# Patient Record
Sex: Male | Born: 1945 | Race: White | Hispanic: No | Marital: Married | Smoking: Former smoker
Health system: Southern US, Community
[De-identification: ages and names within clinical notes are randomized; demographics above are authoritative.]

## PROBLEM LIST (undated history)

## (undated) DIAGNOSIS — E7800 Pure hypercholesterolemia, unspecified: Secondary | ICD-10-CM

## (undated) DIAGNOSIS — N400 Enlarged prostate without lower urinary tract symptoms: Secondary | ICD-10-CM

## (undated) DIAGNOSIS — Z8589 Personal history of malignant neoplasm of other organs and systems: Secondary | ICD-10-CM

## (undated) DIAGNOSIS — Z972 Presence of dental prosthetic device (complete) (partial): Secondary | ICD-10-CM

## (undated) DIAGNOSIS — Z85828 Personal history of other malignant neoplasm of skin: Secondary | ICD-10-CM

## (undated) DIAGNOSIS — M1990 Unspecified osteoarthritis, unspecified site: Secondary | ICD-10-CM

## (undated) DIAGNOSIS — K5792 Diverticulitis of intestine, part unspecified, without perforation or abscess without bleeding: Secondary | ICD-10-CM

## (undated) DIAGNOSIS — I10 Essential (primary) hypertension: Secondary | ICD-10-CM

## (undated) HISTORY — PX: INGUINAL HERNIA REPAIR: SUR1180

## (undated) HISTORY — DX: Personal history of malignant neoplasm of other organs and systems: Z85.89

## (undated) HISTORY — DX: Diverticulitis of intestine, part unspecified, without perforation or abscess without bleeding: K57.92

## (undated) HISTORY — DX: Personal history of other malignant neoplasm of skin: Z85.828

---

## 1993-03-10 DIAGNOSIS — E7800 Pure hypercholesterolemia, unspecified: Secondary | ICD-10-CM

## 1993-03-10 HISTORY — DX: Pure hypercholesterolemia, unspecified: E78.00

## 2006-04-09 ENCOUNTER — Ambulatory Visit: Payer: Self-pay | Admitting: Gastroenterology

## 2008-02-16 ENCOUNTER — Ambulatory Visit: Payer: Self-pay | Admitting: Family Medicine

## 2008-02-22 ENCOUNTER — Ambulatory Visit: Payer: Self-pay | Admitting: Family Medicine

## 2011-11-11 DIAGNOSIS — R972 Elevated prostate specific antigen [PSA]: Secondary | ICD-10-CM | POA: Insufficient documentation

## 2011-11-11 DIAGNOSIS — N401 Enlarged prostate with lower urinary tract symptoms: Secondary | ICD-10-CM | POA: Insufficient documentation

## 2011-11-11 DIAGNOSIS — R6882 Decreased libido: Secondary | ICD-10-CM | POA: Insufficient documentation

## 2013-02-10 ENCOUNTER — Ambulatory Visit: Payer: Self-pay | Admitting: Urology

## 2013-03-10 HISTORY — PX: INGUINAL HERNIA REPAIR: SUR1180

## 2013-06-23 DIAGNOSIS — R339 Retention of urine, unspecified: Secondary | ICD-10-CM | POA: Insufficient documentation

## 2013-08-16 DIAGNOSIS — R319 Hematuria, unspecified: Secondary | ICD-10-CM | POA: Insufficient documentation

## 2013-08-16 DIAGNOSIS — E785 Hyperlipidemia, unspecified: Secondary | ICD-10-CM | POA: Insufficient documentation

## 2013-08-16 DIAGNOSIS — K635 Polyp of colon: Secondary | ICD-10-CM | POA: Insufficient documentation

## 2013-08-30 ENCOUNTER — Ambulatory Visit: Payer: Self-pay | Admitting: Surgery

## 2013-09-06 ENCOUNTER — Ambulatory Visit: Payer: Self-pay | Admitting: Surgery

## 2014-01-12 ENCOUNTER — Ambulatory Visit: Payer: Self-pay | Admitting: Gastroenterology

## 2014-05-07 ENCOUNTER — Emergency Department: Payer: Self-pay | Admitting: Internal Medicine

## 2014-07-01 NOTE — Op Note (Signed)
PATIENT NAME:  George, Bruce MR#:  [id number] DATE OF BIRTH:  [birth year]  DATE OF PROCEDURE:  09/06/2013  PREOPERATIVE DIAGNOSIS: Right inguinal hernia.   POSTOPERATIVE DIAGNOSIS: Right inguinal hernia.   PROCEDURE: Right inguinal hernia repair.   SURGEON: Rochel Brome, MD   ANESTHESIA: General.   INDICATIONS: This 69 year old male came in with chief complaint of bulging in the right groin. He had some associated pain and hernia was demonstrated on physical exam and repair recommended for definitive treatment.   DESCRIPTION OF PROCEDURE: The patient was placed on the operating table in the supine position under general anesthesia. The abdomen and the right lower quadrant was clipped and prepared with ChloraPrep, draped in a sterile manner.   A right lower quadrant transversely oriented suprapubic incision was made, carried down through subcutaneous tissues. Electrocautery was used for hemostasis. One traversing vein was divided between 4-0 chromic suture ligatures. Scarpa's fascia was incised. The external ring was identified. The external oblique aponeurosis was incised along the course of its fibers to open the external ring and expose the inguinal cord structures. Cord structures were mobilized. The floor of the inguinal canal appeared to be intact. Cremaster fibers were spread to expose a large cord lipoma which was dissected free from surrounding structures. It was approximately 4 inches in length. A high ligation of the cord lipoma was done with a 4-0 Vicryl suture ligature. The cord structures were further explored, finding an indirect hernia sac which was approximately 5 cm in length.  It was followed up and dissected up into the internal ring. The sac did contain some omentum. The sac was opened. It appeared that omentum was attached to the inner surface of the sac. There was no bowel within the sac. The sac was ligated just above the attachment of the omentum, which appeared to have a  broad   attachment. This was suture ligated with 4-0 Vicryl. A portion of the sac was excised. The remainder was allowed to retract into the abdomen. Next, the repair was carried out with a row of 0 Surgilon sutures beginning at the acute pubic tubercle, suturing the conjoined tendon to the shelving edge of the inguinal ligament incorporating transversalis fascia into the repair. The last stitch led to satisfactory narrowing of the internal ring. An onlay Bard soft mesh was cut and created an oval shape of some 2.5 x 3.5 cm. A notch was cut out to straddle the cord structures around the internal ring. This was placed along the floor of the inguinal canal and was sutured to the repair with 0 Surgilon, also sutured medially to the fascia and on both sides of the internal ring.  Repair looked good. Hemostasis was intact. The cut edges of the external oblique aponeurosis were closed with a running 4-0 Vicryl. The deep fascia superior and lateral to the repair site was infiltrated with 0.5% Sensorcaine with epinephrine and subcutaneous tissues were infiltrated as well. There was a small amount of bleeding in the subcutaneous tissues which was medial and inferior.  Pressure was held and this site was cauterized and injected additional 0.5% Sensorcaine with epinephrine and hemostasis subsequently appeared to be intact. Scarpa's fascia was closed with interrupted 4-0 Monocryl. The skin was closed with running 4-0 Monocryl subcuticular suture and Dermabond. The testicle remained in the scrotum. The patient appeared to be in satisfactory condition and was prepared for a transfer to the recovery room.    ____________________________ Lenna Sciara. Rochel Brome, MD jws:dd D: 09/06/2013 13:46:30 ET T:  09/06/2013 19:55:50 ET JOB#: [id number]  cc: Loreli Dollar, MD, <Dictator> Loreli Dollar MD ELECTRONICALLY SIGNED 09/07/2013 19:51

## 2014-09-21 ENCOUNTER — Other Ambulatory Visit: Payer: Self-pay | Admitting: Internal Medicine

## 2014-09-21 DIAGNOSIS — R1310 Dysphagia, unspecified: Secondary | ICD-10-CM

## 2014-09-27 ENCOUNTER — Ambulatory Visit
Admission: RE | Admit: 2014-09-27 | Discharge: 2014-09-27 | Disposition: A | Discharge status: Home / Self Care | Payer: Medicare Other | Source: Ambulatory Visit | Attending: Internal Medicine | Admitting: Internal Medicine

## 2014-09-27 DIAGNOSIS — R1310 Dysphagia, unspecified: Secondary | ICD-10-CM

## 2014-09-27 DIAGNOSIS — R1013 Epigastric pain: Secondary | ICD-10-CM | POA: Insufficient documentation

## 2014-09-27 DIAGNOSIS — K449 Diaphragmatic hernia without obstruction or gangrene: Secondary | ICD-10-CM | POA: Diagnosis not present

## 2014-12-21 DIAGNOSIS — Z85828 Personal history of other malignant neoplasm of skin: Secondary | ICD-10-CM

## 2014-12-21 HISTORY — DX: Personal history of other malignant neoplasm of skin: Z85.828

## 2015-05-03 DIAGNOSIS — R361 Hematospermia: Secondary | ICD-10-CM | POA: Insufficient documentation

## 2015-10-01 ENCOUNTER — Other Ambulatory Visit: Payer: Self-pay | Admitting: Surgery

## 2015-10-01 DIAGNOSIS — M7582 Other shoulder lesions, left shoulder: Secondary | ICD-10-CM

## 2015-10-01 DIAGNOSIS — M75122 Complete rotator cuff tear or rupture of left shoulder, not specified as traumatic: Secondary | ICD-10-CM

## 2015-10-12 ENCOUNTER — Ambulatory Visit
Admission: RE | Admit: 2015-10-12 | Discharge: 2015-10-12 | Disposition: A | Discharge status: Home / Self Care | Payer: Medicare Other | Source: Ambulatory Visit | Attending: Surgery | Admitting: Surgery

## 2015-10-12 DIAGNOSIS — M75122 Complete rotator cuff tear or rupture of left shoulder, not specified as traumatic: Secondary | ICD-10-CM | POA: Diagnosis present

## 2015-10-12 DIAGNOSIS — S46892A Other injury of other muscles, fascia and tendons at shoulder and upper arm level, left arm, initial encounter: Secondary | ICD-10-CM | POA: Diagnosis not present

## 2015-10-12 DIAGNOSIS — M7582 Other shoulder lesions, left shoulder: Secondary | ICD-10-CM | POA: Insufficient documentation

## 2015-10-12 DIAGNOSIS — M25412 Effusion, left shoulder: Secondary | ICD-10-CM | POA: Diagnosis not present

## 2015-11-01 ENCOUNTER — Other Ambulatory Visit: Payer: Medicare Other

## 2015-11-02 ENCOUNTER — Encounter
Admission: RE | Admit: 2015-11-02 | Discharge: 2015-11-02 | Disposition: A | Discharge status: Home / Self Care | Payer: Medicare Other | Source: Ambulatory Visit | Attending: Surgery | Admitting: Surgery

## 2015-11-02 DIAGNOSIS — Z01810 Encounter for preprocedural cardiovascular examination: Secondary | ICD-10-CM | POA: Diagnosis present

## 2015-11-02 DIAGNOSIS — Z01812 Encounter for preprocedural laboratory examination: Secondary | ICD-10-CM | POA: Insufficient documentation

## 2015-11-02 DIAGNOSIS — E7800 Pure hypercholesterolemia, unspecified: Secondary | ICD-10-CM | POA: Diagnosis not present

## 2015-11-02 HISTORY — DX: Benign prostatic hyperplasia without lower urinary tract symptoms: N40.0

## 2015-11-02 HISTORY — DX: Unspecified osteoarthritis, unspecified site: M19.90

## 2015-11-02 HISTORY — DX: Pure hypercholesterolemia, unspecified: E78.00

## 2015-11-02 LAB — BASIC METABOLIC PANEL
ANION GAP: 7 (ref 5–15)
BUN: 20 mg/dL (ref 6–20)
CHLORIDE: 106 mmol/L (ref 101–111)
CO2: 26 mmol/L (ref 22–32)
Calcium: 9.1 mg/dL (ref 8.9–10.3)
Creatinine, Ser: 0.82 mg/dL (ref 0.61–1.24)
GFR calc Af Amer: >60 mL/min (ref >60)
GLUCOSE: 91 mg/dL (ref 65–99)
POTASSIUM: 4 mmol/L (ref 3.5–5.1)
Sodium: 139 mmol/L (ref 135–145)

## 2015-11-02 LAB — SURGICAL PCR SCREEN
MRSA, PCR: NEGATIVE
STAPHYLOCOCCUS AUREUS: POSITIVE — AB

## 2015-11-02 LAB — URINALYSIS COMPLETE WITH MICROSCOPIC (ARMC ONLY)
BILIRUBIN URINE: NEGATIVE
Bacteria, UA: NONE SEEN
Glucose, UA: NEGATIVE mg/dL
Hgb urine dipstick: NEGATIVE
KETONES UR: NEGATIVE mg/dL
LEUKOCYTES UA: NEGATIVE
Nitrite: NEGATIVE
PROTEIN: NEGATIVE mg/dL
SPECIFIC GRAVITY, URINE: 1.011 (ref 1.005–1.030)
SQUAMOUS EPITHELIAL / LPF: NONE SEEN
pH: 6 (ref 5.0–8.0)

## 2015-11-02 LAB — TYPE AND SCREEN
ABO/RH(D): O NEG
Antibody Screen: NEGATIVE

## 2015-11-02 LAB — CBC
HCT: 43.8 % (ref 40.0–52.0)
HEMOGLOBIN: 15 g/dL (ref 13.0–18.0)
MCH: 29.5 pg (ref 26.0–34.0)
MCHC: 34.4 g/dL (ref 32.0–36.0)
MCV: 86 fL (ref 80.0–100.0)
Platelets: 154 10*3/uL (ref 150–440)
RBC: 5.09 MIL/uL (ref 4.40–5.90)
RDW: 13.6 % (ref 11.5–14.5)
WBC: 6.2 10*3/uL (ref 3.8–10.6)

## 2015-11-02 LAB — PROTIME-INR
INR: 1.09
PROTHROMBIN TIME: 14.1 s (ref 11.4–15.2)

## 2015-11-02 NOTE — Pre-Procedure Instructions (Signed)
Pt has confirmed his change in surgery time to 1030 and will arrive at [facility] at 0930 on Monday 11/05/15.

## 2015-11-02 NOTE — Pre-Procedure Instructions (Signed)
Nasal swab results faxed to Dr. Nicholaus Bloom office, MRSA negative and Staph Aureus positive. Message left on Cindy's voicemail.

## 2015-11-02 NOTE — Patient Instructions (Signed)
  Your procedure is scheduled [id number] November 06, 2015. Report to Same Day Surgery. To find out your arrival time please call [phone] between 1PM - 3PM on Monday November 05, 2015.  Remember: Instructions that are not followed completely may result in serious medical risk, up to and including death, or upon the discretion of your surgeon and anesthesiologist your surgery may need to be rescheduled.    _x___ 1. Do not eat food or drink liquids after midnight. No gum chewing or hard candies.     _x___ 2. No Alcohol for 24 hours before or after surgery.   ____ 3. Bring all medications with you on the day of surgery if instructed.    __x__ 4. Notify your doctor if there is any change in your medical condition     (cold, fever, infections).     Do not wear jewelry, make-up, hairpins, clips or nail polish.  Do not wear lotions, powders, or perfumes. You may wear deodorant.  Do not shave 48 hours prior to surgery. Men may shave face and neck.  Do not bring valuables to the hospital.    [facility] is not responsible for any belongings or valuables.               Contacts, dentures or bridgework may not be worn into surgery.  Leave your suitcase in the car. After surgery it may be brought to your room.  For patients admitted to the hospital, discharge time is determined by your treatment team.   Patients discharged the day of surgery will not be allowed to drive home.    Please read over the following fact sheets that you were given:   [facility] Preparing for Surgery  ____ Take these medicines the morning of surgery with A SIP OF WATER: NONE     ____ Fleet Enema (as directed)   __x__ Use CHG Soap as directed on instruction sheet  ____ Use inhalers on the day of surgery and bring to hospital day of surgery  ____ Stop metformin 2 days prior to surgery    ____ Take 1/2 of usual insulin dose the night before surgery and none on the morning of surgery.   __x__ Pt has already  stopped aspirin on 10/28/15.  __x__ Stop Anti-inflammatories such as Advil, Aleve, Ibuprofen, Motrin, Naproxen, Naprosyn, Goodies powders or aspirin products.   ____ Stop supplements until after surgery.    ____ Bring C-Pap to the hospital.

## 2015-11-03 LAB — URINE CULTURE
Culture: NO GROWTH
Special Requests: NORMAL

## 2015-11-06 ENCOUNTER — Inpatient Hospital Stay: Payer: Medicare Other | Admitting: Anesthesiology

## 2015-11-06 ENCOUNTER — Inpatient Hospital Stay: Payer: Medicare Other

## 2015-11-06 ENCOUNTER — Encounter
Admission: RE | Disposition: A | Discharge status: Home / Self Care | Payer: Self-pay | Source: Ambulatory Visit | Attending: Surgery

## 2015-11-06 ENCOUNTER — Inpatient Hospital Stay
Admission: RE | Admit: 2015-11-06 | Discharge: 2015-11-07 | DRG: 483 | Disposition: A | Discharge status: Home / Self Care | Payer: Medicare Other | Source: Ambulatory Visit | Attending: Surgery | Admitting: Surgery

## 2015-11-06 ENCOUNTER — Encounter: Payer: Self-pay | Admitting: *Deleted

## 2015-11-06 DIAGNOSIS — Z7982 Long term (current) use of aspirin: Secondary | ICD-10-CM | POA: Diagnosis not present

## 2015-11-06 DIAGNOSIS — Z87891 Personal history of nicotine dependence: Secondary | ICD-10-CM

## 2015-11-06 DIAGNOSIS — M19012 Primary osteoarthritis, left shoulder: Secondary | ICD-10-CM | POA: Diagnosis present

## 2015-11-06 DIAGNOSIS — Z79899 Other long term (current) drug therapy: Secondary | ICD-10-CM

## 2015-11-06 DIAGNOSIS — E7800 Pure hypercholesterolemia, unspecified: Secondary | ICD-10-CM | POA: Diagnosis present

## 2015-11-06 DIAGNOSIS — Z8249 Family history of ischemic heart disease and other diseases of the circulatory system: Secondary | ICD-10-CM | POA: Diagnosis not present

## 2015-11-06 DIAGNOSIS — M779 Enthesopathy, unspecified: Secondary | ICD-10-CM | POA: Diagnosis present

## 2015-11-06 DIAGNOSIS — Z96612 Presence of left artificial shoulder joint: Secondary | ICD-10-CM

## 2015-11-06 DIAGNOSIS — N400 Enlarged prostate without lower urinary tract symptoms: Secondary | ICD-10-CM | POA: Diagnosis present

## 2015-11-06 DIAGNOSIS — Z96619 Presence of unspecified artificial shoulder joint: Secondary | ICD-10-CM

## 2015-11-06 DIAGNOSIS — Z833 Family history of diabetes mellitus: Secondary | ICD-10-CM | POA: Diagnosis not present

## 2015-11-06 DIAGNOSIS — M75122 Complete rotator cuff tear or rupture of left shoulder, not specified as traumatic: Principal | ICD-10-CM | POA: Diagnosis present

## 2015-11-06 HISTORY — PX: REVERSE SHOULDER ARTHROPLASTY: SHX5054

## 2015-11-06 SURGERY — ARTHROPLASTY, SHOULDER, TOTAL, REVERSE
Anesthesia: General | Site: Shoulder | Laterality: Left | Wound class: Clean

## 2015-11-06 MED ORDER — ONDANSETRON HCL 4 MG/2ML IJ SOLN: 4.0000 mg | Freq: Once | INTRAMUSCULAR | Status: DC | PRN

## 2015-11-06 MED ORDER — HYDROMORPHONE HCL 1 MG/ML IJ SOLN
INTRAMUSCULAR | Status: DC | PRN
  Administered 2015-11-06: 1 mg via INTRAVENOUS

## 2015-11-06 MED ORDER — CEFAZOLIN SODIUM-DEXTROSE 2-4 GM/100ML-% IV SOLN
2.0000 g | Freq: Four times a day (QID) | INTRAVENOUS | Status: AC
  Administered 2015-11-06 – 2015-11-07 (×3): 2 g via INTRAVENOUS
  Filled 2015-11-06 (×3): qty 100

## 2015-11-06 MED ORDER — NEOMYCIN-POLYMYXIN B GU 40-200000 IR SOLN
Status: DC | PRN
  Administered 2015-11-06: 4 mL

## 2015-11-06 MED ORDER — BUPIVACAINE-EPINEPHRINE (PF) 0.5% -1:200000 IJ SOLN
INTRAMUSCULAR | Status: DC | PRN
  Administered 2015-11-06: 30 mL

## 2015-11-06 MED ORDER — FENTANYL CITRATE (PF) 100 MCG/2ML IJ SOLN: 25.0000 ug | INTRAMUSCULAR | Status: DC | PRN

## 2015-11-06 MED ORDER — ACETAMINOPHEN 325 MG PO TABS: 650.0000 mg | ORAL_TABLET | Freq: Four times a day (QID) | ORAL | Status: DC | PRN

## 2015-11-06 MED ORDER — OXYCODONE HCL 5 MG PO TABS
5.0000 mg | ORAL_TABLET | ORAL | Status: DC | PRN
  Administered 2015-11-07 (×2): 10 mg via ORAL
  Filled 2015-11-06 (×2): qty 2

## 2015-11-06 MED ORDER — ONDANSETRON HCL 4 MG/2ML IJ SOLN
INTRAMUSCULAR | Status: AC
  Administered 2015-11-06: 4 mg
  Filled 2015-11-06: qty 2

## 2015-11-06 MED ORDER — BUPIVACAINE-EPINEPHRINE (PF) 0.5% -1:200000 IJ SOLN
INTRAMUSCULAR | Status: AC
  Filled 2015-11-06: qty 30

## 2015-11-06 MED ORDER — FAMOTIDINE 20 MG PO TABS
20.0000 mg | ORAL_TABLET | Freq: Once | ORAL | Status: AC
  Administered 2015-11-06: 20 mg via ORAL

## 2015-11-06 MED ORDER — METOCLOPRAMIDE HCL 5 MG/ML IJ SOLN: 5.0000 mg | Freq: Three times a day (TID) | INTRAMUSCULAR | Status: DC | PRN

## 2015-11-06 MED ORDER — MIDAZOLAM HCL 5 MG/ML IJ SOLN: 2.0000 mg | Freq: Once | INTRAMUSCULAR | Status: AC

## 2015-11-06 MED ORDER — ACETAMINOPHEN 10 MG/ML IV SOLN
INTRAVENOUS | Status: DC | PRN
  Administered 2015-11-06: 1000 mg via INTRAVENOUS

## 2015-11-06 MED ORDER — FENTANYL CITRATE (PF) 100 MCG/2ML IJ SOLN
INTRAMUSCULAR | Status: DC | PRN
  Administered 2015-11-06: 300 ug via INTRAVENOUS

## 2015-11-06 MED ORDER — MIDAZOLAM HCL 5 MG/5ML IJ SOLN
INTRAMUSCULAR | Status: AC
  Administered 2015-11-06: 2 mg
  Filled 2015-11-06: qty 5

## 2015-11-06 MED ORDER — PANTOPRAZOLE SODIUM 40 MG PO TBEC
40.0000 mg | DELAYED_RELEASE_TABLET | Freq: Every day | ORAL | Status: DC
  Administered 2015-11-07: 40 mg via ORAL
  Filled 2015-11-06: qty 1

## 2015-11-06 MED ORDER — TRANEXAMIC ACID 1000 MG/10ML IV SOLN
INTRAVENOUS | Status: AC
  Filled 2015-11-06: qty 10

## 2015-11-06 MED ORDER — PHENYLEPHRINE HCL 10 MG/ML IJ SOLN
INTRAVENOUS | Status: DC | PRN
  Administered 2015-11-06: 40 ug/min via INTRAVENOUS
  Administered 2015-11-06: 35 ug/min via INTRAVENOUS

## 2015-11-06 MED ORDER — SODIUM CHLORIDE 0.9 % IJ SOLN
INTRAMUSCULAR | Status: AC
  Filled 2015-11-06: qty 50

## 2015-11-06 MED ORDER — DEXAMETHASONE SODIUM PHOSPHATE 4 MG/ML IJ SOLN
INTRAMUSCULAR | Status: DC | PRN
  Administered 2015-11-06: 5 mg via INTRAVENOUS

## 2015-11-06 MED ORDER — ASPIRIN EC 81 MG PO TBEC: 81.0000 mg | DELAYED_RELEASE_TABLET | Freq: Every evening | ORAL | Status: DC

## 2015-11-06 MED ORDER — ROPIVACAINE HCL 5 MG/ML IJ SOLN
INTRAMUSCULAR | Status: DC | PRN
  Administered 2015-11-06: 30 mL via EPIDURAL

## 2015-11-06 MED ORDER — MAGNESIUM HYDROXIDE 400 MG/5ML PO SUSP
30.0000 mL | Freq: Every day | ORAL | Status: DC | PRN
  Filled 2015-11-06: qty 30

## 2015-11-06 MED ORDER — SUGAMMADEX SODIUM 200 MG/2ML IV SOLN
INTRAVENOUS | Status: DC | PRN
  Administered 2015-11-06: 200 mg via INTRAVENOUS

## 2015-11-06 MED ORDER — KCL IN DEXTROSE-NACL 20-5-0.9 MEQ/L-%-% IV SOLN
INTRAVENOUS | Status: DC
  Administered 2015-11-06: 100 mL/h via INTRAVENOUS
  Administered 2015-11-07: 01:00:00 via INTRAVENOUS
  Filled 2015-11-06 (×4): qty 1000

## 2015-11-06 MED ORDER — ROCURONIUM BROMIDE 100 MG/10ML IV SOLN
INTRAVENOUS | Status: DC | PRN
  Administered 2015-11-06 (×2): 20 mg via INTRAVENOUS
  Administered 2015-11-06: 50 mg via INTRAVENOUS

## 2015-11-06 MED ORDER — ACETAMINOPHEN 650 MG RE SUPP: 650.0000 mg | Freq: Four times a day (QID) | RECTAL | Status: DC | PRN

## 2015-11-06 MED ORDER — CEFAZOLIN SODIUM-DEXTROSE 2-4 GM/100ML-% IV SOLN
2.0000 g | Freq: Once | INTRAVENOUS | Status: AC
  Administered 2015-11-06: 2 g via INTRAVENOUS

## 2015-11-06 MED ORDER — CEFAZOLIN SODIUM-DEXTROSE 2-4 GM/100ML-% IV SOLN
INTRAVENOUS | Status: AC
  Filled 2015-11-06: qty 100

## 2015-11-06 MED ORDER — ROPIVACAINE HCL 2 MG/ML IJ SOLN
INTRAMUSCULAR | Status: AC
  Filled 2015-11-06: qty 20

## 2015-11-06 MED ORDER — FAMOTIDINE 20 MG PO TABS
ORAL_TABLET | ORAL | Status: AC
  Filled 2015-11-06: qty 1

## 2015-11-06 MED ORDER — ONDANSETRON HCL 4 MG/2ML IJ SOLN
INTRAMUSCULAR | Status: DC | PRN
  Administered 2015-11-06: 4 mg via INTRAVENOUS

## 2015-11-06 MED ORDER — FENTANYL CITRATE (PF) 100 MCG/2ML IJ SOLN
50.0000 ug | Freq: Once | INTRAMUSCULAR | Status: AC
Start: 2015-11-06 — End: 2015-11-06
  Administered 2015-11-06: 50 ug via INTRAVENOUS

## 2015-11-06 MED ORDER — HYDROMORPHONE HCL 1 MG/ML IJ SOLN
1.0000 mg | INTRAMUSCULAR | Status: DC | PRN
  Administered 2015-11-06 – 2015-11-07 (×3): 1 mg via INTRAVENOUS
  Filled 2015-11-06: qty 2
  Filled 2015-11-06 (×2): qty 1

## 2015-11-06 MED ORDER — LIDOCAINE HCL (PF) 4 % IJ SOLN
INTRAMUSCULAR | Status: DC | PRN
  Administered 2015-11-06: 4 mL via RESPIRATORY_TRACT

## 2015-11-06 MED ORDER — DOCUSATE SODIUM 100 MG PO CAPS
100.0000 mg | ORAL_CAPSULE | Freq: Two times a day (BID) | ORAL | Status: DC
  Administered 2015-11-06 – 2015-11-07 (×2): 100 mg via ORAL
  Filled 2015-11-06 (×2): qty 1

## 2015-11-06 MED ORDER — FLEET ENEMA 7-19 GM/118ML RE ENEM: 1.0000 | ENEMA | Freq: Once | RECTAL | Status: DC | PRN

## 2015-11-06 MED ORDER — SODIUM CHLORIDE 0.9 % IV SOLN
INTRAVENOUS | Status: DC | PRN
  Administered 2015-11-06: 60 mL

## 2015-11-06 MED ORDER — SIMVASTATIN 20 MG PO TABS: 40.0000 mg | ORAL_TABLET | Freq: Every day | ORAL | Status: DC

## 2015-11-06 MED ORDER — ACETAMINOPHEN 10 MG/ML IV SOLN
INTRAVENOUS | Status: AC
  Filled 2015-11-06: qty 100

## 2015-11-06 MED ORDER — PROMETHAZINE HCL 25 MG/ML IJ SOLN
12.5000 mg | Freq: Once | INTRAMUSCULAR | Status: AC
Start: 2015-11-06 — End: 2015-11-06
  Administered 2015-11-06: 12.5 mg via INTRAVENOUS
  Filled 2015-11-06: qty 1

## 2015-11-06 MED ORDER — DIPHENHYDRAMINE HCL 12.5 MG/5ML PO ELIX
12.5000 mg | ORAL_SOLUTION | ORAL | Status: DC | PRN
Start: 2015-11-06 — End: 2015-11-07

## 2015-11-06 MED ORDER — GLYCOPYRROLATE 0.2 MG/ML IJ SOLN
INTRAMUSCULAR | Status: DC | PRN
  Administered 2015-11-06: 0.2 mg via INTRAVENOUS

## 2015-11-06 MED ORDER — ONDANSETRON HCL 4 MG/2ML IJ SOLN: 4.0000 mg | Freq: Four times a day (QID) | INTRAMUSCULAR | Status: DC | PRN

## 2015-11-06 MED ORDER — METOCLOPRAMIDE HCL 10 MG PO TABS: 5.0000 mg | ORAL_TABLET | Freq: Three times a day (TID) | ORAL | Status: DC | PRN

## 2015-11-06 MED ORDER — ONDANSETRON HCL 4 MG PO TABS: 4.0000 mg | ORAL_TABLET | Freq: Four times a day (QID) | ORAL | Status: DC | PRN

## 2015-11-06 MED ORDER — FENTANYL CITRATE (PF) 100 MCG/2ML IJ SOLN
INTRAMUSCULAR | Status: AC
  Administered 2015-11-06: 50 ug via INTRAVENOUS
  Filled 2015-11-06: qty 2

## 2015-11-06 MED ORDER — LACTATED RINGERS IV SOLN
INTRAVENOUS | Status: DC
  Administered 2015-11-06 (×3): via INTRAVENOUS

## 2015-11-06 MED ORDER — EPHEDRINE SULFATE 50 MG/ML IJ SOLN
INTRAMUSCULAR | Status: DC | PRN
  Administered 2015-11-06: 5 mg via INTRAVENOUS

## 2015-11-06 MED ORDER — ACETAMINOPHEN 500 MG PO TABS
1000.0000 mg | ORAL_TABLET | Freq: Four times a day (QID) | ORAL | Status: AC
  Administered 2015-11-06 – 2015-11-07 (×2): 1000 mg via ORAL
  Filled 2015-11-06 (×2): qty 2

## 2015-11-06 MED ORDER — TRANEXAMIC ACID 1000 MG/10ML IV SOLN
INTRAVENOUS | Status: DC | PRN
  Administered 2015-11-06: 1000 mg via INTRAVENOUS

## 2015-11-06 MED ORDER — PROPOFOL 10 MG/ML IV BOLUS
INTRAVENOUS | Status: DC | PRN
  Administered 2015-11-06: 30 mg via INTRAVENOUS
  Administered 2015-11-06: 180 mg via INTRAVENOUS

## 2015-11-06 MED ORDER — LIDOCAINE HCL (PF) 1 % IJ SOLN
INTRAMUSCULAR | Status: AC
  Filled 2015-11-06: qty 5

## 2015-11-06 MED ORDER — BUPIVACAINE LIPOSOME 1.3 % IJ SUSP
INTRAMUSCULAR | Status: AC
  Filled 2015-11-06: qty 20

## 2015-11-06 MED ORDER — LIDOCAINE HCL (CARDIAC) 20 MG/ML IV SOLN
INTRAVENOUS | Status: DC | PRN
  Administered 2015-11-06: 60 mg via INTRAVENOUS

## 2015-11-06 MED ORDER — BISACODYL 10 MG RE SUPP: 10.0000 mg | Freq: Every day | RECTAL | Status: DC | PRN

## 2015-11-06 MED ORDER — NEOMYCIN-POLYMYXIN B GU 40-200000 IR SOLN
Status: AC
  Filled 2015-11-06: qty 20

## 2015-11-06 MED ORDER — ENOXAPARIN SODIUM 40 MG/0.4ML ~~LOC~~ SOLN
40.0000 mg | SUBCUTANEOUS | Status: DC
  Administered 2015-11-07: 40 mg via SUBCUTANEOUS
  Filled 2015-11-06: qty 0.4

## 2015-11-06 MED ORDER — PHENYLEPHRINE HCL 10 MG/ML IJ SOLN
INTRAMUSCULAR | Status: DC | PRN
  Administered 2015-11-06: 25 ug via INTRAVENOUS
  Administered 2015-11-06 (×3): 100 ug via INTRAVENOUS

## 2015-11-06 MED ORDER — TAMSULOSIN HCL 0.4 MG PO CAPS
0.4000 mg | ORAL_CAPSULE | Freq: Every day | ORAL | Status: DC
  Administered 2015-11-07: 0.4 mg via ORAL
  Filled 2015-11-06: qty 1

## 2015-11-06 SURGICAL SUPPLY — 62 items
BIT DRILL TWIST 2.7 (BIT) ×2 IMPLANT
BIT DRILL TWIST 2.7MM (BIT) ×1
BLADE SAGITTAL WIDE XTHICK NO (BLADE) ×3 IMPLANT
CANISTER SUCT 1200ML W/VALVE (MISCELLANEOUS) ×3 IMPLANT
CANISTER SUCT 3000ML PPV (MISCELLANEOUS) ×6 IMPLANT
CAPT SHLDR REVTOTAL 2 ×3 IMPLANT
CATH TRAY METER 16FR LF (MISCELLANEOUS) ×3 IMPLANT
CHLORAPREP W/TINT 26ML (MISCELLANEOUS) ×3 IMPLANT
COOLER POLAR GLACIER W/PUMP (MISCELLANEOUS) ×3 IMPLANT
CRADLE LAMINECT ARM (MISCELLANEOUS) ×3 IMPLANT
DRAPE IMP U-DRAPE 54X76 (DRAPES) ×6 IMPLANT
DRAPE INCISE IOBAN 66X45 STRL (DRAPES) ×6 IMPLANT
DRAPE INCISE IOBAN 66X60 STRL (DRAPES) ×3 IMPLANT
DRAPE SHEET LG 3/4 BI-LAMINATE (DRAPES) ×6 IMPLANT
DRAPE TABLE BACK 80X90 (DRAPES) ×3 IMPLANT
DRSG OPSITE POSTOP 4X8 (GAUZE/BANDAGES/DRESSINGS) ×3 IMPLANT
ELECT CAUTERY BLADE 6.4 (BLADE) ×3 IMPLANT
GLOVE BIO SURGEON STRL SZ7.5 (GLOVE) ×12 IMPLANT
GLOVE BIO SURGEON STRL SZ8 (GLOVE) ×12 IMPLANT
GLOVE BIOGEL PI IND STRL 8 (GLOVE) ×4 IMPLANT
GLOVE BIOGEL PI INDICATOR 8 (GLOVE) ×8
GLOVE INDICATOR 8.0 STRL GRN (GLOVE) ×3 IMPLANT
GOWN STRL REUS W/ TWL LRG LVL3 (GOWN DISPOSABLE) ×3 IMPLANT
GOWN STRL REUS W/ TWL XL LVL3 (GOWN DISPOSABLE) ×1 IMPLANT
GOWN STRL REUS W/TWL LRG LVL3 (GOWN DISPOSABLE) ×6
GOWN STRL REUS W/TWL XL LVL3 (GOWN DISPOSABLE) ×2
HANDPIECE INTERPULSE COAX TIP (DISPOSABLE) ×2
HOOD PEEL AWAY FLYTE STAYCOOL (MISCELLANEOUS) ×9 IMPLANT
KIT RM TURNOVER STRD PROC AR (KITS) ×3 IMPLANT
KIT STABILIZATION SHOULDER (MISCELLANEOUS) ×3 IMPLANT
MASK FACE SPIDER DISP (MASK) ×3 IMPLANT
NDL MAYO CATGUT SZ1 (NEEDLE)
NDL MAYO CATGUT SZ5 (NEEDLE)
NDL SAFETY 22GX1.5 (NEEDLE) ×3 IMPLANT
NDL SUT 5 .5 CRC TPR PNT MAYO (NEEDLE) IMPLANT
NEEDLE 18GX1X1/2 (RX/OR ONLY) (NEEDLE) ×3 IMPLANT
NEEDLE HYPO 25X1 1.5 SAFETY (NEEDLE) ×3 IMPLANT
NEEDLE MAYO CATGUT SZ1 (NEEDLE) IMPLANT
NEEDLE MAYO CATGUT SZ4 (NEEDLE) IMPLANT
NEEDLE SPNL 20GX3.5 QUINCKE YW (NEEDLE) ×3 IMPLANT
NS IRRIG 1000ML POUR BTL (IV SOLUTION) ×3 IMPLANT
PACK ARTHROSCOPY SHOULDER (MISCELLANEOUS) ×3 IMPLANT
PAD WRAPON POLAR SHDR UNIV (MISCELLANEOUS) ×1 IMPLANT
PIN THREADED REVERSE (PIN) ×3 IMPLANT
SET HNDPC FAN SPRY TIP SCT (DISPOSABLE) ×1 IMPLANT
SLING ULTRA II M (MISCELLANEOUS) ×3 IMPLANT
SOL .9 NS 3000ML IRR  AL (IV SOLUTION) ×2
SOL .9 NS 3000ML IRR UROMATIC (IV SOLUTION) ×1 IMPLANT
SPONGE LAP 18X18 5 PK (GAUZE/BANDAGES/DRESSINGS) ×3 IMPLANT
STAPLER SKIN PROX 35W (STAPLE) ×3 IMPLANT
SUT ETHIBOND 0 MO6 C/R (SUTURE) ×3 IMPLANT
SUT ETHIBOND NAB CT1 #1 30IN (SUTURE) IMPLANT
SUT FIBERWIRE #2 38 BLUE 1/2 (SUTURE) ×6
SUT VIC AB 0 CT1 36 (SUTURE) ×3 IMPLANT
SUT VIC AB 2-0 CT1 27 (SUTURE) ×4
SUT VIC AB 2-0 CT1 TAPERPNT 27 (SUTURE) ×2 IMPLANT
SUTURE FIBERWR #2 38 BLUE 1/2 (SUTURE) ×2 IMPLANT
SYR 30ML LL (SYRINGE) ×6 IMPLANT
SYRINGE 10CC LL (SYRINGE) ×6 IMPLANT
TUBE CONNECTING  6'X3/16 (MISCELLANEOUS) ×1
TUBE CONNECTING 6X3/16 (MISCELLANEOUS) ×2 IMPLANT
WRAPON POLAR PAD SHDR UNIV (MISCELLANEOUS) ×3

## 2015-11-06 NOTE — Op Note (Addendum)
11/06/2015  10:39 AM  Patient:   George Bruce  Pre-Op Diagnosis:   Massive irreparable rotator cuff tear with cuff arthropathy, left shoulder.  Post-Op Diagnosis:   Same.  Procedure:   Reverse left total shoulder arthroplasty.  Surgeon:   Pascal Lux, MD  Assistant:   Cameron Proud, PA-C  Anesthesia:   General endotracheal with an interscalene block placed preoperatively by the anesthesiologist.  Findings:   As above.  Complications:   None  EBL:  500 cc  Fluids:   2000 cc crystalloid  UOP:  320 cc  TT:   None  Drains:   None  Closure:   Staples  Implants:   All press-fit Biomet Comprehensive system with a #13 mini-humeral stem, a 44 mm humeral tray with a standard insert, and a standard base plate with a 36 mm glenosphere.  Brief Clinical Note:   The patient is a 70 year old male with a long history of gradually worsening left shoulder pain and weakness. His history and examination were consistent with large rotator cuff tear. An MRI scan demonstrated a massive irreparable rotator cuff tear with progressive cuff arthropathy. The patient presents at this time for a reverse left total shoulder arthroplasty.  Procedure:   The patient underwent placement of an interscalene block in the preoperative holding area before he was brought into the operating room and lain in the supine position on the OR table. After adequate general endotracheal intubation and anesthesia was obtained, a Foley catheter was inserted and the patient repositioned in the beach chair position using the beach chair positioner. The left shoulder and upper extremity were prepped with ChloraPrep solution before being draped sterilely. Preoperative antibiotics were administered. A standard anterior approach to the shoulder was made through an approximately 4-5 inch incision. The incision was carried down through the subcutaneous tissues to expose the deltopectoral fascia. The interval between the deltoid and  pectoralis muscles was identified and this plane developed, retracting the cephalic vein laterally with the deltoid muscle. The conjoined tendon was identified. Its lateral margin was dissected and the Kolbel self-retraining retractor inserted. The "three sisters" were identified and tied off. Bursal tissues were removed to improve visualization. The subscapularis tendon was noted to be chronically torn and retracted, so the anterior capsule was released from its attachment to the lesser tuberosity and several tagging sutures placed. The inferior capsule was released with care after identifying and protecting the axillary nerve. The proximal humeral cut was made at approximately 30 of retroversion using the extra-medullary guide.   Attention was redirected to the glenoid. The labrum was debrided circumferentially before the center of the glenoid was marked with electrocautery. The guidewire was drilled into the glenoid neck using the appropriate guide. After verifying its position, it was overreamed with the standard baseplate reamer to create a flat surface. The permanent standard baseplate was impacted into place. It was stabilized with a 25 x 6.5 mm central screw and four peripheral screws. Locking screws were placed superiorly and inferiorly while nonlocking screws were placed anteriorly and posteriorly. The permanent 36 mm glenosphere was then impacted into place and its Morse taper locking mechanism verified using manual distraction.  Attention was directed to the humeral side. The humeral canal was reamed sequentially beginning with the end-cutting reamer then progressing from a 4 mm reamer up to a 13 mm reamer. This provided excellent circumferential chatter. The canal was broached beginning with a #11 broach and progressing to a #13 broach. This was left in place  and a trial reduction performed using the standard trial humeral platform. The arm demonstrated excellent range of motion as the hand could  be brought across the chest to the opposite shoulder and brought to the top of the patient's head and to the patient's ear. The shoulder appeared stable throughout this range of motion. The joint was dislocated and the trial components removed. The permanent #13 mini-stem was impacted into place with care taken to maintain the appropriate version. The permanent 44 mm humeral platform with the standard insert was put together on the back table and impacted into place. Again, the [facility] taper locking mechanism was verified using manual distraction. The shoulder was relocated using two finger pressure and again placed through a range of motion with the findings as described above.  The wound was copiously irrigated with bacitracin saline solution using the jet lavage system before a total of 20 cc of Exparel diluted out to 60 cc with normal saline and 30 cc of 0.5% Sensorcaine with epinephrine was injected into the pericapsular and peri-incisional tissues to help with postoperative analgesia. The anterior capsular tissues were reapproximated to the lesser tuberosity through bone tunnels using #2 FiberWire interrupted sutures. The deltopectoral interval was closed using #0 Vicryl interrupted sutures before the subcutaneous tissues were closed using 2-0 Vicryl interrupted sutures. The skin was closed using staples. Prior to closing the skin, 1 g of transexemic acid in 10 cc of normal saline was injected intra-articularly to help with postoperative bleeding. A sterile occlusive dressing was applied to the wound before the arm was placed into a shoulder immobilizer with an abduction pillow. A Polar Care system also was applied to the shoulder. The patient was then transferred back to a hospital bed before being awakened, extubated, and returned to the recovery room in satisfactory condition after tolerating the procedure well.

## 2015-11-06 NOTE — Evaluation (Signed)
Physical Therapy Evaluation Patient Details Name: George Bruce MRN: [id number] DOB: 1945-07-22 Today's Date: 11/06/2015   History of Present Illness  Pt is a 70 y.o. male s/p reverse L TSA secondary to massive irreparable rotator cuff tear with cuff arthropathy 11/06/15.  Pt seen on POD #0 for eval.  Clinical Impression  Prior to admission, pt was independent with functional mobility without AD.  Pt lives with his wife in 1 level home with 2 STE with railing.  Currently pt is modified independent supine to sit; CGA with sit to stand; and CGA ambulating 15 feet no AD.  Pt with mild unsteadiness but no overt loss of balance noted with mobility.  Pt would benefit from skilled PT to address noted impairments and functional limitations.  Recommend pt discharge to home with support of family when medically appropriate.     Follow Up Recommendations  (OP PT (pending Ortho MD therapy recommendations/protocol))    Equipment Recommendations  None recommended by PT    Recommendations for Other Services       Precautions / Restrictions Precautions Precautions: Shoulder;Fall Type of Shoulder Precautions: Reverse TSA Shoulder Interventions: Shoulder sling/immobilizer;Shoulder abduction pillow Required Braces or Orthoses: Sling Restrictions Weight Bearing Restrictions: Yes LUE Weight Bearing: Non weight bearing      Mobility  Bed Mobility Overal bed mobility: Modified Independent             General bed mobility comments: Supine to sit with HOB elevated; pt needed to sit on edge of bed for a couple minutes d/t pt initially reporting dizziness upon sitting up (symptoms resolved in a couple minutes)  Transfers Overall transfer level: Needs assistance   Transfers: Sit to/from Stand Sit to Stand: Min guard         General transfer comment: increased effort to stand noted but no loss of balance  Ambulation/Gait Ambulation/Gait assistance: Min guard Ambulation Distance (Feet): 15  Feet Assistive device: None   Gait velocity: decreased   General Gait Details: decreased B step length/foot clearance/heelstrike; mild increased lateral sway B but no loss of balance requiring assist to steady  Stairs            Wheelchair Mobility    Modified Rankin (Stroke Patients Only)       Balance Overall balance assessment: Needs assistance Sitting-balance support: No upper extremity supported;Feet supported Sitting balance-Leahy Scale: Good     Standing balance support: No upper extremity supported;During functional activity Standing balance-Leahy Scale: Fair Standing balance comment: mildly unsteady with functional mobility but no overt loss of balance requiring assist to steady                             Pertinent Vitals/Pain Pain Assessment: No/denies pain  Vitals stable and WFL throughout treatment session.    Home Living Family/patient expects to be discharged to:: Private residence Living Arrangements: Spouse/significant other Available Help at Discharge: Family Type of Home: House Home Access: Stairs to enter Entrance Stairs-Rails: Right Entrance Stairs-Number of Steps: 2 Home Layout: One level Home Equipment: None      Prior Function Level of Independence: Independent         Comments: Pt denies any falls in past 6 months.     Hand Dominance        Extremity/Trunk Assessment   Upper Extremity Assessment: RUE deficits/detail;LUE deficits/detail RUE Deficits / Details: R UE strength and ROM WFL     LUE Deficits / Details: Deferred  s/p surgery (immobilized in sling); pt reporting numbness in L hand s/p surgery (nursing notified)   Lower Extremity Assessment: Overall WFL for tasks assessed      Cervical / Trunk Assessment: Normal  Communication   Communication: No difficulties  Cognition Arousal/Alertness: Awake/alert Behavior During Therapy: WFL for tasks assessed/performed Overall Cognitive Status: Within  Functional Limits for tasks assessed                      General Comments General comments (skin integrity, edema, etc.): Pt laying in bed with L shoulder immobilizer with abduction pillow and polar care in place; pt's wife present.  Nursing cleared pt for participation in physical therapy.  Pt agreeable to PT session.    Exercises        Assessment/Plan    PT Assessment Patient needs continued PT services  PT Diagnosis Difficulty walking   PT Problem List Decreased balance;Decreased mobility;Decreased knowledge of precautions  PT Treatment Interventions DME instruction;Gait training;Stair training;Functional mobility training;Therapeutic activities;Therapeutic exercise;Balance training;Patient/family education   PT Goals (Current goals can be found in the Care Plan section) Acute Rehab PT Goals Patient Stated Goal: to go home PT Goal Formulation: With patient/family Time For Goal Achievement: 11/20/15 Potential to Achieve Goals: Good    Frequency BID   Barriers to discharge        Co-evaluation               End of Session Equipment Utilized During Treatment: Gait belt;Other (comment) (L shoulder immobilizer with abduction pillow) Activity Tolerance: Patient tolerated treatment well Patient left: in chair;with call bell/phone within reach;with chair alarm set;with family/visitor present;with SCD's reapplied (polar care in place and activated; B heels elevated via towel rolls; L UE supported comfortably in shoulder immobilizer with abduction pillow) Nurse Communication: Mobility status;Precautions;Weight bearing status         Time: [id number] PT Time Calculation (min) (ACUTE ONLY): 25 min   Charges:   PT Evaluation $PT Eval Low Complexity: 1 Procedure     PT G CodesLeitha Bleak 11/18/2015, 4:53 PM Leitha Bleak, [place]

## 2015-11-06 NOTE — Progress Notes (Signed)
Patient N/V unrelieved by nursing interventions. Dr. Marry Guan notified placing new orders. Fluids infusing. No output since removal of foley bladder scan in progress. Report given to Olivia Mackie will resume care.

## 2015-11-06 NOTE — Anesthesia Procedure Notes (Addendum)
Anesthesia Regional Block:  Interscalene brachial plexus block  Pre-Anesthetic Checklist: ,, timeout performed, Correct Patient, Correct Site, Correct Laterality, Correct Procedure, Correct Position, site marked, Risks and benefits discussed,  Surgical consent,  Pre-op evaluation,  At surgeon's request and post-op pain management  Laterality: Left  Prep: chloraprep, alcohol swabs       Needles:   Needle Type: Stimiplex     Needle Length: 9cm 9 cm Needle Gauge: 22 and 22 G    Additional Needles:  Procedures: ultrasound guided (picture in chart) and nerve stimulator Interscalene brachial plexus block  Nerve Stimulator or Paresthesia:  Response: 0.6 mA,   Additional Responses:   Narrative:  Start time: 11/06/2015 7:40 AM End time: 11/06/2015 7:50 AM Injection made incrementally with aspirations every 5 mL. Anesthesiologist: Alvin Critchley  Additional Notes: Time out performed.  Roll placed under the left side of the back .  Adequate premed given.  Left neck prepped in sterile fashion.  An US probe was used to identify the nerve complex around the C6 level.  A skin wheal was made with 1% Lidocaine plain at that level lateral to the probe.  A 22G stimuplex needle was advanced toward the typical stoplight pattern of nerves with a fade of the deltoid twitch noted at 0.6 mAmps.  Easy incremental injection of 30 cc of a mix of 15 cc of 0.5% Ropivacaine and 0.25% ropivacaine plain.  Absolutely no pain on injection noted by patient.  Patient tolerated the procedure well.  Picture in chart..  Patient was somewhat mobile during block and had to be cautioned to not move his head while talking.

## 2015-11-06 NOTE — Progress Notes (Signed)
Patient was admitted to room 148 from surgery via room bed and surgery staff. Immobilizer to left arm, dressing CD&I to left shoulder. TEDs and foot pumps in place. A&O x4, but sleepy. Bed alarm on for safety. No foley in place.

## 2015-11-06 NOTE — OR Nursing (Signed)
WHEN FOLEY CATH REMOVED SMALL AMOUNT OF BLOOD NOTED ON AROUND END OF PENIS. NO RESISTANCE MET WHEN INSERTING CATHETER. DR POGGI AWARE OF BLOOD. NO FURTHER BLOOD NOTED WHEN CATHETER REMOVED. uRING CLEAR AND YELLOW.

## 2015-11-06 NOTE — H&P (Signed)
Paper H&P to be scanned into permanent record. H&P reviewed. No changes. 

## 2015-11-06 NOTE — Anesthesia Procedure Notes (Signed)
Procedure Name: Intubation Date/Time: 11/06/2015 8:10 AM Performed by: Rosaria Ferries, Even Budlong Pre-anesthesia Checklist: Patient identified, Emergency Drugs available, Suction available and Patient being monitored Patient Re-evaluated:Patient Re-evaluated prior to inductionOxygen Delivery Method: Circle system utilized Preoxygenation: Pre-oxygenation with 100% oxygen Intubation Type: IV induction Laryngoscope Size: Mac and 3 Grade View: Grade I Tube size: 7.0 mm Number of attempts: 1 Airway Equipment and Method: LTA kit utilized Placement Confirmation: ETT inserted through vocal cords under direct vision,  positive ETCO2 and breath sounds checked- equal and bilateral Secured at: 23 cm Tube secured with: Tape Dental Injury: Teeth and Oropharynx as per pre-operative assessment

## 2015-11-06 NOTE — OR Nursing (Signed)
Patient c/o hard to breathe deep and told him the block affects the diaphragm.

## 2015-11-06 NOTE — Anesthesia Preprocedure Evaluation (Addendum)
Anesthesia Evaluation  Patient identified by MRN, date of birth, ID band Patient awake    Reviewed: Allergy & Precautions, NPO status , Patient's Chart, lab work & pertinent test results  Airway Mallampati: II  TM Distance: <3 FB     Dental  (+) Partial Lower, Partial Upper   Pulmonary former smoker,    Pulmonary exam normal        Cardiovascular negative cardio ROS Normal cardiovascular exam     Neuro/Psych negative neurological ROS  negative psych ROS   GI/Hepatic negative GI ROS, Neg liver ROS,   Endo/Other  negative endocrine ROS  Renal/GU negative Renal ROS  negative genitourinary   Musculoskeletal  (+) Arthritis , Osteoarthritis,    Abdominal Normal abdominal exam  (+)   Peds negative pediatric ROS (+)  Hematology negative hematology ROS (+)   Anesthesia Other Findings   Reproductive/Obstetrics                             Anesthesia Physical Anesthesia Plan  ASA: II  Anesthesia Plan: General   Post-op Pain Management:  Regional for Post-op pain   Induction: Intravenous  Airway Management Planned: Oral ETT  Additional Equipment:   Intra-op Plan:   Post-operative Plan: Extubation in OR  Informed Consent: I have reviewed the patients History and Physical, chart, labs and discussed the procedure including the risks, benefits and alternatives for the proposed anesthesia with the patient or authorized representative who has indicated his/her understanding and acceptance.   Dental advisory given  Plan Discussed with: CRNA and Surgeon  Anesthesia Plan Comments: (Patient understands the benefits and risks of the interscalene block and wishes to proceed with the block.)       Anesthesia Quick Evaluation

## 2015-11-06 NOTE — Transfer of Care (Signed)
Immediate Anesthesia Transfer of Care Note  Patient: George Bruce  Procedure(s) Performed: Procedure(s): REVERSE SHOULDER ARTHROPLASTY (Left)  Patient Location: PACU  Anesthesia Type:General  Level of Consciousness: awake and patient cooperative  Airway & Oxygen Therapy: Patient Spontanous Breathing and Patient connected to nasal cannula oxygen  Post-op Assessment: Report given to RN and Post -op Vital signs reviewed and stable  Post vital signs: Reviewed and stable  Last Vitals:  Vitals:   11/06/15 0758 11/06/15 0801  BP: 127/83 127/83  Pulse: 67 71  Resp: 16 19  Temp:      Last Pain:  Vitals:   11/06/15 0607  TempSrc: Tympanic  PainSc: 6          Complications: No apparent anesthesia complications

## 2015-11-06 NOTE — OR Nursing (Signed)
To PACU via stretcher for block, as instructed by Dr Kayleen Memos.  Report given to [facility]

## 2015-11-06 NOTE — NC FL2 (Signed)
[place] LEVEL OF CARE SCREENING TOOL     IDENTIFICATION  Patient Name: George Bruce Birthdate: 1945/07/01 Sex: male Admission Date (Current Location): 11/06/2015  [place] Number:  Engineering geologist and Address:  [facility], [address], [place], [place] [address]      Provider Number: [id number]  Attending Physician Name and Address:  Corky Mull, MD  Relative Name and Phone Number:       Current Level of Care: Hospital Recommended Level of Care: [place] Prior Approval Number:    Date Approved/Denied:   PASRR Number:  ([id number])  Discharge Plan: SNF    Current Diagnoses: Patient Active Problem List   Diagnosis Date Noted  . 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   Rotator cuff arthropathy, left 10/24/2015  Complete tear of left rotator cuff 10/01/2015  Rotator cuff tendinitis, left 10/01/2015  Hyperlipidemia 08/16/2013  Colon polyps 08/16/2013  Elevated PSA 08/16/2013  Hematuria 08/16/2013  Hematuria     Orientation RESPIRATION BLADDER Height & Weight     Self, Time, Situation, Place  Normal Continent Weight: 186 lb (84.4 kg) Height:  5' 9.5" (176.5 cm)  BEHAVIORAL SYMPTOMS/MOOD NEUROLOGICAL BOWEL NUTRITION STATUS   (none)  (none) Continent Diet (Diet: Clear Liquid )  AMBULATORY STATUS COMMUNICATION OF NEEDS Skin   Extensive Assist Verbally Surgical wounds (Incision Left Shoulder. )                       Personal Care Assistance Level of Assistance  Bathing, Feeding, Dressing Bathing Assistance: Limited assistance Feeding assistance: Independent Dressing Assistance: Limited assistance     Functional Limitations Info  Sight, Hearing, Speech Sight Info: Adequate Hearing Info: Adequate Speech Info: Adequate    SPECIAL CARE FACTORS FREQUENCY  PT (By licensed PT), OT (By licensed OT)     PT Frequency:  (5) OT Frequency:  (5)             Contractures      Additional Factors Info  Code Status, Allergies Code Status Info:  (Full Code ) Allergies Info:  (No Known Allergies )           Current Medications (11/06/2015):  This is the current hospital active medication list Current Facility-Administered Medications  Medication Dose Route Frequency Provider Last Rate Last Dose  . acetaminophen (TYLENOL) tablet 650 mg  650 mg Oral Q6H PRN Corky Mull, MD       Or  . acetaminophen (TYLENOL) suppository 650 mg  650 mg Rectal Q6H PRN Corky Mull, MD      . acetaminophen (TYLENOL) tablet 1,000 mg  1,000 mg Oral Q6H Corky Mull, MD      . aspirin EC tablet 81 mg  81 mg Oral QPM Corky Mull, MD      . bisacodyl (DULCOLAX) suppository 10 mg  10 mg Rectal Daily PRN Corky Mull, MD      . ceFAZolin (ANCEF) 2-4 GM/100ML-% IVPB           . ceFAZolin (ANCEF) IVPB 2g/100 mL premix  2 g Intravenous Q6H Corky Mull, MD   2 g at 11/06/15 1359  . dextrose 5 % and 0.9 % NaCl with KCl 20 mEq/L infusion   Intravenous Continuous Corky Mull, MD 100 mL/hr at 11/06/15 1358 100 mL/hr at 11/06/15 1358  . diphenhydrAMINE (BENADRYL) 12.5 MG/5ML elixir 12.5-25 mg  12.5-25 mg Oral Q4H PRN  Corky Mull, MD      . docusate sodium (COLACE) capsule 100 mg  100 mg Oral BID Corky Mull, MD      . Derrill Memo ON 11/07/2015] enoxaparin (LOVENOX) injection 40 mg  40 mg Subcutaneous Q24H Corky Mull, MD      . famotidine (PEPCID) 20 MG tablet           . HYDROmorphone (DILAUDID) injection 1-2 mg  1-2 mg Intravenous Q2H PRN Corky Mull, MD      . magnesium hydroxide (MILK OF MAGNESIA) suspension 30 mL  30 mL Oral Daily PRN Corky Mull, MD      . metoCLOPramide (REGLAN) tablet 5-10 mg  5-10 mg Oral Q8H PRN Corky Mull, MD       Or  . metoCLOPramide (REGLAN) injection 5-10 mg  5-10 mg Intravenous Q8H PRN Corky Mull, MD      . ondansetron [facility]) tablet 4 mg  4 mg Oral Q6H PRN Corky Mull, MD       Or  . ondansetron [facility]) injection 4 mg  4 mg  Intravenous Q6H PRN Corky Mull, MD      . oxyCODONE (Oxy IR/ROXICODONE) immediate release tablet 5-10 mg  5-10 mg Oral Q3H PRN Corky Mull, MD      . pantoprazole (PROTONIX) EC tablet 40 mg  40 mg Oral Daily Corky Mull, MD      . ropivacaine (PF) 2 mg/ml (0.2%) (NAROPIN) 2 MG/ML epidural           . simvastatin (ZOCOR) tablet 40 mg  40 mg Oral q1800 Corky Mull, MD      . sodium phosphate (FLEET) 7-19 GM/118ML enema 1 enema  1 enema Rectal Once PRN Corky Mull, MD      . Derrill Memo ON 11/07/2015] tamsulosin (FLOMAX) capsule 0.4 mg  0.4 mg Oral QPC breakfast Corky Mull, MD         Discharge Medications: Please see discharge summary for a list of discharge medications.  Relevant Imaging Results:  Relevant Lab Results:   Additional Information  (SS: [id number])  Quanna Wittke, Veronia Beets, LCSW

## 2015-11-06 NOTE — Anesthesia Postprocedure Evaluation (Signed)
Anesthesia Post Note  Patient: George Bruce  Procedure(s) Performed: Procedure(s) (LRB): REVERSE SHOULDER ARTHROPLASTY (Left)  Patient location during evaluation: PACU Anesthesia Type: General Level of consciousness: awake and alert and oriented Pain management: pain level controlled Vital Signs Assessment: post-procedure vital signs reviewed and stable Respiratory status: spontaneous breathing Cardiovascular status: blood pressure returned to baseline Anesthetic complications: no    Last Vitals:  Vitals:   11/06/15 1447 11/06/15 1546  BP: 137/77 105/86  Pulse: 62 (!) 59  Resp: 18 18  Temp: 36.7 C     Last Pain:  Vitals:   11/06/15 1447  TempSrc: Oral  PainSc:                  Chiyoko Torrico

## 2015-11-07 ENCOUNTER — Encounter: Payer: Self-pay | Admitting: Surgery

## 2015-11-07 LAB — CBC WITH DIFFERENTIAL/PLATELET
BASOS PCT: 0 %
Basophils Absolute: 0 10*3/uL (ref 0–0.1)
EOS ABS: 0 10*3/uL (ref 0–0.7)
EOS PCT: 0 %
HCT: 38.8 % — ABNORMAL LOW (ref 40.0–52.0)
Hemoglobin: 13.3 g/dL (ref 13.0–18.0)
LYMPHS ABS: 1.2 10*3/uL (ref 1.0–3.6)
Lymphocytes Relative: 11 %
MCH: 29.7 pg (ref 26.0–34.0)
MCHC: 34.3 g/dL (ref 32.0–36.0)
MCV: 86.6 fL (ref 80.0–100.0)
MONOS PCT: 9 %
Monocytes Absolute: 1 10*3/uL (ref 0.2–1.0)
Neutro Abs: 9.4 10*3/uL — ABNORMAL HIGH (ref 1.4–6.5)
Neutrophils Relative %: 80 %
PLATELETS: 137 10*3/uL — AB (ref 150–440)
RBC: 4.48 MIL/uL (ref 4.40–5.90)
RDW: 13.6 % (ref 11.5–14.5)
WBC: 11.7 10*3/uL — AB (ref 3.8–10.6)

## 2015-11-07 LAB — BASIC METABOLIC PANEL
ANION GAP: 3 — AB (ref 5–15)
BUN: 15 mg/dL (ref 6–20)
CALCIUM: 8.9 mg/dL (ref 8.9–10.3)
CO2: 30 mmol/L (ref 22–32)
Chloride: 109 mmol/L (ref 101–111)
Creatinine, Ser: 1.02 mg/dL (ref 0.61–1.24)
GLUCOSE: 101 mg/dL — AB (ref 65–99)
Potassium: 4.2 mmol/L (ref 3.5–5.1)
Sodium: 142 mmol/L (ref 135–145)

## 2015-11-07 MED ORDER — ONDANSETRON HCL 4 MG PO TABS: 4.0000 mg | ORAL_TABLET | Freq: Four times a day (QID) | ORAL | 0 refills | Status: DC | PRN

## 2015-11-07 MED ORDER — OXYCODONE HCL 5 MG PO TABS: 5.0000 mg | ORAL_TABLET | ORAL | 0 refills | Status: DC | PRN

## 2015-11-07 MED ORDER — CHLORHEXIDINE GLUCONATE CLOTH 2 % EX PADS: 6.0000 | MEDICATED_PAD | Freq: Every day | CUTANEOUS | Status: DC

## 2015-11-07 MED ORDER — MUPIROCIN 2 % EX OINT
1.0000 "application " | TOPICAL_OINTMENT | Freq: Two times a day (BID) | CUTANEOUS | Status: DC
  Filled 2015-11-07: qty 22

## 2015-11-07 NOTE — Care Management Note (Signed)
Case Management Note  Patient Details  Name: George Bruce MRN: [id number] Date of Birth: [birth year]  Subjective/Objective:   Spoke with patient and spouse at the bedside. Patient is alert and oriented from home.  Was independent prior to shoulder surgery. Will discharge on aspirin regimen for DVT prophylaxis. Awaiting OT / PTevaluation for patient .   Patietn gets meds filled at [facility] [address].             Action/Plan: Home with Home Health.    Expected Discharge Date:  11/07/15               Expected Discharge Plan:  Home/Self Care  In-House Referral:     Discharge planning Services  CM Consult  Post Acute Care Choice:    Choice offered to:  NA  DME Arranged:  N/A DME Agency:  NA  HH Arranged:  NA HH Agency:     Status of Service:  Completed, signed off  If discussed at [place] of Stay Meetings, dates discussed:    Additional Comments:  Alvie Heidelberg, RN 11/07/2015, 9:42 AM

## 2015-11-07 NOTE — Discharge Instructions (Signed)
Shoulder Joint Replacement, Care After Refer to this sheet in the next few weeks. These instructions provide you with information on caring for yourself after your procedure. Your health care provider may also give you more specific instructions. Your treatment has been planned according to current medical practices, but problems sometimes occur. Call your health care provider if you have any problems or questions after your procedure. WHAT TO EXPECT AFTER THE PROCEDURE After your procedure, your arm and shoulder will typically be stiff and bruised. This will improve over time. HOME CARE INSTRUCTIONS   You may resume your normal diet and activities as directed by your surgeon.  You should regain full use of your shoulder in 6 weeks.  Your arm will be in a sling. You will need to wear this for 4-6 weeks after surgery.  Wear the sling every night for at least the first month, or as instructed by your surgeon.  Do not use your arm to push yourself up in bed or from a chair. This requires too much muscle.  Follow the program of home exercises suggested. Do the exercises 4-5 times a day for a month or as directed.  Try not to overuse your shoulder. Overusing the shoulder is easy to do if this is the first time you have been pain free in a long time. Early overuse of the shoulder may result in later problems.  Do not lift anything heavier than a cup of coffee for the first 6 weeks after surgery.  Ask for help. Your health care provider may be able to suggest a clinic or agency for this if you do not have home support.  Do not participate in contact sports or do any heavy lifting (more than 10 lb [4.5 kg]) for at least 6 months, or as directed.  Apply ice to the injured area for the first 2 days after surgery:  Put ice in a plastic bag.  Place a towel between your skin and the bag.  Leave the ice on for 20 minutes, 2-3 times a day.  Change dressings if necessary or as directed.  Only  take over-the-counter or prescription medicines for pain, discomfort, or fever as directed by your health care provider.  Keep all follow-up appointments as directed. SEEK MEDICAL CARE IF:  You have redness, swelling, or increasing pain in the wound.  You see pus coming from the wound.  You have a fever.  You notice a bad smell coming from the wound or dressing.  The edges of the wound break open after sutures or staples have been removed.  You have increasing pain with movement of the shoulder. SEEK IMMEDIATE MEDICAL CARE IF:   You develop a rash.  You have chest pain or shortness of breath.  You have any reaction or side effects to medicine given. MAKE SURE YOU:  Understand these instructions.  Will watch your condition.  Will get help right away if you are not doing well or get worse.   This information is not intended to replace advice given to you by your health care provider. Make sure you discuss any questions you have with your health care provider.   Document Released: 09/13/2004 Document Revised: 03/01/2013 Document Reviewed: 09/23/2012 Elsevier Interactive Patient Education [employer].

## 2015-11-07 NOTE — Progress Notes (Signed)
Physical Therapy Treatment Patient Details Name: George Bruce MRN: [id number] DOB: 02-23-1946 Today's Date: 11/07/2015    History of Present Illness Pt is a 70 y.o. male who was admitted for a Left TSA.    PT Comments    Pt presented in bed with CM present.  Discussed post d/c rehab and answered pt and wife's concerns and instructed in use of cryocuff per their request. Pt with c/o increased pain today and indicated was recently medicated. Pt able to perform supine to sit with add'l time and use of head rail however no assistance required. Pt with no c/o dizziness/nausea upon sitting. Sit to stand from bed with noted bracing of LE against bed however no increase sway nor LOB.  Pt ambulated 114f x 1 with no AD shortened step length but improving gait speed with distance.  Pt returned to room and left in chair with all needs met.   Follow Up Recommendations        Equipment Recommendations  None recommended by PT    Recommendations for Other Services       Precautions / Restrictions Precautions Precautions: Shoulder;Fall Type of Shoulder Precautions: Reverse TSA Shoulder Interventions: Shoulder sling/immobilizer;Shoulder abduction pillow Required Braces or Orthoses: Sling Restrictions Weight Bearing Restrictions: Yes RUE Weight Bearing: Weight bearing as tolerated LUE Weight Bearing: Non weight bearing    Mobility  Bed Mobility Overal bed mobility: Modified Independent             General bed mobility comments: Supine to sit at EOB with HOB elevated, add'l time required 2/2 pain   Transfers Overall transfer level: Needs assistance Equipment used: None Transfers: Stand Pivot Transfers Sit to Stand: Supervision         General transfer comment: Braced LE against bed however no LOB or unsteadiness noted  Ambulation/Gait Ambulation/Gait assistance: Supervision Ambulation Distance (Feet): 180 Feet Assistive device: None   Gait velocity: decreased   General  Gait Details: decreased step length    Stairs            Wheelchair Mobility    Modified Rankin (Stroke Patients Only)       Balance Overall balance assessment: Needs assistance Sitting-balance support: No upper extremity supported Sitting balance-Leahy Scale: Good     Standing balance support: No upper extremity supported Standing balance-Leahy Scale: Good Standing balance comment: Pt able to static stand in room without UE support demonstrating no increased sway/lean or LOB                     Cognition Arousal/Alertness: Awake/alert Behavior During Therapy: WFL for tasks assessed/performed Overall Cognitive Status: Within Functional Limits for tasks assessed                      Exercises      General Comments        Pertinent Vitals/Pain Pain Assessment: 0-10 Pain Score: 9  Pain Descriptors / Indicators: Aching;Operative site guarding;Sore Pain Intervention(s): Limited activity within patient's tolerance;Monitored during session    HFultonexpects to be discharged to:: Private residence Living Arrangements: Spouse/significant other Available Help at Discharge: Family Type of Home: House Home Access: Stairs to enter Entrance Stairs-Rails: Right Home Layout: One level Home Equipment: Hand held shower head      Prior Function Level of Independence: Independent          PT Goals (current goals can now be found in the care plan section) Acute Rehab PT Goals  Patient Stated Goal: To return home PT Goal Formulation: With patient/family Time For Goal Achievement: 11/20/15 Potential to Achieve Goals: Good Progress towards PT goals: Progressing toward goals    Frequency  BID    PT Plan      Co-evaluation             End of Session Equipment Utilized During Treatment: Gait belt Activity Tolerance: Patient tolerated treatment well Patient left: in chair;with call bell/phone within reach;with chair alarm  set;with family/visitor present     Time: 0902-0925 PT Time Calculation (min) (ACUTE ONLY): 23 min  Charges:  $Therapeutic Activity: 23-37 mins                    G Codes:      Dionisio Aragones 2015/11/25, 11:27 AM  Jenese Mischke, PTA

## 2015-11-07 NOTE — Progress Notes (Signed)
Clinical Education officer, museum (CSW) received SNF consult. PT is not recommending SNF. RN Case Manager is aware of above. Please reconsult if future social work needs arise. CSW signing off.   [employer], LCSW [phone]

## 2015-11-07 NOTE — Care Management Important Message (Signed)
Important Message  Patient Details  Name: George Bruce MRN: [id number] Date of Birth: 22-Aug-1945   Medicare Important Message Given:  N/A - LOS <3 / Initial given by admissions    Alvie Heidelberg, RN 11/07/2015, 11:47 AM

## 2015-11-07 NOTE — Care Management (Signed)
Spoke with [place] PA regarding OT follow up recommendation for outpatient OT but appointment at [facility] not until 9/18. Per [place] it would be best for patient to been seen by OT a week from today. App't made (not changed) for 9/6 at 1:30PM. Patient informed/agrees- placed on discharge follow up note. No further RNCM needs.

## 2015-11-07 NOTE — Progress Notes (Signed)
Subjective: 1 Day Post-Op Procedure(s) (LRB): REVERSE SHOULDER ARTHROPLASTY (Left) Patient reports pain as mild.   Patient is well, and has had no acute complaints or problems Plan is to go Home after hospital stay. Negative for chest pain and shortness of breath Fever: no Gastrointestinal:Negative for nausea and vomiting  Objective: Vital signs in last 24 hours: Temp:  [97 F (36.1 C)-98.7 F (37.1 C)] 98.7 F (37.1 C) (08/30 0734) Pulse Rate:  [59-77] 60 (08/30 0734) Resp:  [10-22] 16 (08/30 0734) BP: (105-143)/(54-90) 109/54 (08/30 0734) SpO2:  [92 %-100 %] 97 % (08/30 0734)  Intake/Output from previous day:  Intake/Output Summary (Last 24 hours) at 11/07/15 0747 Last data filed at 11/07/15 0620  Gross per 24 hour  Intake             4560 ml  Output             2970 ml  Net             1590 ml    Intake/Output this shift: No intake/output data recorded.  Labs:  Recent Labs  11/07/15 0432  HGB 13.3    Recent Labs  11/07/15 0432  WBC 11.7*  RBC 4.48  HCT 38.8*  PLT 137*    Recent Labs  11/07/15 0432  NA 142  K 4.2  CL 109  CO2 30  BUN 15  CREATININE 1.02  GLUCOSE 101*  CALCIUM 8.9   No results for input(s): LABPT, INR in the last 72 hours.   EXAM General - Patient is Alert, Appropriate and Oriented Extremity - ABD soft Sensation intact distally Incision: dressing C/D/I No cellulitis present Dressing/Incision - clean, dry, no drainage Motor Function - intact, moving foot and toes well on exam.   Pt is intact to light touch in the distribution of the axillary nerve in the left upper extremity.  Moving hand and fingers well on exam.  Abd soft with normal BS.  Passing gas.  Past Medical History:  Diagnosis Date  . Arthritis    hips and lower back  . Elevated cholesterol 1995  . Enlarged prostate     Assessment/Plan: 1 Day Post-Op Procedure(s) (LRB): REVERSE SHOULDER ARTHROPLASTY (Left) Active Problems:   Status post reverse total  shoulder replacement  Estimated body mass index is 27.07 kg/m as calculated from the following:   Height as of this encounter: 5' 9.5" (1.765 m).   Weight as of this encounter: 84.4 kg (186 lb). Advance diet Up with therapy D/C IV fluids when tolerating PO intake.  N/V has resolved. Pt is urinating without a foley. BP 109/54, likely from vomiting yesterday and decreased PO intake.  Encouraged PO intake this AM. Plan will be for d/c pending PT today.  DVT Prophylaxis - Lovenox, Foot Pumps and TED hose Non-weight bearing to the left upper extremity.  Raquel Mahima Hottle, PA-C [facility] 11/07/2015, 7:47 AM

## 2015-11-07 NOTE — Evaluation (Addendum)
Occupational Therapy Evaluation Patient Details Name: George Bruce MRN: [id number] DOB: April 19, 1945 Today's Date: 11/07/2015    History of Present Illness Pt is a 70 y.o. male who was admitted for a Left TSA.   Clinical Impression   Pt. Is a 70 y.o. Male who was admitted for a Left TSA. Pt. Presents with 9/10 left shoulder pain, and immobilization to his dominant LUE which hinders his ability to complete ADL tasks. Pt. Could benefit from skilled OT services at this level of care for pt./family training about work simplification techniques during ADLs, ADL training, and A/E training in order to work towards returning to his PLOF. Pt. Plans to return home with his wife. Pt. And wife report that she will be able to assist pt. With ADL and IADL care needs.    Follow Up Recommendations  No OT follow up    Equipment Recommendations       Recommendations for Other Services PT consult     Precautions / Restrictions Precautions Precautions: Shoulder;Fall Type of Shoulder Precautions: Reverse TSA Shoulder Interventions: Shoulder sling/immobilizer;Shoulder abduction pillow Required Braces or Orthoses: Sling Restrictions Weight Bearing Restrictions: Yes RUE Weight Bearing: Weight bearing as tolerated LUE Weight Bearing: Non weight bearing                        Transfers     Transfers: Stand Pivot Transfers Sit to Stand: Supervision                                                          ADL Overall ADL's : Needs assistance/impaired Eating/Feeding: Independent   Grooming: Moderate assistance           Upper Body Dressing : Maximal assistance (secondary to immobilization)   Lower Body Dressing: Minimal assistance               Functional mobility during ADLs: Supervision/safety       Vision     Perception     Praxis      Pertinent Vitals/Pain Pain Assessment: 0-10     Hand Dominance Left   Extremity/Trunk  Assessment Upper Extremity Assessment Upper Extremity Assessment: LUE deficits/detail (Left shoulder immobilization)   Cervical / Trunk Assessment Cervical / Trunk Assessment: Normal   Communication Communication Communication: No difficulties   Cognition Arousal/Alertness: Awake/alert Behavior During Therapy: WFL for tasks assessed/performed Overall Cognitive Status: Within Functional Limits for tasks assessed                     General Comments       Exercises       Shoulder Instructions      Home Living Family/patient expects to be discharged to:: Private residence Living Arrangements: Spouse/significant other Available Help at Discharge: Family Type of Home: House Home Access: Stairs to enter Technical brewer of Steps: 2 Entrance Stairs-Rails: Right Home Layout: One level     Bathroom Shower/Tub: Teacher, early years/pre: Standard     Home Equipment: Hand held shower head          Prior Functioning/Environment Level of Independence: Independent             OT Diagnosis: Generalized weakness;Acute pain   OT Problem List: Decreased strength;Decreased coordination;Pain;Decreased activity tolerance;Decreased safety awareness;Decreased knowledge  of use of DME or AE   OT Treatment/Interventions:      OT Goals(Current goals can be found in the care plan section) Acute Rehab OT Goals Patient Stated Goal: To return home OT Goal Formulation: With patient Potential to Achieve Goals: Good  OT Frequency:     Barriers to D/C:            Co-evaluation              End of Session Equipment Utilized During Treatment: Gait belt  Activity Tolerance: Patient tolerated treatment well Patient left: in chair;with chair alarm set;with family/visitor present   Time:  9:20- 9:45   Charges:  OT General Charges $OT Visit: 1 Procedure OT Evaluation $OT Eval Moderate Complexity: 1 Procedure G-Codes:    Harrel Carina 06-Dec-2015,  11:24 AM

## 2015-11-07 NOTE — Progress Notes (Signed)
DISCHARGE NOTE:  Pt given discharge instructions and prescription. Pt verbalized understanding. Pt wheeled out to car.

## 2015-11-07 NOTE — Discharge Summary (Signed)
Physician Discharge Summary  Patient ID: George Bruce MRN: [id number] DOB/AGE: October 27, 1945 70 y.o.  Admit date: 11/06/2015 Discharge date: 11/07/2015  Admission Diagnoses:  COMPLETE TEAR OF LEFT ROTATOR CUFF,ROTATOR CUFF TENDINITIS,ROTATOR CUFF ARTHROPATHY Massive irreparable rotator cuff tear with cuff arthropathy, left shoulder.  Discharge Diagnoses: Patient Active Problem List   Diagnosis Date Noted  . 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  Past Medical History:  Diagnosis Date  . Arthritis    hips and lower back  . Elevated cholesterol 1995  . Enlarged prostate      Transfusion: None   Consultants (if any):   Discharged Condition: Improved  Hospital Course: KEONNE COPPING is an 70 y.o. male who was admitted 11/06/2015 with a diagnosis of a massive irreparable rotator cuff tear with cuff arthropathy of the left shoulder and went to the operating room on 11/06/2015 and underwent the above named procedures.    Surgeries: Procedure(s): REVERSE SHOULDER ARTHROPLASTY on 11/06/2015 Patient tolerated the surgery well. Taken to PACU where she was stabilized and then transferred to the orthopedic floor.  Started on Lovenox 40mg  q 24 hrs. Foot pumps applied bilaterally at 80 mm. Heels elevated on bed with rolled towels. No evidence of DVT. Negative Homan. Physical therapy and OT started on POD1 to work on ambulation.  Patient's IV , foley was removed on POD1.  Implants: All press-fit [employer] Comprehensive system with a #13 mini-humeral stem, a 44 mm humeral tray with a standard insert, and a standard base plate with a 36 mm glenosphere.  He was given perioperative antibiotics:  Anti-infectives    Start     Dose/Rate Route Frequency Ordered Stop   11/06/15 1400  ceFAZolin (ANCEF) IVPB 2g/100 mL premix     2 g 200 mL/hr over 30 Minutes Intravenous Every 6 hours 11/06/15 1206 11/07/15  0117   11/06/15 0541  ceFAZolin (ANCEF) 2-4 GM/100ML-% IVPB    Comments:  Phillips Grout: cabinet override      11/06/15 0541 11/06/15 1744   11/06/15 0145  ceFAZolin (ANCEF) IVPB 2g/100 mL premix     2 g 200 mL/hr over 30 Minutes Intravenous  Once 11/06/15 0134 11/06/15 0830    .  He was given sequential compression devices, early ambulation, and Lovenox for DVT prophylaxis.  He benefited maximally from the hospital stay and there were no complications.    Recent vital signs:  Vitals:   11/07/15 0413 11/07/15 0734  BP: 110/61 (!) 109/54  Pulse: 64 60  Resp: 16 16  Temp: 98.2 F (36.8 C) 98.7 F (37.1 C)    Recent laboratory studies:  Lab Results  Component Value Date   HGB 13.3 11/07/2015   HGB 15.0 11/02/2015   Lab Results  Component Value Date   WBC 11.7 (H) 11/07/2015   PLT 137 (L) 11/07/2015   Lab Results  Component Value Date   INR 1.09 11/02/2015   Lab Results  Component Value Date   NA 142 11/07/2015   K 4.2 11/07/2015   CL 109 11/07/2015   CO2 30 11/07/2015   BUN 15 11/07/2015   CREATININE 1.02 11/07/2015   GLUCOSE 101 (H) 11/07/2015    Discharge Medications:     Medication List    TAKE these medications   aspirin EC 81 MG tablet Take 81 mg by mouth every evening. At 1800   naproxen 500 MG tablet Commonly known as:  NAPROSYN Take 500 mg by  mouth 2 (two) times daily as needed.   ondansetron 4 MG tablet Commonly known as:  ZOFRAN Take 1 tablet (4 mg total) by mouth every 6 (six) hours as needed for nausea.   oxyCODONE 5 MG immediate release tablet Commonly known as:  Oxy IR/ROXICODONE Take 1-2 tablets (5-10 mg total) by mouth every 4 (four) hours as needed for breakthrough pain.   simvastatin 40 MG tablet Commonly known as:  ZOCOR Take 40 mg by mouth daily at 6 PM.   tamsulosin 0.4 MG Caps capsule Commonly known as:  FLOMAX 0.4 mg daily after breakfast.       Diagnostic Studies: Mr Shoulder Left Wo Contrast  Result Date:  10/12/2015 CLINICAL DATA:  Chronic progressive left shoulder pain with painful and decreased range of motion. EXAM: MRI OF THE LEFT SHOULDER WITHOUT CONTRAST TECHNIQUE: Multiplanar, multisequence MR imaging of the shoulder was performed. No intravenous contrast was administered. COMPARISON:  None. FINDINGS: Rotator cuff: There are large full-thickness retracted tears of the subscapularis, supraspinous and infraspinatus tendons. Teres minor tendon is intact as are a few inferior fibers of the infraspinatus tendon. Muscles: Severe chronic atrophy of the muscles of the rotator cuff particularly of the subscapularis which is almost completely rib fatty replaced. There is edema in the infraspinatus muscle at the musculotendinous junction consistent with more recent progression of the tear. Biceps long head: Long head of the biceps tendon is avulsed from its origin and distally retracted. Acromioclavicular Joint: Moderate AC joint arthropathy with inferior osteophytes on the distal clavicle which could predispose to impingement. Glenohumeral Joint: Moderate glenohumeral joint effusion. Superior subluxation of the humeral head. Small inferior osteophytes on the humeral head. Labrum: The labrum is not well seen due to motion artifact on most of the images. However, there is no discrete labral tear. The long head of the biceps tendon is avulsed at its origin. Bones:  No acute abnormalities. Other: None IMPRESSION: Large chronic full-thickness retracted tears of the infraspinatus and supraspinous tendons with secondary severe atrophy of those muscles. Full-thickness almost full width retracted tear of the infraspinatus with atrophy and edema in the infraspinatus. Avulsion of the long head of the biceps tendon with distal retraction. Electronically Signed   By: Lorriane Shire M.D.   On: 10/12/2015 11:20   Dg Shoulder Left Port  Result Date: 11/06/2015 CLINICAL DATA:  Postop left shoulder. EXAM: LEFT SHOULDER - 1 VIEW  COMPARISON:  None. FINDINGS: Two views of the left shoulder status post arthropathy. Hardware appears intact and grossly well-positioned. Expected postsurgical changes within the overlying soft tissues. IMPRESSION: Status post left shoulder arthropathy. No evidence of surgical complicating feature. Electronically Signed   By: Franki Cabot M.D.   On: 11/06/2015 12:01   Disposition: Plan will be for discharge home today pending PT and oral intake.  Continue ASA 81mg  twice a day for DVT prophylaxis.  Follow-up Information    Judson Roch, PA-C Follow up in 10 day(s).   Specialty:  Physician Assistant Why:  Electa Sniff information: [place] [address] [phone]          Signed: Judson Roch PA-C 11/07/2015, 8:00 AM

## 2015-11-26 DIAGNOSIS — Z96612 Presence of left artificial shoulder joint: Secondary | ICD-10-CM | POA: Insufficient documentation

## 2016-01-03 DIAGNOSIS — Z8589 Personal history of malignant neoplasm of other organs and systems: Secondary | ICD-10-CM

## 2016-01-03 HISTORY — DX: Personal history of malignant neoplasm of other organs and systems: Z85.89

## 2016-03-18 DIAGNOSIS — N410 Acute prostatitis: Secondary | ICD-10-CM | POA: Insufficient documentation

## 2016-06-24 DIAGNOSIS — M545 Low back pain, unspecified: Secondary | ICD-10-CM | POA: Insufficient documentation

## 2016-06-24 DIAGNOSIS — M2550 Pain in unspecified joint: Secondary | ICD-10-CM | POA: Insufficient documentation

## 2016-06-24 DIAGNOSIS — M25552 Pain in left hip: Secondary | ICD-10-CM

## 2016-06-24 DIAGNOSIS — G8929 Other chronic pain: Secondary | ICD-10-CM | POA: Insufficient documentation

## 2016-06-24 DIAGNOSIS — M25551 Pain in right hip: Secondary | ICD-10-CM | POA: Insufficient documentation

## 2016-07-17 ENCOUNTER — Other Ambulatory Visit: Payer: Self-pay | Admitting: Internal Medicine

## 2016-07-17 DIAGNOSIS — R1011 Right upper quadrant pain: Secondary | ICD-10-CM

## 2016-07-21 ENCOUNTER — Ambulatory Visit
Admission: RE | Admit: 2016-07-21 | Discharge: 2016-07-21 | Disposition: A | Discharge status: Home / Self Care | Payer: Medicare Other | Source: Ambulatory Visit | Attending: Internal Medicine | Admitting: Internal Medicine

## 2016-07-21 DIAGNOSIS — R1011 Right upper quadrant pain: Secondary | ICD-10-CM | POA: Diagnosis present

## 2016-07-21 DIAGNOSIS — K7689 Other specified diseases of liver: Secondary | ICD-10-CM | POA: Diagnosis not present

## 2016-08-26 DIAGNOSIS — M5136 Other intervertebral disc degeneration, lumbar region: Secondary | ICD-10-CM | POA: Insufficient documentation

## 2017-08-12 ENCOUNTER — Encounter: Payer: Self-pay | Admitting: Urology

## 2017-08-12 ENCOUNTER — Ambulatory Visit (INDEPENDENT_AMBULATORY_CARE_PROVIDER_SITE_OTHER): Payer: Medicare Other | Admitting: Urology

## 2017-08-12 VITALS — BP 116/70 | HR 69 | Ht 69.5 in | Wt 188.6 lb

## 2017-08-12 DIAGNOSIS — R972 Elevated prostate specific antigen [PSA]: Secondary | ICD-10-CM

## 2017-08-12 DIAGNOSIS — R3914 Feeling of incomplete bladder emptying: Secondary | ICD-10-CM

## 2017-08-12 DIAGNOSIS — N401 Enlarged prostate with lower urinary tract symptoms: Secondary | ICD-10-CM | POA: Diagnosis not present

## 2017-08-12 LAB — URINALYSIS, COMPLETE
BILIRUBIN UA: NEGATIVE
GLUCOSE, UA: NEGATIVE
Ketones, UA: NEGATIVE
Nitrite, UA: NEGATIVE
PH UA: 6.5 (ref 5.0–7.5)
PROTEIN UA: NEGATIVE
RBC UA: NEGATIVE
SPEC GRAV UA: 1.01 (ref 1.005–1.030)
UUROB: 0.2 mg/dL (ref 0.2–1.0)

## 2017-08-12 LAB — BLADDER SCAN AMB NON-IMAGING

## 2017-08-12 LAB — MICROSCOPIC EXAMINATION
BACTERIA UA: NONE SEEN
Epithelial Cells (non renal): NONE SEEN /hpf (ref 0–10)
WBC UA: NONE SEEN /HPF (ref 0–5)

## 2017-08-12 MED ORDER — FINASTERIDE 5 MG PO TABS: 5.0000 mg | ORAL_TABLET | Freq: Every day | ORAL | 3 refills | Status: DC

## 2017-08-12 MED ORDER — SILDENAFIL CITRATE 20 MG PO TABS: ORAL_TABLET | ORAL | 2 refills | Status: DC

## 2017-08-12 NOTE — Progress Notes (Signed)
08/12/2017 2:26 PM   George Bruce [birth year] [id number]  Referring provider: Idelle Crouch, MD [facility] [place], [place] [address]  Chief Complaint  Patient presents with  . Establish Care  . Elevated PSA    HPI: George Bruce is a 72 year old male with a history of an elevated PSA who presents for transfer of care from [facility].  He has previously seen Dr. Jacqlyn Larsen at [facility].  A prostate biopsy was performed in January 2018 for PSA of 4.81.  His prostate volume was 73 cc pathology was benign.  His PSA in April 2018 bumped to 6.16 and a prostate MRI was performed which was remarkable for a volume of 71 cc and no lesions suspicious for high-grade prostate cancer.  He had mild to moderate lower urinary tract symptoms with incomplete bladder emptying and was started on finasteride at that time.  A follow-up PSA in October 2018 was 2.89.  He has no complaints today.  He has no bothersome lower urinary tract symptoms.  Denies dysuria or gross hematuria.  Denies flank, abdominal, pelvic or scrotal pain.  He takes generic sildenafil for erectile dysfunction.   PMH: Past Medical History:  Diagnosis Date  . Arthritis    hips and lower back  . Elevated cholesterol 1995  . Enlarged prostate     Surgical History: Past Surgical History:  Procedure Laterality Date  . INGUINAL HERNIA REPAIR Left 1990's  . INGUINAL HERNIA REPAIR Right 2015  . REVERSE SHOULDER ARTHROPLASTY Left 11/06/2015   Procedure: REVERSE SHOULDER ARTHROPLASTY;  Surgeon: Corky Mull, MD;  Location: [facility] ORS;  Service: Orthopedics;  Laterality: Left;    Home Medications:  Allergies as of 08/12/2017   No Known Allergies     Medication List        Accurate as of 08/12/17  2:26 PM. Always use your most recent med list.          aspirin EC 81 MG tablet Take 81 mg by mouth every evening. At 1800   finasteride 5 MG tablet Commonly known as:  PROSCAR Take by mouth.   naproxen  500 MG tablet Commonly known as:  NAPROSYN Take 500 mg by mouth 2 (two) times daily as needed.   sildenafil 20 MG tablet Commonly known as:  REVATIO 3-5 tablets po daily as needed.   simvastatin 40 MG tablet Commonly known as:  ZOCOR Take 40 mg by mouth daily at 6 PM.   tamsulosin 0.4 MG Caps capsule Commonly known as:  FLOMAX 0.4 mg daily after breakfast.   TURMERIC COMPLEX/BLACK PEPPER 3-500 MG Caps Generic drug:  Black Pepper-Turmeric Take by mouth.       Allergies: No Known Allergies  Family History: Family History  Problem Relation Age of Onset  . Diabetes Mother     Social History:  reports that he quit smoking about 22 years ago. He has never used smokeless tobacco. He reports that he drinks about 1.2 - 1.8 oz of alcohol per week. He reports that he does not use drugs.  ROS: UROLOGY Frequent Urination?: No Hard to postpone urination?: No Burning/pain with urination?: Yes Get up at night to urinate?: No Leakage of urine?: No Urine stream starts and stops?: No Trouble starting stream?: No Do you have to strain to urinate?: No Blood in urine?: No Urinary tract infection?: No Sexually transmitted disease?: No Injury to kidneys or bladder?: No Painful intercourse?: No Weak stream?: No Erection problems?: Yes Penile pain?: No  Gastrointestinal Nausea?: No Vomiting?: No Indigestion/heartburn?: No Diarrhea?: No Constipation?: No  Constitutional Fever: No Night sweats?: No Weight loss?: No Fatigue?: No  Skin Skin rash/lesions?: No Itching?: No  Eyes Blurred vision?: No Double vision?: No  Ears/Nose/Throat Sore throat?: No Sinus problems?: No  Hematologic/Lymphatic Swollen glands?: No Easy bruising?: No  Cardiovascular Leg swelling?: No Chest pain?: No  Respiratory Cough?: No Shortness of breath?: No  Endocrine Excessive thirst?: No  Musculoskeletal Back pain?: No Joint pain?: No  Neurological Headaches?: No Dizziness?:  No  Psychologic Depression?: No Anxiety?: No  Physical Exam: BP 116/70 (BP Location: Right Arm, Patient Position: Sitting, Cuff Size: Large)   Pulse 69   Ht 5' 9.5" (1.765 m)   Wt 188 lb 9.6 oz (85.5 kg)   SpO2 99%   BMI 27.45 kg/m   Constitutional:  Alert and oriented, No acute distress. HEENT: [place] AT, moist mucus membranes.  Trachea midline, no masses. Cardiovascular: No clubbing, cyanosis, or edema. Respiratory: Normal respiratory effort, no increased work of breathing. GI: Abdomen is soft, nontender, nondistended, no abdominal masses GU: No CVA tenderness.  Prostate is 50 g smooth without nodules. Lymph: No cervical or inguinal lymphadenopathy. Skin: No rashes, bruises or suspicious lesions. Neurologic: Grossly intact, no focal deficits, moving all 4 extremities. Psychiatric: Normal mood and affect.   Assessment & Plan:   72 year old male with an elevated PSA/benign prostate biopsy.  He has had an appropriate decrease in his PSA with finasteride.  A PSA was drawn today and if stable will see him back in 6 months.  Finasteride was refilled.  His PVR is stable and will recheck on follow-up.  Generic sildenafil was refilled.   Return in about 6 months (around 02/11/2018) for Recheck, PSA.  Abbie Sons, [place] [address] [place], [place] [address] [phone]

## 2017-08-13 ENCOUNTER — Telehealth: Payer: Self-pay

## 2017-08-13 LAB — PSA: Prostate Specific Ag, Serum: 3 ng/mL (ref 0.0–4.0)

## 2017-08-13 NOTE — Telephone Encounter (Signed)
Pt informed

## 2017-08-13 NOTE — Telephone Encounter (Signed)
-----   Message from Abbie Sons, MD sent at 08/13/2017  7:12 AM EDT ----- PSA is stable at 3.0

## 2018-02-10 ENCOUNTER — Other Ambulatory Visit: Payer: Self-pay

## 2018-02-10 DIAGNOSIS — R972 Elevated prostate specific antigen [PSA]: Secondary | ICD-10-CM

## 2018-02-11 ENCOUNTER — Other Ambulatory Visit: Payer: Medicare Other

## 2018-02-11 DIAGNOSIS — R972 Elevated prostate specific antigen [PSA]: Secondary | ICD-10-CM

## 2018-02-12 LAB — PSA: Prostate Specific Ag, Serum: 3 ng/mL (ref 0.0–4.0)

## 2018-02-18 ENCOUNTER — Encounter: Payer: Self-pay | Admitting: Urology

## 2018-02-18 ENCOUNTER — Ambulatory Visit (INDEPENDENT_AMBULATORY_CARE_PROVIDER_SITE_OTHER): Payer: Medicare Other | Admitting: Urology

## 2018-02-18 VITALS — BP 146/72 | HR 68 | Ht 69.0 in | Wt 192.2 lb

## 2018-02-18 DIAGNOSIS — R3914 Feeling of incomplete bladder emptying: Secondary | ICD-10-CM | POA: Diagnosis not present

## 2018-02-18 DIAGNOSIS — N5201 Erectile dysfunction due to arterial insufficiency: Secondary | ICD-10-CM | POA: Diagnosis not present

## 2018-02-18 DIAGNOSIS — N401 Enlarged prostate with lower urinary tract symptoms: Secondary | ICD-10-CM

## 2018-02-18 DIAGNOSIS — R972 Elevated prostate specific antigen [PSA]: Secondary | ICD-10-CM

## 2018-02-18 LAB — BLADDER SCAN AMB NON-IMAGING

## 2018-02-18 NOTE — Progress Notes (Signed)
02/18/2018 1:41 PM   George Bruce Apr 19, 1945 [id number]  Referring provider: Idelle Crouch, MD [facility] [place], [place] [address]  Chief Complaint  Patient presents with  . Elevated PSA   Urologic history: 1.  Elevated PSA -Prostate biopsy 03/2016; PSA 4.1; prostate volume 73 cc; benign pathology -MRI prostate 06/2016 PSA 6.16; no suspicious lesions  2.  BPH with lower urinary tract symptoms -Finasteride 5 mg daily -Incomplete emptying; PVR ~200 mL  3.  Erectile dysfunction -Generic sildenafil  HPI: George Bruce is a 72 yo M who returns today for follow-up of the above urologic history.  The patient's last visit with [place] was on 08/12/2017.  The patient reports of doing well overall. His most recent PSA from 02/11/2018 is 3.0 (uncorrected). He has no bothersome urinary symptoms. His PVR is 158 mL.   PMH: Past Medical History:  Diagnosis Date  . Arthritis    hips and lower back  . Elevated cholesterol 1995  . Enlarged prostate     Surgical History: Past Surgical History:  Procedure Laterality Date  . INGUINAL HERNIA REPAIR Left 1990's  . INGUINAL HERNIA REPAIR Right 2015  . REVERSE SHOULDER ARTHROPLASTY Left 11/06/2015   Procedure: REVERSE SHOULDER ARTHROPLASTY;  Surgeon: Corky Mull, MD;  Location: [facility] ORS;  Service: Orthopedics;  Laterality: Left;    Home Medications:  Allergies as of 02/18/2018   No Known Allergies     Medication List       Accurate as of February 18, 2018  1:41 PM. Always use your most recent med list.        aspirin EC 81 MG tablet Take 81 mg by mouth every evening. At 1800   finasteride 5 MG tablet Commonly known as:  PROSCAR Take 1 tablet (5 mg total) by mouth daily.   FLUAD 0.5 ML Susy Generic drug:  Influenza Vac A&B Surf Ant Adj ADM 0.5ML IM UTD   naproxen 500 MG tablet Commonly known as:  NAPROSYN Take 500 mg by mouth 2 (two) times daily as needed.   sildenafil 20 MG  tablet Commonly known as:  REVATIO 2-5 tablets 1 hour prior to intercourse not to exceed 5 tablets daily   simvastatin 40 MG tablet Commonly known as:  ZOCOR Take 40 mg by mouth daily at 6 PM.   tamsulosin 0.4 MG Caps capsule Commonly known as:  FLOMAX 0.4 mg daily after breakfast.   TURMERIC COMPLEX/BLACK PEPPER 3-500 MG Caps Generic drug:  Black Pepper-Turmeric Take by mouth.       Allergies: No Known Allergies  Family History: Family History  Problem Relation Age of Onset  . Diabetes Mother     Social History:  reports that he quit smoking about 23 years ago. He has never used smokeless tobacco. He reports current alcohol use of about 2.0 - 3.0 standard drinks of alcohol per week. He reports that he does not use drugs.  ROS: UROLOGY Frequent Urination?: No Hard to postpone urination?: No Burning/pain with urination?: No Get up at night to urinate?: No Leakage of urine?: No Urine stream starts and stops?: No Trouble starting stream?: No Do you have to strain to urinate?: No Blood in urine?: No Urinary tract infection?: No Sexually transmitted disease?: No Injury to kidneys or bladder?: No Painful intercourse?: No Weak stream?: No Erection problems?: No Penile pain?: No  Gastrointestinal Nausea?: No Vomiting?: No Indigestion/heartburn?: No Diarrhea?: No Constipation?: No  Constitutional Fever: No Night sweats?: No  Weight loss?: No Fatigue?: No  Skin Skin rash/lesions?: No Itching?: No  Eyes Blurred vision?: No Double vision?: No  Ears/Nose/Throat Sore throat?: No Sinus problems?: No  Hematologic/Lymphatic Swollen glands?: No Easy bruising?: No  Cardiovascular Leg swelling?: No Chest pain?: No  Respiratory Cough?: No Shortness of breath?: No  Endocrine Excessive thirst?: No  Musculoskeletal Back pain?: No Joint pain?: No  Neurological Headaches?: No Dizziness?: No  Psychologic Depression?: No Anxiety?: No  Physical  Exam: BP (!) 146/72 (BP Location: Left Arm, Patient Position: Sitting, Cuff Size: Large)   Pulse 68   Ht 5\' 9"  (1.753 m)   Wt 192 lb 3.2 oz (87.2 kg)   BMI 28.38 kg/m   Constitutional: Well nourished. Alert and oriented, No acute distress. HEENT: [place] AT, moist mucus membranes. Trachea midline, no masses. Cardiovascular: No clubbing, cyanosis, or edema. Respiratory: Normal respiratory effort, no increased work of breathing. Rectal: Patient with  normal sphincter tone. Anus and perineum without scarring or rashes. No rectal masses are appreciated. Prostate is approximately 40 grams, smooth rectal tone, no nodules are appreciated. Seminal vesicles are normal. Skin: No rashes, bruises or suspicious lesions. Neurologic: Grossly intact, no focal deficits, moving all 4 extremities. Psychiatric: Normal mood and affect.  Urinalysis  Pertinent Imaging: Results for orders placed or performed in visit on 02/18/18  Bladder Scan (Post Void Residual) in office  Result Value Ref Range   Scan Result 135mL     Assessment & Plan:    1. Elevated PSA  Stable PSA PSA in 6 months with PCP and MD visit in 1 year with PSA/DRE  2. ED Continue generic sildenafil; no refills needed   3.  BPH with incomplete bladder emptying -Stable PVR -Continue tamsulosin/finasteride  Return for PSA/DRE in 1 year and 6 month PSA with PCP .  George Bruce, [place] [address] [place], [place] [address] [phone]  I, George Bruce, am acting as a scribe for Dr. Nicki Reaper C. Ivianna [place],  I, George Sons, MD, have reviewed all documentation for this visit. The documentation on 02/18/18 for the exam, diagnosis, procedures, and orders are all accurate and complete.

## 2018-02-19 ENCOUNTER — Telehealth: Payer: Self-pay | Admitting: Urology

## 2018-02-19 DIAGNOSIS — R3914 Feeling of incomplete bladder emptying: Principal | ICD-10-CM

## 2018-02-19 DIAGNOSIS — N401 Enlarged prostate with lower urinary tract symptoms: Secondary | ICD-10-CM

## 2018-02-19 MED ORDER — TAMSULOSIN HCL 0.4 MG PO CAPS
0.4000 mg | ORAL_CAPSULE | Freq: Every day | ORAL | 3 refills | Status: DC
Start: 2018-02-19 — End: 2019-03-07

## 2018-02-19 NOTE — Telephone Encounter (Signed)
RX sent in

## 2018-02-19 NOTE — Telephone Encounter (Signed)
Pt LMOM needing a RX refill.  He didn't say which medication.  Please call pt [phone]

## 2018-02-19 NOTE — Telephone Encounter (Signed)
Tamsulosin sent to [facility]

## 2018-04-01 ENCOUNTER — Other Ambulatory Visit: Payer: Self-pay

## 2018-04-01 ENCOUNTER — Other Ambulatory Visit: Payer: Self-pay | Admitting: Internal Medicine

## 2018-04-01 DIAGNOSIS — Z86010 Personal history of colonic polyps: Secondary | ICD-10-CM

## 2018-04-01 DIAGNOSIS — R1011 Right upper quadrant pain: Secondary | ICD-10-CM

## 2018-04-06 ENCOUNTER — Ambulatory Visit
Admission: RE | Admit: 2018-04-06 | Discharge: 2018-04-06 | Disposition: A | Discharge status: Home / Self Care | Payer: Medicare Other | Source: Ambulatory Visit | Attending: Internal Medicine | Admitting: Internal Medicine

## 2018-04-06 DIAGNOSIS — R1011 Right upper quadrant pain: Secondary | ICD-10-CM | POA: Insufficient documentation

## 2018-04-21 ENCOUNTER — Ambulatory Visit
Admission: RE | Admit: 2018-04-21 | Discharge: 2018-04-21 | Disposition: A | Discharge status: Home / Self Care | Payer: Medicare Other | Attending: Gastroenterology | Admitting: Gastroenterology

## 2018-04-21 ENCOUNTER — Ambulatory Visit: Payer: Medicare Other | Admitting: Anesthesiology

## 2018-04-21 ENCOUNTER — Encounter
Admission: RE | Disposition: A | Discharge status: Home / Self Care | Payer: Self-pay | Source: Home / Self Care | Attending: Gastroenterology

## 2018-04-21 DIAGNOSIS — K5730 Diverticulosis of large intestine without perforation or abscess without bleeding: Secondary | ICD-10-CM | POA: Diagnosis not present

## 2018-04-21 DIAGNOSIS — Z860100 Personal history of colon polyps, unspecified: Secondary | ICD-10-CM

## 2018-04-21 DIAGNOSIS — Z79899 Other long term (current) drug therapy: Secondary | ICD-10-CM | POA: Insufficient documentation

## 2018-04-21 DIAGNOSIS — D122 Benign neoplasm of ascending colon: Secondary | ICD-10-CM | POA: Diagnosis not present

## 2018-04-21 DIAGNOSIS — D126 Benign neoplasm of colon, unspecified: Secondary | ICD-10-CM | POA: Diagnosis not present

## 2018-04-21 DIAGNOSIS — Z87891 Personal history of nicotine dependence: Secondary | ICD-10-CM | POA: Insufficient documentation

## 2018-04-21 DIAGNOSIS — D125 Benign neoplasm of sigmoid colon: Secondary | ICD-10-CM

## 2018-04-21 DIAGNOSIS — K635 Polyp of colon: Secondary | ICD-10-CM | POA: Diagnosis not present

## 2018-04-21 DIAGNOSIS — Z09 Encounter for follow-up examination after completed treatment for conditions other than malignant neoplasm: Secondary | ICD-10-CM | POA: Diagnosis present

## 2018-04-21 HISTORY — PX: COLONOSCOPY WITH PROPOFOL: SHX5780

## 2018-04-21 SURGERY — COLONOSCOPY WITH PROPOFOL
Anesthesia: General

## 2018-04-21 MED ORDER — PROPOFOL 10 MG/ML IV BOLUS
INTRAVENOUS | Status: DC | PRN
  Administered 2018-04-21: 50 mg via INTRAVENOUS

## 2018-04-21 MED ORDER — PROPOFOL 500 MG/50ML IV EMUL
INTRAVENOUS | Status: DC | PRN
  Administered 2018-04-21: 180 ug/kg/min via INTRAVENOUS

## 2018-04-21 MED ORDER — LIDOCAINE HCL (CARDIAC) PF 100 MG/5ML IV SOSY
PREFILLED_SYRINGE | INTRAVENOUS | Status: DC | PRN
  Administered 2018-04-21: 60 mg via INTRAVENOUS

## 2018-04-21 MED ORDER — SODIUM CHLORIDE 0.9 % IV SOLN
INTRAVENOUS | Status: DC
  Administered 2018-04-21: 1000 mL via INTRAVENOUS
  Administered 2018-04-21: 10:00:00 via INTRAVENOUS

## 2018-04-21 NOTE — Anesthesia Post-op Follow-up Note (Signed)
Anesthesia QCDR form completed.        

## 2018-04-21 NOTE — Anesthesia Postprocedure Evaluation (Signed)
Anesthesia Post Note  Patient: George Bruce  Procedure(s) Performed: COLONOSCOPY WITH PROPOFOL (N/A )  Patient location during evaluation: Endoscopy Anesthesia Type: General Level of consciousness: awake and alert Pain management: pain level controlled Vital Signs Assessment: post-procedure vital signs reviewed and stable Respiratory status: spontaneous breathing, nonlabored ventilation, respiratory function stable and patient connected to nasal cannula oxygen Cardiovascular status: blood pressure returned to baseline and stable Postop Assessment: no apparent nausea or vomiting Anesthetic complications: no     Last Vitals:  Vitals:   04/21/18 1059 04/21/18 1109  BP: 115/68 134/77  Pulse: (!) 58 61  Resp: 16 17  Temp:    SpO2: 98% 100%    Last Pain:  Vitals:   04/21/18 1049  TempSrc: Tympanic  PainSc:                  Mattthew Ziomek S

## 2018-04-21 NOTE — Anesthesia Preprocedure Evaluation (Addendum)
Anesthesia Evaluation  Patient identified by MRN, date of birth, ID band Patient awake    Reviewed: Allergy & Precautions, NPO status , Patient's Chart, lab work & pertinent test results, reviewed documented beta blocker date and time   Airway Mallampati: II  TM Distance: >3 FB     Dental  (+) Chipped, Partial Lower, Partial Upper   Pulmonary former smoker,           Cardiovascular      Neuro/Psych PSYCHIATRIC DISORDERS    GI/Hepatic   Endo/Other    Renal/GU      Musculoskeletal   Abdominal   Peds  Hematology   Anesthesia Other Findings EKG ok.  Reproductive/Obstetrics                            Anesthesia Physical Anesthesia Plan  ASA: III  Anesthesia Plan: General   Post-op Pain Management:    Induction: Intravenous  PONV Risk Score and Plan:   Airway Management Planned:   Additional Equipment:   Intra-op Plan:   Post-operative Plan:   Informed Consent: I have reviewed the patients History and Physical, chart, labs and discussed the procedure including the risks, benefits and alternatives for the proposed anesthesia with the patient or authorized representative who has indicated his/her understanding and acceptance.       Plan Discussed with: CRNA  Anesthesia Plan Comments:         Anesthesia Quick Evaluation

## 2018-04-21 NOTE — Transfer of Care (Signed)
Immediate Anesthesia Transfer of Care Note  Patient: George Bruce  Procedure(s) Performed: COLONOSCOPY WITH PROPOFOL (N/A )  Patient Location: PACU  Anesthesia Type:General  Level of Consciousness: sedated  Airway & Oxygen Therapy: Patient Spontanous Breathing and Patient connected to nasal cannula oxygen  Post-op Assessment: Report given to RN and Post -op Vital signs reviewed and stable  Post vital signs: Reviewed and stable  Last Vitals:  Vitals Value Taken Time  BP 104/62 04/21/2018 10:49 AM  Temp 36.2 C 04/21/2018 10:49 AM  Pulse 64 04/21/2018 10:49 AM  Resp 17 04/21/2018 10:49 AM  SpO2 99 % 04/21/2018 10:49 AM    Last Pain:  Vitals:   04/21/18 1049  TempSrc: Tympanic  PainSc:          Complications: No apparent anesthesia complications

## 2018-04-21 NOTE — H&P (Signed)
Vonda Antigua, MD [address], [place], [place], [place], [address] [address], [place], [place], [place], [address] Phone: [phone]  Fax: [phone]  Primary Care Physician:  Idelle Crouch, MD   Pre-Procedure History & Physical: HPI:  George Bruce is a 73 y.o. male is here for a colonoscopy.   Past Medical History:  Diagnosis Date  . Arthritis    hips and lower back  . Elevated cholesterol 1995  . Enlarged prostate     Past Surgical History:  Procedure Laterality Date  . INGUINAL HERNIA REPAIR Left 1990's  . INGUINAL HERNIA REPAIR Right 2015  . 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 Authorizing Provider  Black Pepper-Turmeric (TURMERIC COMPLEX/BLACK PEPPER) 3-500 MG CAPS Take by mouth.   Yes [provider]  finasteride (PROSCAR) 5 MG tablet Take 1 tablet (5 mg total) by mouth daily. 08/12/17  Yes Stoioff, Ronda Fairly, MD  naproxen (NAPROSYN) 500 MG tablet Take 500 mg by mouth 2 (two) times daily as needed.   Yes [provider]  sildenafil (REVATIO) 20 MG tablet 2-5 tablets 1 hour prior to intercourse not to exceed 5 tablets daily 08/12/17  Yes Stoioff, Ronda Fairly, MD  simvastatin (ZOCOR) 40 MG tablet Take 40 mg by mouth daily at 6 PM.   Yes [provider]  tamsulosin (FLOMAX) 0.4 MG CAPS capsule Take 1 capsule (0.4 mg total) by mouth daily after breakfast. 02/19/18  Yes Stoioff, Ronda Fairly, MD  aspirin EC 81 MG tablet Take 81 mg by mouth every evening. At 1800    [provider]  FLUAD 0.5 ML SUSY ADM 0.5ML IM UTD 12/08/17   [provider]    Allergies as of 04/01/2018  . (No Known Allergies)    Family History  Problem Relation Age of Onset  . Diabetes Mother     Social History    Socioeconomic History  . Marital status: Married    Spouse name: Not on file  . Number of children: Not on file  . Years of education: Not on file  . Highest education level: Not on file  Occupational History  . Not on file  Social Needs  . Financial resource strain: Not on file  . Food insecurity:    Worry: Not on file    Inability: Not on file  . Transportation needs:    Medical: Not on file    Non-medical: Not on file  Tobacco Use  . Smoking status: Former Smoker    Last attempt to quit: 11/02/1994    Years since quitting: 23.4  . Smokeless tobacco: Never Used  Substance and Sexual Activity  . Alcohol use: Yes    Alcohol/week: 2.0 - 3.0 standard drinks    Types: 2 - 3 Cans of beer per week  . Drug use: No  . Sexual activity: Yes  Lifestyle  . Physical activity:    Days per week: Not on file    Minutes per session: Not on file  . Stress: Not on file  Relationships  . Social connections:    Talks on phone: Not on file    Gets together: Not on file    Attends religious service: Not on file    Active member of club or organization: Not on file  Attends meetings of clubs or organizations: Not on file    Relationship status: Not on file  . Intimate partner violence:    Fear of current or ex partner: Not on file    Emotionally abused: Not on file    Physically abused: Not on file    Forced sexual activity: Not on file  Other Topics Concern  . Not on file  Social History Narrative  . Not on file    Review of Systems: See HPI, otherwise negative ROS  Physical Exam: BP 140/88   Pulse 64   Temp (!) 95.6 F (35.3 C) (Tympanic)   Resp 17   Ht 5\' 9"  (1.753 m)   Wt 83.5 kg   SpO2 98%   BMI 27.17 kg/m  General:   Alert,  pleasant and cooperative in NAD Head:  Normocephalic and atraumatic. Neck:  Supple; no masses or thyromegaly. Lungs:  Clear throughout to auscultation, normal respiratory effort.    Heart:  +S1, +S2, Regular rate and rhythm, No  edema. Abdomen:  Soft, nontender and nondistended. Normal bowel sounds, without guarding, and without rebound.   Neurologic:  Alert and  oriented x4;  grossly normal neurologically.  Impression/Plan: George Bruce is here for a colonoscopy to be performed for polyp surveillance, with his of colon polyps on 2015 colonoscopy (colonoscop procedure report not available).   Risks, benefits, limitations, and alternatives regarding  colonoscopy have been reviewed with the patient.  Questions have been answered.  All parties agreeable.   Virgel Manifold, MD  04/21/2018, 10:00 AM

## 2018-04-21 NOTE — Anesthesia Procedure Notes (Signed)
Date/Time: 04/21/2018 10:00 AM Performed by: Allean Found, CRNA Pre-anesthesia Checklist: Patient identified, Emergency Drugs available, Suction available, Patient being monitored and Timeout performed Oxygen Delivery Method: Nasal cannula Placement Confirmation: positive ETCO2

## 2018-04-21 NOTE — Op Note (Signed)
[facility] Gastroenterology Patient Name: George Bruce Procedure Date: 04/21/2018 9:45 AM MRN: [id number] Account #: [id number] Date of Birth: 28-Apr-1945 Admit Type: Outpatient Age: 73 Room: [facility] ENDO ROOM 4 Gender: Male Note Status: Finalized Procedure:            Colonoscopy Indications:          High risk colon cancer surveillance: Personal history                        of colonic polyps Providers:            Sayvon Arterberry B. Bonna Gains MD, MD Medicines:            Monitored Anesthesia Care Complications:        No immediate complications. Procedure:            Pre-Anesthesia Assessment:                       - ASA Grade Assessment: II - A patient with mild                        systemic disease.                       - Prior to the procedure, a History and Physical was                        performed, and patient medications, allergies and                        sensitivities were reviewed. The patient's tolerance of                        previous anesthesia was reviewed.                       - The risks and benefits of the procedure and the                        sedation options and risks were discussed with the                        patient. All questions were answered and informed                        consent was obtained.                       - Patient identification and proposed procedure were                        verified prior to the procedure by the physician, the                        nurse, the anesthesiologist, the anesthetist and the                        technician. The procedure was verified in the procedure                        room.  After obtaining informed consent, the colonoscope was                        passed under direct vision. Throughout the procedure,                        the patient's blood pressure, pulse, and oxygen                        saturations were monitored continuously. The            Colonoscope was introduced through the anus and                        advanced to the the cecum, identified by appendiceal                        orifice and ileocecal valve. The colonoscopy was                        performed with ease. The patient tolerated the                        procedure well. The quality of the bowel preparation                        was good. Findings:      The perianal and digital rectal examinations were normal.      Three sessile polyps were found in the sigmoid colon and ascending       colon. The polyps were 2 to 4 mm in size. These polyps were removed with       a jumbo cold forceps. Resection and retrieval were complete. One of the       polyps was seen near a diverticulum. It was carefully removed without       touching the diverticulum. No bleeding post polypectomy.      A 6 mm polyp was found in the sigmoid colon. The polyp was sessile.       Polypectomy was attempted, initially using a cold snare. Polyp resection       was incomplete with this device. This intervention then required a       different device and polypectomy technique. The polyp was removed with a       jumbo cold forceps. Resection and retrieval were complete.      Multiple diverticula were found in the sigmoid colon.      The exam was otherwise without abnormality.      The rectum, sigmoid colon, descending colon, transverse colon, ascending       colon and cecum appeared normal.      The retroflexed view of the distal rectum and anal verge was normal and       showed no anal or rectal abnormalities. Impression:           - Three 2 to 4 mm polyps in the sigmoid colon and in                        the ascending colon, removed with a jumbo cold forceps.                        Resected  and retrieved.                       - One 6 mm polyp in the sigmoid colon, removed with a                        jumbo cold forceps. Resected and retrieved.                       -  Diverticulosis in the sigmoid colon.                       - The examination was otherwise normal.                       - The rectum, sigmoid colon, descending colon,                        transverse colon, ascending colon and cecum are normal.                       - The distal rectum and anal verge are normal on                        retroflexion view. Recommendation:       - Discharge patient to home (with escort).                       - High fiber diet.                       - Advance diet as tolerated.                       - Continue present medications.                       - Await pathology results.                       - Repeat colonoscopy date to be determined after                        pending pathology results are reviewed.                       - The findings and recommendations were discussed with                        the patient.                       - The findings and recommendations were discussed with                        the patient's family.                       - Return to primary care physician as previously                        scheduled. Procedure Code(s):    --- Professional ---  45380, Colonoscopy, flexible; with biopsy, single or                        multiple Diagnosis Code(s):    --- Professional ---                       Z86.010, Personal history of colonic polyps                       D12.5, Benign neoplasm of sigmoid colon                       D12.2, Benign neoplasm of ascending colon                       K57.30, Diverticulosis of large intestine without                        perforation or abscess without bleeding CPT copyright 2018 [employer]. All rights reserved. The codes documented in this report are preliminary and upon coder review may  be revised to meet current compliance requirements.  Vonda Antigua, MD Margretta Sidle B. Bonna Gains MD, MD 04/21/2018 10:47:47 AM This report has been signed  electronically. Number of Addenda: 0 Note Initiated On: 04/21/2018 9:45 AM Scope Withdrawal Time: 0 hours 21 minutes 42 seconds  Total Procedure Duration: 0 hours 26 minutes 38 seconds  Estimated Blood Loss: Estimated blood loss: none.      [facility]

## 2018-04-22 ENCOUNTER — Encounter: Payer: Self-pay | Admitting: Gastroenterology

## 2018-04-22 LAB — SURGICAL PATHOLOGY

## 2018-05-03 ENCOUNTER — Other Ambulatory Visit: Payer: Self-pay | Admitting: Internal Medicine

## 2018-05-03 DIAGNOSIS — R1084 Generalized abdominal pain: Secondary | ICD-10-CM

## 2018-05-04 ENCOUNTER — Encounter: Payer: Self-pay | Admitting: Gastroenterology

## 2018-05-07 ENCOUNTER — Emergency Department: Payer: Medicare Other

## 2018-05-07 ENCOUNTER — Inpatient Hospital Stay
Admission: EM | Admit: 2018-05-07 | Discharge: 2018-05-10 | DRG: 392 | Disposition: A | Discharge status: Home / Self Care | Payer: Medicare Other | Attending: Surgery | Admitting: Surgery

## 2018-05-07 ENCOUNTER — Other Ambulatory Visit: Payer: Self-pay

## 2018-05-07 DIAGNOSIS — Z791 Long term (current) use of non-steroidal anti-inflammatories (NSAID): Secondary | ICD-10-CM | POA: Diagnosis not present

## 2018-05-07 DIAGNOSIS — E785 Hyperlipidemia, unspecified: Secondary | ICD-10-CM | POA: Diagnosis present

## 2018-05-07 DIAGNOSIS — Z87891 Personal history of nicotine dependence: Secondary | ICD-10-CM | POA: Diagnosis not present

## 2018-05-07 DIAGNOSIS — Z79899 Other long term (current) drug therapy: Secondary | ICD-10-CM | POA: Diagnosis not present

## 2018-05-07 DIAGNOSIS — Z96612 Presence of left artificial shoulder joint: Secondary | ICD-10-CM | POA: Diagnosis present

## 2018-05-07 DIAGNOSIS — K5792 Diverticulitis of intestine, part unspecified, without perforation or abscess without bleeding: Secondary | ICD-10-CM | POA: Diagnosis not present

## 2018-05-07 DIAGNOSIS — R3915 Urgency of urination: Secondary | ICD-10-CM | POA: Diagnosis present

## 2018-05-07 DIAGNOSIS — Z833 Family history of diabetes mellitus: Secondary | ICD-10-CM

## 2018-05-07 DIAGNOSIS — N401 Enlarged prostate with lower urinary tract symptoms: Secondary | ICD-10-CM | POA: Diagnosis present

## 2018-05-07 DIAGNOSIS — K5720 Diverticulitis of large intestine with perforation and abscess without bleeding: Principal | ICD-10-CM | POA: Diagnosis present

## 2018-05-07 DIAGNOSIS — D696 Thrombocytopenia, unspecified: Secondary | ICD-10-CM | POA: Diagnosis not present

## 2018-05-07 DIAGNOSIS — K659 Peritonitis, unspecified: Secondary | ICD-10-CM

## 2018-05-07 DIAGNOSIS — E7800 Pure hypercholesterolemia, unspecified: Secondary | ICD-10-CM | POA: Diagnosis present

## 2018-05-07 DIAGNOSIS — Z8719 Personal history of other diseases of the digestive system: Secondary | ICD-10-CM

## 2018-05-07 DIAGNOSIS — Z7982 Long term (current) use of aspirin: Secondary | ICD-10-CM | POA: Diagnosis not present

## 2018-05-07 LAB — COMPREHENSIVE METABOLIC PANEL
ALBUMIN: 3.9 g/dL (ref 3.5–5.0)
ALT: 17 U/L (ref 0–44)
AST: 24 U/L (ref 15–41)
Alkaline Phosphatase: 55 U/L (ref 38–126)
Anion gap: 9 (ref 5–15)
BUN: 24 mg/dL — ABNORMAL HIGH (ref 8–23)
CHLORIDE: 106 mmol/L (ref 98–111)
CO2: 26 mmol/L (ref 22–32)
Calcium: 9.2 mg/dL (ref 8.9–10.3)
Creatinine, Ser: 1 mg/dL (ref 0.61–1.24)
GFR calc Af Amer: >60 mL/min (ref >60)
GFR calc non Af Amer: >60 mL/min (ref >60)
Glucose, Bld: 128 mg/dL — ABNORMAL HIGH (ref 70–99)
Potassium: 4.1 mmol/L (ref 3.5–5.1)
Sodium: 141 mmol/L (ref 135–145)
Total Bilirubin: 0.7 mg/dL (ref 0.3–1.2)
Total Protein: 6.4 g/dL — ABNORMAL LOW (ref 6.5–8.1)

## 2018-05-07 LAB — URINALYSIS, COMPLETE (UACMP) WITH MICROSCOPIC
Bacteria, UA: NONE SEEN
Bilirubin Urine: NEGATIVE
GLUCOSE, UA: NEGATIVE mg/dL
Hgb urine dipstick: NEGATIVE
KETONES UR: NEGATIVE mg/dL
Leukocytes,Ua: NEGATIVE
Nitrite: NEGATIVE
Protein, ur: NEGATIVE mg/dL
Specific Gravity, Urine: 1.017 (ref 1.005–1.030)
Squamous Epithelial / HPF: NONE SEEN (ref 0–5)
pH: 7 (ref 5.0–8.0)

## 2018-05-07 LAB — CBC
HCT: 46.1 % (ref 39.0–52.0)
Hemoglobin: 15.2 g/dL (ref 13.0–17.0)
MCH: 29.3 pg (ref 26.0–34.0)
MCHC: 33 g/dL (ref 30.0–36.0)
MCV: 89 fL (ref 80.0–100.0)
Platelets: 151 10*3/uL (ref 150–400)
RBC: 5.18 MIL/uL (ref 4.22–5.81)
RDW: 13 % (ref 11.5–15.5)
WBC: 11.2 10*3/uL — ABNORMAL HIGH (ref 4.0–10.5)
nRBC: 0 % (ref 0.0–0.2)

## 2018-05-07 LAB — LACTIC ACID, PLASMA: Lactic Acid, Venous: 1.5 mmol/L (ref 0.5–1.9)

## 2018-05-07 LAB — LIPASE, BLOOD: Lipase: 42 U/L (ref 11–51)

## 2018-05-07 MED ORDER — ONDANSETRON 4 MG PO TBDP: 4.0000 mg | ORAL_TABLET | Freq: Four times a day (QID) | ORAL | Status: DC | PRN

## 2018-05-07 MED ORDER — ONDANSETRON HCL 4 MG/2ML IJ SOLN: 4.0000 mg | Freq: Four times a day (QID) | INTRAMUSCULAR | Status: DC | PRN

## 2018-05-07 MED ORDER — ENOXAPARIN SODIUM 40 MG/0.4ML ~~LOC~~ SOLN
40.0000 mg | SUBCUTANEOUS | Status: DC
  Administered 2018-05-08: 40 mg via SUBCUTANEOUS
  Filled 2018-05-07: qty 0.4

## 2018-05-07 MED ORDER — TAMSULOSIN HCL 0.4 MG PO CAPS
0.4000 mg | ORAL_CAPSULE | Freq: Every day | ORAL | Status: DC
  Administered 2018-05-07 – 2018-05-10 (×4): 0.4 mg via ORAL
  Filled 2018-05-07 (×4): qty 1

## 2018-05-07 MED ORDER — KETOROLAC TROMETHAMINE 30 MG/ML IJ SOLN
30.0000 mg | Freq: Four times a day (QID) | INTRAMUSCULAR | Status: DC
  Administered 2018-05-07 (×2): 30 mg via INTRAVENOUS
  Filled 2018-05-07 (×2): qty 1

## 2018-05-07 MED ORDER — ONDANSETRON HCL 4 MG/2ML IJ SOLN
4.0000 mg | INTRAMUSCULAR | Status: AC
  Administered 2018-05-07: 4 mg via INTRAVENOUS
  Filled 2018-05-07: qty 2

## 2018-05-07 MED ORDER — SIMVASTATIN 40 MG PO TABS
40.0000 mg | ORAL_TABLET | Freq: Every day | ORAL | Status: DC
  Administered 2018-05-08 – 2018-05-09 (×2): 40 mg via ORAL
  Filled 2018-05-07 (×4): qty 1

## 2018-05-07 MED ORDER — IOHEXOL 300 MG/ML  SOLN
100.0000 mL | Freq: Once | INTRAMUSCULAR | Status: AC | PRN
  Administered 2018-05-07: 100 mL via INTRAVENOUS

## 2018-05-07 MED ORDER — PIPERACILLIN-TAZOBACTAM 3.375 G IVPB 30 MIN
3.3750 g | Freq: Once | INTRAVENOUS | Status: AC
  Administered 2018-05-07: 3.375 g via INTRAVENOUS
  Filled 2018-05-07: qty 50

## 2018-05-07 MED ORDER — SODIUM CHLORIDE 0.9 % IV BOLUS
500.0000 mL | Freq: Once | INTRAVENOUS | Status: AC
  Administered 2018-05-07: 500 mL via INTRAVENOUS

## 2018-05-07 MED ORDER — HYDROMORPHONE HCL 1 MG/ML IJ SOLN
0.5000 mg | INTRAMUSCULAR | Status: DC | PRN
  Administered 2018-05-07 (×3): 0.5 mg via INTRAVENOUS
  Filled 2018-05-07: qty 1
  Filled 2018-05-07 (×2): qty 0.5

## 2018-05-07 MED ORDER — LACTATED RINGERS IV SOLN
125.0000 mL/h | INTRAVENOUS | Status: DC
  Administered 2018-05-07 – 2018-05-10 (×9): 125 mL/h via INTRAVENOUS

## 2018-05-07 MED ORDER — KETOROLAC TROMETHAMINE 30 MG/ML IJ SOLN
15.0000 mg | Freq: Four times a day (QID) | INTRAMUSCULAR | Status: DC
  Administered 2018-05-07 – 2018-05-10 (×11): 15 mg via INTRAVENOUS
  Filled 2018-05-07 (×11): qty 1

## 2018-05-07 MED ORDER — FINASTERIDE 5 MG PO TABS
5.0000 mg | ORAL_TABLET | Freq: Every day | ORAL | Status: DC
  Administered 2018-05-07 – 2018-05-10 (×4): 5 mg via ORAL
  Filled 2018-05-07 (×4): qty 1

## 2018-05-07 MED ORDER — PIPERACILLIN-TAZOBACTAM 3.375 G IVPB
3.3750 g | Freq: Three times a day (TID) | INTRAVENOUS | Status: DC
  Administered 2018-05-07 – 2018-05-09 (×6): 3.375 g via INTRAVENOUS
  Filled 2018-05-07 (×10): qty 50

## 2018-05-07 MED ORDER — MORPHINE SULFATE (PF) 4 MG/ML IV SOLN
4.0000 mg | Freq: Once | INTRAVENOUS | Status: AC
  Administered 2018-05-07: 4 mg via INTRAVENOUS
  Filled 2018-05-07: qty 1

## 2018-05-07 MED ORDER — PANTOPRAZOLE SODIUM 40 MG IV SOLR
40.0000 mg | Freq: Every day | INTRAVENOUS | Status: DC
  Administered 2018-05-07 – 2018-05-09 (×3): 40 mg via INTRAVENOUS
  Filled 2018-05-07 (×4): qty 40
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
## 2018-05-07 NOTE — ED Notes (Signed)
Pt resting quietly on ER stretcher, requesting additional pillow, provided at this time.  Pt states pain is increasing will provide ordered pain control see MAR for documentation.  Pt and family aware of POC for admission, informed pt and family of current status of holding pts in ED due to hospital being full, verbalize understanding of information shared.  Will continue to monitor.

## 2018-05-07 NOTE — ED Notes (Signed)
Assumed care of patient reports abd pain, ivf infusing. Patient npo for gi procedure. Patient aware of plan of care. Vss. md aware of pain. Awaiting pain control order.

## 2018-05-07 NOTE — H&P (Signed)
[facility] SURGICAL HISTORY & PHYSICAL (cpt [phone])  HISTORY OF PRESENT ILLNESS (HPI):  73 y.o. male presented to [facility] ED overnight for abdominal pain. Patient reports the acute onset of LLQ abdominal pain about 8 hours prior to arrival. He described this as sharp in nature and has been constant since the onset. Nothing makes the pain better and movement makes the pain worse. He endorses associated one episode of nausea. He denied fevers, chills, CP, SOB, emesis, diarrhea, constipation, or bladder changes. He does endorse a non-bloody bowel movement since coming to the ED. He has had a history of abdominal pain for a while now however this pain today was different. He underwent a colonoscopy on 04/21/2018 with Dr Chuck Hint, MD which showed multiple ascending and sigmoid colon polyps as well as sigmoid diverticula. Previous abdominal surgeries are positive for bilateral inguinal hernia repair. Work up in the ED was concerning for perforated sigmoid diverticulitis.   General surgery is consulted by emergency medicine physician Hinda Kehr, MD for evaluation and management of perforated sigmoid diverticulitis.    PAST MEDICAL HISTORY (PMH):  Past Medical History:  Diagnosis Date  . Arthritis    hips and lower back  . Elevated cholesterol 1995  . Enlarged prostate     Reviewed. Otherwise negative.   PAST SURGICAL HISTORY ([place]):  Past Surgical History:  Procedure Laterality Date  . COLONOSCOPY WITH PROPOFOL N/A 04/21/2018   Procedure: COLONOSCOPY WITH PROPOFOL;  Surgeon: Virgel Manifold, MD;  Location: [facility] ENDOSCOPY;  Service: Endoscopy;  Laterality: N/A;  . INGUINAL HERNIA REPAIR Left 1990's  . INGUINAL HERNIA REPAIR Right 2015  . REVERSE SHOULDER ARTHROPLASTY Left 11/06/2015   Procedure: REVERSE SHOULDER ARTHROPLASTY;  Surgeon: Corky Mull, MD;  Location: [facility] ORS;  Service: Orthopedics;  Laterality: Left;    Reviewed. Otherwise negative.   MEDICATIONS:  Prior to  Admission medications   Medication Sig Start Date End Date Taking? Authorizing Provider  Black Pepper-Turmeric (TURMERIC COMPLEX/BLACK PEPPER) 3-500 MG CAPS Take by mouth.   Yes [provider]  finasteride (PROSCAR) 5 MG tablet Take 1 tablet (5 mg total) by mouth daily. 08/12/17  Yes Stoioff, Ronda Fairly, MD  Multiple Vitamin (MULTIVITAMIN WITH MINERALS) TABS tablet Take 1 tablet by mouth daily.   Yes [provider]  naproxen (NAPROSYN) 500 MG tablet Take 500 mg by mouth 2 (two) times daily as needed.   Yes [provider]  omega-3 acid ethyl esters (LOVAZA) 1 g capsule Take 1 g by mouth daily.   Yes [provider]  sildenafil (REVATIO) 20 MG tablet 2-5 tablets 1 hour prior to intercourse not to exceed 5 tablets daily 08/12/17  Yes Stoioff, Ronda Fairly, MD  simvastatin (ZOCOR) 40 MG tablet Take 40 mg by mouth daily at 6 PM.   Yes [provider]  tamsulosin (FLOMAX) 0.4 MG CAPS capsule Take 1 capsule (0.4 mg total) by mouth daily after breakfast. 02/19/18  Yes Stoioff, Ronda Fairly, MD  aspirin EC 81 MG tablet Take 81 mg by mouth every evening. At 1800    [provider]  FLUAD 0.5 ML SUSY ADM 0.5ML IM UTD 12/08/17   [provider]     ALLERGIES:  No Known Allergies   SOCIAL HISTORY:  Social History   Socioeconomic History  . Marital status: Married    Spouse name: Not on file  . Number of children: Not on file  . Years of education: Not on file  . Highest education level: Not  on file  Occupational History  . Not on file  Social Needs  . Financial resource strain: Not on file  . Food insecurity:    Worry: Not on file    Inability: Not on file  . Transportation needs:    Medical: Not on file    Non-medical: Not on file  Tobacco Use  . Smoking status: Former Smoker    Last attempt to quit: 11/02/1994    Years since quitting: 23.5  . Smokeless tobacco: Never Used  Substance and Sexual Activity  . Alcohol use: Yes    Alcohol/week:  2.0 - 3.0 standard drinks    Types: 2 - 3 Cans of beer per week  . Drug use: No  . Sexual activity: Yes  Lifestyle  . Physical activity:    Days per week: Not on file    Minutes per session: Not on file  . Stress: Not on file  Relationships  . Social connections:    Talks on phone: Not on file    Gets together: Not on file    Attends religious service: Not on file    Active member of club or organization: Not on file    Attends meetings of clubs or organizations: Not on file    Relationship status: Not on file  . Intimate partner violence:    Fear of current or ex partner: Not on file    Emotionally abused: Not on file    Physically abused: Not on file    Forced sexual activity: Not on file  Other Topics Concern  . Not on file  Social History Narrative  . Not on file     FAMILY HISTORY:  Family History  Problem Relation Age of Onset  . Diabetes Mother     Otherwise negative.   REVIEW OF SYSTEMS:  Review of Systems  Constitutional: Negative for chills and fever.  Respiratory: Negative for cough and shortness of breath.   Cardiovascular: Negative for chest pain and palpitations.  Gastrointestinal: Positive for abdominal pain and nausea. Negative for blood in stool, constipation, diarrhea and vomiting.  Genitourinary: Negative for dysuria and urgency.  Neurological: Negative for dizziness and headaches.  All other systems reviewed and are negative.   VITAL SIGNS:  Temp:  [99.5 F (37.5 C)] 99.5 F (37.5 C) (02/28 0116) Pulse Rate:  [75-98] 75 (02/28 0700) Resp:  [16-20] 18 (02/28 0700) BP: (106-151)/(66-80) 106/80 (02/28 0700) SpO2:  [93 %-100 %] 93 % (02/28 0700) Weight:  [82.6 kg] 82.6 kg (02/28 0116)     Height: 5\' 9"  (175.3 cm) Weight: 82.6 kg BMI (Calculated): 26.86   PHYSICAL EXAM:  Physical Exam Constitutional:      General: He is not in acute distress.    Appearance: He is well-developed. He is obese. He is not ill-appearing.  HENT:     Head:  Normocephalic and atraumatic.  Eyes:     General: No scleral icterus.    Extraocular Movements: Extraocular movements intact.  Cardiovascular:     Rate and Rhythm: Normal rate and regular rhythm.     Heart sounds: Normal heart sounds. No murmur. No friction rub. No gallop.   Pulmonary:     Effort: Pulmonary effort is normal. No respiratory distress.     Breath sounds: Normal breath sounds. No wheezing.  Abdominal:     General: Abdomen is protuberant. There is no distension.     Palpations: Abdomen is soft.     Tenderness: There is abdominal tenderness in the  left lower quadrant. There is no guarding or rebound.  Genitourinary:    Comments: Deferred Skin:    General: Skin is warm and dry.     Coloration: Skin is not jaundiced.  Neurological:     General: No focal deficit present.     Mental Status: He is alert and oriented to person, place, and time.  Psychiatric:        Mood and Affect: Mood normal.        Behavior: Behavior normal.     INTAKE/OUTPUT:  This shift: No intake/output data recorded.  Last 2 shifts: @IOLAST2SHIFTS @  Labs:  CBC Latest Ref Rng & Units 05/07/2018 11/07/2015 11/02/2015  WBC 4.0 - 10.5 K/uL 11.2(H) 11.7(H) 6.2  Hemoglobin 13.0 - 17.0 g/dL 15.2 13.3 15.0  Hematocrit 39.0 - 52.0 % 46.1 38.8(L) 43.8  Platelets 150 - 400 K/uL 151 137(L) 154   CMP Latest Ref Rng & Units 05/07/2018 11/07/2015 11/02/2015  Glucose 70 - 99 mg/dL 128(H) 101(H) 91  BUN 8 - 23 mg/dL 24(H) 15 20  Creatinine 0.61 - 1.24 mg/dL 1.00 1.02 0.82  Sodium 135 - 145 mmol/L 141 142 139  Potassium 3.5 - 5.1 mmol/L 4.1 4.2 4.0  Chloride 98 - 111 mmol/L 106 109 106  CO2 22 - 32 mmol/L 26 30 26   Calcium 8.9 - 10.3 mg/dL 9.2 8.9 9.1  Total Protein 6.5 - 8.1 g/dL 6.4(L) - -  Total Bilirubin 0.3 - 1.2 mg/dL 0.7 - -  Alkaline Phos 38 - 126 U/L 55 - -  AST 15 - 41 U/L 24 - -  ALT 0 - 44 U/L 17 - -    Imaging studies:   CT Abdomen/Pelvis (05/07/2018) personally reviewed and radiologist  report reviewed:   IMPRESSION: 1. Perforated sigmoid diverticulitis. No drainable fluid collection or abscess. 2. No hydronephrosis.  Assessment/Plan: (ICD-10's: K44.20) 73 y.o. hemodynamically stable male with mild leukocytosis and LLQ abdominal pain attributable to sigmoid diverticulitis with small amounts of scattered free air seen radiographically consistent with perforation, complicated by pertinent comorbidities including HLD and former tobacco abuse (smoking).    - Admit to general surgery  - NPO, IVF, IV ABx (Zosyn)  - Serial abdominal exams, monitor ongoing bowel function, vitals  - Pain control prn, antiemetics prn  - Given lack of peritoneal signs, hemodynamic stability, and clinical presentation will attempt to manage this conservatively at this time. Patient understands that if he were to deteriorate or not improve he would need surgical intervention acutely which would result in a temporizing colostomy. He and his family were understanding and agreeable   - Medical management of comorbidites    - DVT prophylaxis  All of the above findings and recommendations were discussed with the patient and his family, and all of his and his family's questions were answered to their expressed satisfaction.  -- Edison Simon, PA-C  [facility] 05/07/2018, 7:17 AM [phone] M-F: 7am - 4pm

## 2018-05-07 NOTE — ED Notes (Signed)
Report to terry.

## 2018-05-07 NOTE — Progress Notes (Signed)
Pharmacy Antibiotic Note  George Bruce is a 73 y.o. male admitted on 05/07/2018 with perforated sigmoid diverticulitis.  Pharmacy has been consulted for zosyn dosing.  Plan: Zosyn 3.375g IV q8h (4 hour infusion).  Height: 5\' 9"  (175.3 cm) Weight: 182 lb (82.6 kg) IBW/kg (Calculated) : 70.7  Temp (24hrs), Avg:99.5 F (37.5 C), Min:99.5 F (37.5 C), Max:99.5 F (37.5 C)  Recent Labs  Lab 05/07/18 0118 05/07/18 0418  WBC 11.2*  --   CREATININE 1.00  --   LATICACIDVEN  --  1.5    Estimated Creatinine Clearance: 63.8 mL/min (by C-G formula based on SCr of 1 mg/dL).    No Active Allergies  Thank you for allowing pharmacy to be a part of this patient's care.  Tobie Lords, PharmD, BCPS Clinical Pharmacist 05/07/2018

## 2018-05-07 NOTE — ED Triage Notes (Signed)
Pt in with co LLQ pain that started tonight, states has been to pmd for right flank pain. Had ultrasound done for the same and told it was neg. Pt states tonight pain is different and to left side. Pt denies any n.v.d, pt having urinary urgency and voiding small amounts at a time tonight.

## 2018-05-08 LAB — CBC WITH DIFFERENTIAL/PLATELET
Abs Immature Granulocytes: 0.02 10*3/uL (ref 0.00–0.07)
Basophils Absolute: 0 10*3/uL (ref 0.0–0.1)
Basophils Relative: 0 %
Eosinophils Absolute: 0.1 10*3/uL (ref 0.0–0.5)
Eosinophils Relative: 2 %
HCT: 38.3 % — ABNORMAL LOW (ref 39.0–52.0)
HEMOGLOBIN: 12.4 g/dL — AB (ref 13.0–17.0)
Immature Granulocytes: 0 %
Lymphocytes Relative: 14 %
Lymphs Abs: 1.1 10*3/uL (ref 0.7–4.0)
MCH: 29.3 pg (ref 26.0–34.0)
MCHC: 32.4 g/dL (ref 30.0–36.0)
MCV: 90.5 fL (ref 80.0–100.0)
MONO ABS: 0.6 10*3/uL (ref 0.1–1.0)
Monocytes Relative: 8 %
Neutro Abs: 5.6 10*3/uL (ref 1.7–7.7)
Neutrophils Relative %: 76 %
Platelets: 112 10*3/uL — ABNORMAL LOW (ref 150–400)
RBC: 4.23 MIL/uL (ref 4.22–5.81)
RDW: 13.6 % (ref 11.5–15.5)
WBC: 7.4 10*3/uL (ref 4.0–10.5)
nRBC: 0 % (ref 0.0–0.2)

## 2018-05-08 LAB — BASIC METABOLIC PANEL
Anion gap: 7 (ref 5–15)
BUN: 29 mg/dL — ABNORMAL HIGH (ref 8–23)
CALCIUM: 8.2 mg/dL — AB (ref 8.9–10.3)
CO2: 23 mmol/L (ref 22–32)
Chloride: 109 mmol/L (ref 98–111)
Creatinine, Ser: 1.26 mg/dL — ABNORMAL HIGH (ref 0.61–1.24)
GFR calc Af Amer: >60 mL/min (ref >60)
GFR calc non Af Amer: 55 mL/min — ABNORMAL LOW (ref >60)
Glucose, Bld: 87 mg/dL (ref 70–99)
Potassium: 4.1 mmol/L (ref 3.5–5.1)
Sodium: 139 mmol/L (ref 135–145)

## 2018-05-08 LAB — MAGNESIUM: Magnesium: 2.1 mg/dL (ref 1.7–2.4)

## 2018-05-08 MED ORDER — SODIUM CHLORIDE 0.9 % IV SOLN
INTRAVENOUS | Status: DC | PRN
  Administered 2018-05-08 – 2018-05-10 (×3): 250 mL via INTRAVENOUS

## 2018-05-08 MED ORDER — ALBUMIN HUMAN 25 % IV SOLN
12.5000 g | Freq: Once | INTRAVENOUS | Status: AC
  Administered 2018-05-08: 12.5 g via INTRAVENOUS
  Filled 2018-05-08: qty 50

## 2018-05-08 NOTE — Progress Notes (Signed)
CC: Diverticulitis Subjective: Reports significant improvement in pain.  Complains of mild sharp left lower quadrant pain.  No nausea or vomiting.  He is hungry. Scan personally reviewed revealed evidence of diverticulitis with microperforation.  Drainable collection. He is white count continues to improve and his vital signs have been stable.  No other complaints at this time. Mild increase creat 1.2 likely from medical rx  Objective: Vital signs in last 24 hours: Temp:  [98.4 F (36.9 C)-99.1 F (37.3 C)] 99.1 F (37.3 C) (02/29 1156) Pulse Rate:  [60-68] 60 (02/29 1156) Resp:  [16-20] 20 (02/29 1156) BP: (103-135)/(45-84) 135/75 (02/29 1156) SpO2:  [93 %-100 %] 93 % (02/29 1156) Weight:  [84.7 kg] 84.7 kg (02/28 1702)    Intake/Output from previous day: 02/28 0701 - 02/29 0700 In: 2652.4 [I.V.:2547.4; IV Piggyback:104.9] Out: 500 [Urine:500] Intake/Output this shift: No intake/output data recorded.  Physical exam: NAD, alert, good spirits Chest NSR, s1,s2 Abd: soft, mild TTP LLQ , no peritonitis, no masses Ext: no edema and well perfused Neuro: awake and alert, no motor and sens deficits  Lab Results: CBC  Recent Labs    05/07/18 0118 05/08/18 0419  WBC 11.2* 7.4  HGB 15.2 12.4*  HCT 46.1 38.3*  PLT 151 112*   BMET Recent Labs    05/07/18 0118 05/08/18 0419  NA 141 139  K 4.1 4.1  CL 106 109  CO2 26 23  GLUCOSE 128* 87  BUN 24* 29*  CREATININE 1.00 1.26*  CALCIUM 9.2 8.2*   PT/INR No results for input(s): LABPROT, INR in the last 72 hours. ABG No results for input(s): PHART, HCO3 in the last 72 hours.  Invalid input(s): PCO2, PO2  Studies/Results: Ct Abdomen Pelvis W Contrast  Result Date: 05/07/2018 CLINICAL DATA:  73 year old male with flank pain. Concern for kidney stone. EXAM: CT ABDOMEN AND PELVIS WITH CONTRAST TECHNIQUE: Multidetector CT imaging of the abdomen and pelvis was performed using the standard protocol following bolus  administration of intravenous contrast. CONTRAST:  168mL OMNIPAQUE IOHEXOL 300 MG/ML  SOLN COMPARISON:  Abdominal ultrasound dated 04/06/2018 FINDINGS: Lower chest: The visualized lung bases are clear. There is a small pneumoperitoneum. No free fluid. Hepatobiliary: A 1 cm hypodense lesion in the right lobe of the liver is not characterized but may correspond to the cyst seen on the prior ultrasound. The liver is otherwise unremarkable. No intrahepatic biliary ductal dilatation. The gallbladder is unremarkable. Pancreas: Unremarkable. No pancreatic ductal dilatation or surrounding inflammatory changes. Spleen: Normal in size without focal abnormality. Adrenals/Urinary Tract: The adrenal glands are unremarkable. There is no hydronephrosis or nephrolithiasis on either side. There is symmetric enhancement and excretion of contrast by both kidneys. The visualized ureters and urinary bladder appear unremarkable. Stomach/Bowel: There is sigmoid diverticulosis with active inflammatory changes. Small pockets of extraluminal air adjacent to the sigmoid colon consistent with perforation. A 1.8 x 1.2 cm fluid noted adjacent to the sigmoid colon. No drainable fluid collection or abscess. There is no bowel obstruction. The appendix is normal. Vascular/Lymphatic: Moderate aortoiliac atherosclerotic disease. No portal venous gas. There is no adenopathy. Reproductive: Mildly enlarged prostate gland. The seminal vesicles are symmetric. Other: None Musculoskeletal: Degenerative changes of the spine. No acute osseous pathology. Prior left inguinal hernia repair. IMPRESSION: 1. Perforated sigmoid diverticulitis. No drainable fluid collection or abscess. 2. No hydronephrosis. 3. These results were called by telephone at the time of interpretation on 05/07/2018 at 5:31 am to Dr. Hinda Kehr , who verbally acknowledged these results. Electronically  Signed   By: Anner Crete M.D.   On: 05/07/2018 05:50     Anti-infectives: Anti-infectives (From admission, onward)   Start     Dose/Rate Route Frequency Ordered Stop   05/07/18 1400  piperacillin-tazobactam (ZOSYN) IVPB 3.375 g     3.375 g 12.5 mL/hr over 240 Minutes Intravenous Every 8 hours 05/07/18 0611     05/07/18 0600  piperacillin-tazobactam (ZOSYN) IVPB 3.375 g     3.375 g 100 mL/hr over 30 Minutes Intravenous  Once 05/07/18 0550 05/07/18 0636      Assessment/Plan: Perforated diverticulitis responsive to medical rx May do clears Continue A/Bs Mobilize No Emergent surgical intervention He wil benefit from elective colectomy once infection subsides Time spent 35 min w > 50% spent in coordination and counseling of care.  Caroleen Hamman, MD, [facility]  05/08/2018

## 2018-05-09 LAB — CBC
HCT: 34.1 % — ABNORMAL LOW (ref 39.0–52.0)
Hemoglobin: 11.5 g/dL — ABNORMAL LOW (ref 13.0–17.0)
MCH: 29.3 pg (ref 26.0–34.0)
MCHC: 33.7 g/dL (ref 30.0–36.0)
MCV: 87 fL (ref 80.0–100.0)
Platelets: 88 10*3/uL — ABNORMAL LOW (ref 150–400)
RBC: 3.92 MIL/uL — ABNORMAL LOW (ref 4.22–5.81)
RDW: 13.4 % (ref 11.5–15.5)
WBC: 6.3 10*3/uL (ref 4.0–10.5)
nRBC: 1.9 % — ABNORMAL HIGH (ref 0.0–0.2)

## 2018-05-09 LAB — COMPREHENSIVE METABOLIC PANEL
ALK PHOS: 28 U/L — AB (ref 38–126)
ALT: 11 U/L (ref 0–44)
AST: 16 U/L (ref 15–41)
Albumin: 2.8 g/dL — ABNORMAL LOW (ref 3.5–5.0)
Anion gap: 9 (ref 5–15)
BUN: 18 mg/dL (ref 8–23)
CALCIUM: 8.2 mg/dL — AB (ref 8.9–10.3)
CO2: 19 mmol/L — ABNORMAL LOW (ref 22–32)
Chloride: 111 mmol/L (ref 98–111)
Creatinine, Ser: 1.26 mg/dL — ABNORMAL HIGH (ref 0.61–1.24)
GFR calc non Af Amer: 55 mL/min — ABNORMAL LOW (ref >60)
Glucose, Bld: 91 mg/dL (ref 70–99)
Potassium: 3.8 mmol/L (ref 3.5–5.1)
Sodium: 139 mmol/L (ref 135–145)
TOTAL PROTEIN: 5.2 g/dL — AB (ref 6.5–8.1)
Total Bilirubin: 1.4 mg/dL — ABNORMAL HIGH (ref 0.3–1.2)

## 2018-05-09 LAB — BASIC METABOLIC PANEL
Anion gap: 5 (ref 5–15)
BUN: 20 mg/dL (ref 8–23)
CALCIUM: 8.5 mg/dL — AB (ref 8.9–10.3)
CO2: 25 mmol/L (ref 22–32)
Chloride: 108 mmol/L (ref 98–111)
Creatinine, Ser: 1.21 mg/dL (ref 0.61–1.24)
GFR calc Af Amer: >60 mL/min (ref >60)
GFR calc non Af Amer: 58 mL/min — ABNORMAL LOW (ref >60)
Glucose, Bld: 123 mg/dL — ABNORMAL HIGH (ref 70–99)
Potassium: 4.1 mmol/L (ref 3.5–5.1)
Sodium: 138 mmol/L (ref 135–145)

## 2018-05-09 MED ORDER — SODIUM CHLORIDE 0.9% FLUSH
3.0000 mL | Freq: Two times a day (BID) | INTRAVENOUS | Status: DC
  Administered 2018-05-09 – 2018-05-10 (×2): 3 mL via INTRAVENOUS

## 2018-05-09 MED ORDER — CIPROFLOXACIN IN D5W 400 MG/200ML IV SOLN
400.0000 mg | Freq: Two times a day (BID) | INTRAVENOUS | Status: DC
  Administered 2018-05-09 – 2018-05-10 (×3): 400 mg via INTRAVENOUS
  Filled 2018-05-09 (×6): qty 200

## 2018-05-09 MED ORDER — SODIUM CHLORIDE 0.9% FLUSH: 3.0000 mL | INTRAVENOUS | Status: DC | PRN

## 2018-05-09 MED ORDER — SODIUM CHLORIDE 0.9 % IV SOLN: 250.0000 mL | INTRAVENOUS | Status: DC | PRN

## 2018-05-09 MED ORDER — METRONIDAZOLE IN NACL 5-0.79 MG/ML-% IV SOLN
500.0000 mg | Freq: Three times a day (TID) | INTRAVENOUS | Status: DC
  Administered 2018-05-09 – 2018-05-10 (×4): 500 mg via INTRAVENOUS
  Filled 2018-05-09 (×7): qty 100

## 2018-05-09 NOTE — Progress Notes (Signed)
CC: Diverticulitis Subjective: Feels okay but did have some diarrhea and some abdominal discomfort.  Wishes to stay on a clear liquid diet.  More importantly his platelets did drop 88k.  Concerned that this might be related to HIT versus thrombocytopenia induced by Zosyn He is otherwise doing well with no clinical evidence of thromboembolic events  Objective: Vital signs in last 24 hours: Temp:  [98 F (36.7 C)-100.2 F (37.9 C)] 98.4 F (36.9 C) (03/01 1227) Pulse Rate:  [47-57] 47 (03/01 1227) Resp:  [16-18] 18 (03/01 1227) BP: (122-178)/(65-80) 178/80 (03/01 1227) SpO2:  [95 %-97 %] 97 % (03/01 1227) Last BM Date: 06/06/18  Intake/Output from previous day: 02/29 0701 - 03/01 0700 In: 1037.3 [I.V.:982.6; IV Piggyback:54.8] Out: 1175 [Urine:1175] Intake/Output this shift: No intake/output data recorded.  Physical exam: NAD, alert Abd: soft, nt. No peritonitis Ext: well perfused, no hoffman or edema  Lab Results: CBC  Recent Labs    05/08/18 0419 05/09/18 0538  WBC 7.4 6.3  HGB 12.4* 11.5*  HCT 38.3* 34.1*  PLT 112* 88*   BMET Recent Labs    05/09/18 0538 05/09/18 1005  NA 139 138  K 3.8 4.1  CL 111 108  CO2 19* 25  GLUCOSE 91 123*  BUN 18 20  CREATININE 1.26* 1.21  CALCIUM 8.2* 8.5*   PT/INR No results for input(s): LABPROT, INR in the last 72 hours. ABG No results for input(s): PHART, HCO3 in the last 72 hours.  Invalid input(s): PCO2, PO2  Studies/Results: No results found.  Anti-infectives: Anti-infectives (From admission, onward)   Start     Dose/Rate Route Frequency Ordered Stop   05/09/18 0815  ciprofloxacin (CIPRO) IVPB 400 mg     400 mg 200 mL/hr over 60 Minutes Intravenous Every 12 hours 05/09/18 0805     05/09/18 0815  metroNIDAZOLE (FLAGYL) IVPB 500 mg     500 mg 100 mL/hr over 60 Minutes Intravenous Every 8 hours 05/09/18 0805     05/07/18 1400  piperacillin-tazobactam (ZOSYN) IVPB 3.375 g  Status:  Discontinued     3.375 g 12.5  mL/hr over 240 Minutes Intravenous Every 8 hours 05/07/18 0611 05/09/18 0805   05/07/18 0600  piperacillin-tazobactam (ZOSYN) IVPB 3.375 g     3.375 g 100 mL/hr over 30 Minutes Intravenous  Once 05/07/18 0550 05/07/18 0636      Assessment/Plan: Diverticulitis responding to medical management. 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 I am changing antibiotics to Cipro and Flagyl on removing the Zosyn.  I have also remove heparin products and I did send a HIT lab. There is no evidence of thromboembolic events and I do not think that there is a high pre test probability to merit the start of empiric Argatroban at this time. Continue to recheck his CBC and platelets. On the diverticulitis perspective he is doing very well and no need for surgical intervention at this time. We will keep him on clears for now. please note that I spent at least 35 minutes in this encounter with greater than 50% spent in coordination and answering of his care  Caroleen Hamman, MD, [facility]  05/09/2018

## 2018-05-09 NOTE — Progress Notes (Signed)
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 Heparin     Possible HIT pending labs    Patient Measurements: Height: 5' 9.5" (176.5 cm) Weight: 186 lb 11.7 oz (84.7 kg) IBW/kg (Calculated) : 71.85  Vital Signs: Temp: 98 F (36.7 C) (03/01 0501) Temp Source: Oral (03/01 0501) BP: 145/65 (03/01 0501) Pulse Rate: 57 (03/01 0501) Intake/Output from previous day: 02/29 0701 - 03/01 0700 In: 1037.3 [I.V.:982.6; IV Piggyback:54.8] Out: 1175 [Urine:1175] Intake/Output from this shift: No intake/output data recorded.  Labs: Recent Labs    05/07/18 0118 05/08/18 0419 05/09/18 0538  WBC 11.2* 7.4 6.3  HGB 15.2 12.4* 11.5*  HCT 46.1 38.3* 34.1*  PLT 151 112* 88*  CREATININE 1.00 1.26* 1.26*  MG  --  2.1  --   ALBUMIN 3.9  --  2.8*  PROT 6.4*  --  5.2*  AST 24  --  16  ALT 17  --  11  ALKPHOS 55  --  28*  BILITOT 0.7  --  1.4*   Estimated Creatinine Clearance: 51.5 mL/min (A) (by C-G formula based on SCr of 1.26 mg/dL (H)).  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

## 2018-05-10 DIAGNOSIS — K5792 Diverticulitis of intestine, part unspecified, without perforation or abscess without bleeding: Secondary | ICD-10-CM

## 2018-05-10 LAB — CBC
HCT: 35.1 % — ABNORMAL LOW (ref 39.0–52.0)
Hemoglobin: 11.5 g/dL — ABNORMAL LOW (ref 13.0–17.0)
MCH: 28.9 pg (ref 26.0–34.0)
MCHC: 32.8 g/dL (ref 30.0–36.0)
MCV: 88.2 fL (ref 80.0–100.0)
Platelets: 110 10*3/uL — ABNORMAL LOW (ref 150–400)
RBC: 3.98 MIL/uL — ABNORMAL LOW (ref 4.22–5.81)
RDW: 13 % (ref 11.5–15.5)
WBC: 4.4 10*3/uL (ref 4.0–10.5)
nRBC: 0 % (ref 0.0–0.2)

## 2018-05-10 MED ORDER — OXYCODONE HCL 5 MG PO TABS: 5.0000 mg | ORAL_TABLET | Freq: Four times a day (QID) | ORAL | 0 refills | Status: DC | PRN

## 2018-05-10 MED ORDER — CIPROFLOXACIN HCL 500 MG PO TABS: 500.0000 mg | ORAL_TABLET | Freq: Two times a day (BID) | ORAL | 0 refills | Status: AC

## 2018-05-10 MED ORDER — METRONIDAZOLE 500 MG PO TABS: 500.0000 mg | ORAL_TABLET | Freq: Three times a day (TID) | ORAL | 0 refills | Status: AC

## 2018-05-10 MED ORDER — IBUPROFEN 600 MG PO TABS: 600.0000 mg | ORAL_TABLET | Freq: Four times a day (QID) | ORAL | 0 refills | Status: DC | PRN

## 2018-05-10 NOTE — Progress Notes (Signed)
Discharge instructions reviewed with patient including followup visits and new medications.  Understanding was verbalized and all questions were answered.  IV removed without complication; patient tolerated well.  Patient discharged home via wheelchair in stable condition escorted by volunteer staff.  

## 2018-05-10 NOTE — Care Management Important Message (Signed)
Copy of signed Medicare IM left with patient in room. 

## 2018-05-10 NOTE — Discharge Summary (Addendum)
[facility] SURGICAL DISCHARGE SUMMARY (cpt: [phone])  Patient ID: George Bruce MRN: [id number] DOB/AGE: [birth year] 73 y.o.  Admit date: 05/07/2018 Discharge date: 05/10/2018  Discharge Diagnoses Perforated Diverticulitis  Consultants None  Procedures None  HPI: 73 y.o. male presented to [facility] ED 02/27 for abdominal pain. Patient reports the acute onset of LLQ abdominal pain about 8 hours prior to arrival. He described this as sharp in nature and has been constant since the onset. Nothing makes the pain better and movement makes the pain worse. He endorses associated one episode of nausea. He denied fevers, chills, CP, SOB, emesis, diarrhea, constipation, or bladder changes. He does endorse a non-bloody bowel movement since coming to the ED. He has had a history of abdominal pain for a while now however this pain today was different. He underwent a colonoscopy on 04/21/2018 with Dr Chuck Hint, MD which showed multiple ascending and sigmoid colon polyps as well as sigmoid diverticula. Previous abdominal surgeries are positive for bilateral inguinal hernia repair. Work up in the ED was concerning for perforated sigmoid diverticulitis.   Hospital Course: Patient was admitted to general surgery and was managed conservatively. He was made NPO and his diet was advanced gradually. His labs were trended and leukocytosis normalized. His pain improved to resolution over his hospital stay. On the day of discharge (03/02) he was tolerating a diet, mobilizing, and his pain had resolved. He will follow up in 2 weeks with Dr Hampton Abbot. All of his questions were addressed and answered.   Discharge Condition: Good   Physical Examination:  Constitutional: Well appearing male, NAD Pulmonary: Normal effort, no respiratory distress Gastrointestinal: Soft, non-tender, non-distended Skin: warm, dry   Allergies as of 05/10/2018      Reactions   Heparin    Possible HIT pending labs       Medication List    STOP taking these medications   naproxen 500 MG tablet Commonly known as:  NAPROSYN     TAKE these medications   aspirin EC 81 MG tablet Take 81 mg by mouth every evening. At 1800   ciprofloxacin 500 MG tablet Commonly known as:  CIPRO Take 1 tablet (500 mg total) by mouth 2 (two) times daily for 14 days.   finasteride 5 MG tablet Commonly known as:  PROSCAR Take 1 tablet (5 mg total) by mouth daily.   FLUAD 0.5 ML Susy Generic drug:  Influenza Vac A&B Surf Ant Adj ADM 0.5ML IM UTD   ibuprofen 600 MG tablet Commonly known as:  ADVIL,MOTRIN Take 1 tablet (600 mg total) by mouth every 6 (six) hours as needed.   metroNIDAZOLE 500 MG tablet Commonly known as:  FLAGYL Take 1 tablet (500 mg total) by mouth 3 (three) times daily for 14 days.   multivitamin with minerals Tabs tablet Take 1 tablet by mouth daily.   omega-3 acid ethyl esters 1 g capsule Commonly known as:  LOVAZA Take 1 g by mouth daily.   oxyCODONE 5 MG immediate release tablet Commonly known as:  Oxy IR/ROXICODONE Take 1 tablet (5 mg total) by mouth every 6 (six) hours as needed for severe pain.   sildenafil 20 MG tablet Commonly known as:  REVATIO 2-5 tablets 1 hour prior to intercourse not to exceed 5 tablets daily   simvastatin 40 MG tablet Commonly known as:  ZOCOR Take 40 mg by mouth daily at 6 PM.   tamsulosin 0.4 MG Caps capsule Commonly known as:  FLOMAX Take 1 capsule (0.4 mg total)  by mouth daily after breakfast.   TURMERIC COMPLEX/BLACK PEPPER 3-500 MG Caps Generic drug:  Black Pepper-Turmeric Take by mouth.        Follow-up Information    Piscoya, Jacqulyn Bath, MD. Schedule an appointment as soon as possible for a visit in 2 week(s).   Specialty:  General Surgery Why:  perforated diverticulitis Contact information: [address] [place] [address] [phone]            -- George Bruce , PA-C [facility]   05/10/2018, 2:39 PM [phone] M-F: 7am - 4pm

## 2018-05-11 LAB — HEPARIN INDUCED PLATELET AB (HIT ANTIBODY): Heparin Induced Plt Ab: 0.121 OD (ref 0.000–0.400)

## 2018-05-21 ENCOUNTER — Other Ambulatory Visit: Payer: Self-pay | Admitting: Internal Medicine

## 2018-05-21 DIAGNOSIS — R1032 Left lower quadrant pain: Secondary | ICD-10-CM

## 2018-05-26 ENCOUNTER — Ambulatory Visit (INDEPENDENT_AMBULATORY_CARE_PROVIDER_SITE_OTHER): Payer: Medicare Other | Admitting: Surgery

## 2018-05-26 ENCOUNTER — Ambulatory Visit: Admission: RE | Admit: 2018-05-26 | Payer: Medicare Other | Source: Ambulatory Visit

## 2018-05-26 ENCOUNTER — Other Ambulatory Visit: Payer: Self-pay

## 2018-05-26 ENCOUNTER — Encounter: Payer: Self-pay | Admitting: Surgery

## 2018-05-26 VITALS — BP 112/72 | HR 75 | Temp 97.7°F | Resp 16 | Ht 69.5 in | Wt 178.2 lb

## 2018-05-26 DIAGNOSIS — K5792 Diverticulitis of intestine, part unspecified, without perforation or abscess without bleeding: Secondary | ICD-10-CM | POA: Diagnosis not present

## 2018-05-26 NOTE — Progress Notes (Signed)
05/26/2018  History of Present Illness: George Bruce is a 73 y.o. male with recent hospitalization for acute diverticulitis with small abscess.  Abscess was too small to drain, and conservative management was followed.  He was discharged on 05/10/18 with Cipro/Flagyl course and presents today for follow up.  Reports that he's been doing well without any abdominal pain, nausea, or vomiting.  He is tolerating a diet and continues with soft diet.  Denies any fevers or chills.    He saw his PCP on 3/12 and a repeat CT scan was ordered.  However, the patient reports that he has not had any issues, and the CT was ordered to follow up on his episode.  He also had labs done and his WBC was normal at 6.7.  Past Medical History: Past Medical History:  Diagnosis Date  . Arthritis    hips and lower back  . Diverticulitis   . Elevated cholesterol 1995  . Enlarged prostate      Past Surgical History: Past Surgical History:  Procedure Laterality Date  . COLONOSCOPY WITH PROPOFOL N/A 04/21/2018   Procedure: COLONOSCOPY WITH PROPOFOL;  Surgeon: Virgel Manifold, MD;  Location: [facility] ENDOSCOPY;  Service: Endoscopy;  Laterality: N/A;  . INGUINAL HERNIA REPAIR Left 1990's  . INGUINAL HERNIA REPAIR Right 2015  . REVERSE SHOULDER ARTHROPLASTY Left 11/06/2015   Procedure: REVERSE SHOULDER ARTHROPLASTY;  Surgeon: Corky Mull, MD;  Location: [facility] ORS;  Service: Orthopedics;  Laterality: Left;    Home Medications: Prior to Admission medications   Medication Sig Start Date End Date Taking? Authorizing Provider  Black Pepper-Turmeric (TURMERIC COMPLEX/BLACK PEPPER) 3-500 MG CAPS Take by mouth.   Yes [provider]  finasteride (PROSCAR) 5 MG tablet Take 1 tablet (5 mg total) by mouth daily. 08/12/17  Yes Stoioff, Ronda Fairly, MD  ibuprofen (ADVIL,MOTRIN) 600 MG tablet Take 1 tablet (600 mg total) by mouth every 6 (six) hours as needed. 05/10/18  Yes Tylene Fantasia, PA-C  Multiple Vitamin (MULTIVITAMIN  WITH MINERALS) TABS tablet Take 1 tablet by mouth daily.   Yes [provider]  omega-3 acid ethyl esters (LOVAZA) 1 g capsule Take 1 g by mouth daily.   Yes [provider]  oxyCODONE (OXY IR/ROXICODONE) 5 MG immediate release tablet Take 1 tablet (5 mg total) by mouth every 6 (six) hours as needed for severe pain. 05/10/18  Yes Edison Simon R, PA-C  sildenafil (REVATIO) 20 MG tablet 2-5 tablets 1 hour prior to intercourse not to exceed 5 tablets daily 08/12/17  Yes Stoioff, Ronda Fairly, MD  simvastatin (ZOCOR) 40 MG tablet Take 40 mg by mouth daily at 6 PM.   Yes [provider]  tamsulosin (FLOMAX) 0.4 MG CAPS capsule Take 1 capsule (0.4 mg total) by mouth daily after breakfast. 02/19/18  Yes Stoioff, Ronda Fairly, MD    Allergies: Allergies  Allergen Reactions  . Heparin     Possible HIT pending labs    Review of Systems: Review of Systems  Constitutional: Negative for chills and fever.  Respiratory: Negative for shortness of breath.   Cardiovascular: Negative for chest pain.  Gastrointestinal: Negative for abdominal pain, blood in stool, constipation, diarrhea, nausea and vomiting.  Genitourinary: Negative for dysuria.    Physical Exam BP 112/72   Pulse 75   Temp 97.7 F (36.5 C) (Temporal)   Resp 16   Ht 5' 9.5" (1.765 m)   Wt 178 lb 3.2 oz (80.8 kg)   SpO2 95%  BMI 25.94 kg/m  CONSTITUTIONAL: No acute distress HEENT:  Normocephalic, atraumatic, extraocular motion intact. RESPIRATORY:  Lungs are clear, and breath sounds are equal bilaterally. Normal respiratory effort without pathologic use of accessory muscles. CARDIOVASCULAR: Heart is regular without murmurs, gallops, or rubs. GI: The abdomen is soft, non-distended, non-tender to palpation. There were no palpable masses.  NEUROLOGIC:  Motor and sensation is grossly normal.  Cranial nerves are grossly intact. PSYCH:  Alert and oriented to person, place and time. Affect is  normal.  Labs/Imaging: Labs from 05/20/18: WBC 6.7, Hgb 14.9, Hct 45, Plt 235.  Na 139, K 4.5, Cl 105, CO2 28.3, BUN 15, Cr 1.0.  Tbili 0.5, AST 23, ALT 16, Alk Phos 40.  Assessment and Plan: This is a 73 y.o. male with acute diverticulitis with small abscess.  Clinically the patient is doing very well and there is no indication at this point of any complication or recurrence.  Discussed with the patient that from my standpoint, we do not need a repeat CT scan unless there are any recurrent symptoms, so the CT that he had ordered for today can be canceled.    Discussed with him the role for surgery given his episode of diverticulitis with abscess.   He is interested in surgery and understands that we want to wait at least 8 weeks since his episode to allow all the inflammation to calm down.  He had a colonoscopy last month so he does not need another one prior to surgery.  He will follow up with me in 6 weeks to discuss surgery and set up a date.  Return precautions given.  Face-to-face time spent with the patient and care providers was 15 minutes, with more than 50% of the time spent counseling, educating, and coordinating care of the patient.     Melvyn Neth, [facility]

## 2018-05-26 NOTE — Patient Instructions (Addendum)
Please see your appointment listed below.  We will discuss your surgery at this appointment.      Diverticulitis  Diverticulitis is when small pockets in your large intestine (colon) get infected or swollen. This causes stomach pain and watery poop (diarrhea). These pouches are called diverticula. They form in people who have a condition called diverticulosis. Follow these instructions at home: Medicines  Take over-the-counter and prescription medicines only as told by your doctor. These include: ? Antibiotics. ? Pain medicines. ? Fiber pills. ? Probiotics. ? Stool softeners.  Do not drive or use heavy machinery while taking prescription pain medicine.  If you were prescribed an antibiotic, take it as told. Do not stop taking it even if you feel better. General instructions   Follow a diet as told by your doctor.  When you feel better, your doctor may tell you to change your diet. You may need to eat a lot of fiber. Fiber makes it easier to poop (have bowel movements). Healthy foods with fiber include: ? Berries. ? Beans. ? Lentils. ? Green vegetables.  Exercise 3 or more times a week. Aim for 30 minutes each time. Exercise enough to sweat and make your heart beat faster.  Keep all follow-up visits as told. This is important. You may need to have an exam of the large intestine. This is called a colonoscopy. Contact a doctor if:  Your pain does not get better.  You have a hard time eating or drinking.  You are not pooping like normal. Get help right away if:  Your pain gets worse.  Your problems do not get better.  Your problems get worse very fast.  You have a fever.  You throw up (vomit) more than one time.  You have poop that is: ? Bloody. ? Black. ? Tarry. Summary  Diverticulitis is when small pockets in your large intestine (colon) get infected or swollen.  Take medicines only as told by your doctor.  Follow a diet as told by your doctor. This  information is not intended to replace advice given to you by your health care provider. Make sure you discuss any questions you have with your health care provider. Document Released: 08/13/2007 Document Revised: 03/13/2016 Document Reviewed: 03/13/2016 Elsevier Interactive Patient Education  2019 [employer].

## 2018-07-07 ENCOUNTER — Ambulatory Visit: Payer: Self-pay | Admitting: Surgery

## 2018-07-09 ENCOUNTER — Telehealth: Payer: Self-pay | Admitting: Surgery

## 2018-07-30 ENCOUNTER — Ambulatory Visit: Payer: Self-pay | Admitting: Surgery

## 2018-08-03 ENCOUNTER — Other Ambulatory Visit: Payer: Self-pay

## 2018-08-03 DIAGNOSIS — R3914 Feeling of incomplete bladder emptying: Secondary | ICD-10-CM

## 2018-08-03 DIAGNOSIS — N401 Enlarged prostate with lower urinary tract symptoms: Secondary | ICD-10-CM

## 2018-08-03 DIAGNOSIS — N5201 Erectile dysfunction due to arterial insufficiency: Secondary | ICD-10-CM

## 2018-08-03 MED ORDER — SILDENAFIL CITRATE 20 MG PO TABS: ORAL_TABLET | ORAL | 2 refills | Status: DC

## 2018-08-03 MED ORDER — FINASTERIDE 5 MG PO TABS: 5.0000 mg | ORAL_TABLET | Freq: Every day | ORAL | 3 refills | Status: DC

## 2018-08-13 ENCOUNTER — Ambulatory Visit: Payer: Self-pay | Admitting: Surgery

## 2018-11-01 ENCOUNTER — Telehealth: Payer: Self-pay | Admitting: *Deleted

## 2018-11-01 NOTE — Telephone Encounter (Signed)
-----   Message from Olean Ree, MD sent at 10/29/2018  3:24 PM EDT ----- Regarding: follow up Hi,  This patient was hospitalized earlier in the year before COVID hit with diverticulitis.  He had been interested in surgery but with COVID, we had to postpone his preop appointment.  He had an appointment scheduled with me for 08/13/18 but he canceled it.  Can you check with him if he's still interested in surgery, and if so, I'd be happy to see him when I get back in September?  If he's not interested, that's fine, just thought I would check.  Thanks,  [employer]

## 2018-11-01 NOTE — Telephone Encounter (Signed)
Patient called stated that he wants to hold off on surgery for now and will call when he is ready to get things going.

## 2018-11-01 NOTE — Telephone Encounter (Signed)
Called patient's home number and spoke with wife. She states patient is not home but will have the patient call the office back.

## 2018-12-23 ENCOUNTER — Other Ambulatory Visit: Payer: Self-pay

## 2018-12-23 DIAGNOSIS — Z20822 Contact with and (suspected) exposure to covid-19: Secondary | ICD-10-CM

## 2018-12-24 LAB — NOVEL CORONAVIRUS, NAA: SARS-CoV-2, NAA: NOT DETECTED

## 2018-12-28 ENCOUNTER — Telehealth: Payer: Self-pay | Admitting: Internal Medicine

## 2018-12-28 NOTE — Telephone Encounter (Signed)
Negative COVID results given. Patient results "NOT Detected." Caller expressed understanding. ° °

## 2019-02-21 ENCOUNTER — Other Ambulatory Visit: Payer: Self-pay | Admitting: *Deleted

## 2019-02-21 DIAGNOSIS — R972 Elevated prostate specific antigen [PSA]: Secondary | ICD-10-CM

## 2019-02-22 ENCOUNTER — Other Ambulatory Visit: Payer: Medicare Other

## 2019-02-22 ENCOUNTER — Other Ambulatory Visit: Payer: Self-pay

## 2019-02-22 DIAGNOSIS — R972 Elevated prostate specific antigen [PSA]: Secondary | ICD-10-CM

## 2019-02-23 LAB — PSA: Prostate Specific Ag, Serum: 2.3 ng/mL (ref 0.0–4.0)

## 2019-02-24 ENCOUNTER — Other Ambulatory Visit: Payer: Self-pay

## 2019-02-24 ENCOUNTER — Ambulatory Visit (INDEPENDENT_AMBULATORY_CARE_PROVIDER_SITE_OTHER): Payer: Medicare Other | Admitting: Urology

## 2019-02-24 ENCOUNTER — Encounter: Payer: Self-pay | Admitting: Urology

## 2019-02-24 VITALS — BP 119/62 | HR 71 | Ht 69.0 in | Wt 185.0 lb

## 2019-02-24 DIAGNOSIS — N401 Enlarged prostate with lower urinary tract symptoms: Secondary | ICD-10-CM

## 2019-02-24 DIAGNOSIS — R3914 Feeling of incomplete bladder emptying: Secondary | ICD-10-CM | POA: Diagnosis not present

## 2019-02-24 DIAGNOSIS — R972 Elevated prostate specific antigen [PSA]: Secondary | ICD-10-CM | POA: Diagnosis not present

## 2019-02-24 DIAGNOSIS — N5201 Erectile dysfunction due to arterial insufficiency: Secondary | ICD-10-CM | POA: Diagnosis not present

## 2019-02-24 LAB — BLADDER SCAN AMB NON-IMAGING

## 2019-02-24 NOTE — Progress Notes (Signed)
02/24/2019 1:04 PM   George Bruce 02/20/1946 [id number]  Referring provider: Idelle Crouch, MD [place] [facility] [place],  [place] [address]  Chief Complaint  Patient presents with  . Elevated PSA    59month    Urologic history: 1.  Elevated PSA -Prostate biopsy 03/2016; PSA 4.1; prostate volume 73 cc; benign pathology -MRI prostate 06/2016 PSA 6.16; no suspicious lesions  2.  BPH with lower urinary tract symptoms -Finasteride/tamsulosin -Incomplete emptying; PVR ~200 mL  3.  Erectile dysfunction -Generic sildenafil  HPI: 73 y.o. male presents for annual follow-up.  He remains on tamsulosin and finasteride.  He has noted slight decrease urinary hesitancy since his visit last year but at times voids with a good stream.  Denies dysuria, gross hematuria or flank, abdominal, pelvic pain.  He continues generic sildenafil for ED with good efficacy.  PSA drawn 02/22/2019 was 2.3 (uncorrected).  He was admitted to the hospital in early March for perforated diverticulum which was managed conservatively.   PMH: Past Medical History:  Diagnosis Date  . Arthritis    hips and lower back  . Diverticulitis   . Elevated cholesterol 1995  . Enlarged prostate   . Hx of basal cell carcinoma 12/21/2014   multiple sites   . 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 COLONOSCOPY WITH PROPOFOL N/A 04/21/2018   Procedure: COLONOSCOPY WITH PROPOFOL;  Surgeon: Virgel Manifold, MD;  Location: [facility] ENDOSCOPY;  Service: Endoscopy;  Laterality: N/A;  . INGUINAL HERNIA REPAIR Left 1990's  . INGUINAL HERNIA REPAIR Right 2015  . REVERSE SHOULDER ARTHROPLASTY Left 11/06/2015   Procedure: REVERSE SHOULDER ARTHROPLASTY;  Surgeon: Corky Mull, MD;  Location: [facility] ORS;  Service: Orthopedics;  Laterality: Left;    Home Medications:  Allergies as of 02/24/2019   No  Known Allergies     Medication List       Accurate as of February 24, 2019  1:04 PM. If you have any questions, ask your nurse or doctor.        finasteride 5 MG tablet Commonly known as: PROSCAR Take 1 tablet (5 mg total) by mouth daily.   ibuprofen 600 MG tablet Commonly known as: ADVIL Take 1 tablet (600 mg total) by mouth every 6 (six) hours as needed.   multivitamin with minerals Tabs tablet Take 1 tablet by mouth daily.   omega-3 acid ethyl esters 1 g capsule Commonly known as: LOVAZA Take 1 g by mouth daily.   oxyCODONE 5 MG immediate release tablet Commonly known as: Oxy IR/ROXICODONE Take 1 tablet (5 mg total) by mouth every 6 (six) hours as needed for severe pain.   sildenafil 20 MG tablet Commonly known as: REVATIO 2-5 tablets 1 hour prior to intercourse not to exceed 5 tablets daily   simvastatin 40 MG tablet Commonly known as: ZOCOR Take 40 mg by mouth daily at 6 PM.   tamsulosin 0.4 MG Caps capsule Commonly known as: FLOMAX Take 1 capsule (0.4 mg total) by mouth daily after breakfast.   Turmeric Complex/Black Pepper 3-500 MG Caps Generic drug: Black Pepper-Turmeric Take by mouth.       Allergies: No Known Allergies  Family History: Family History  Problem Relation Age of Onset  . Diabetes Mother     Social History:  reports that he quit smoking about 24 years ago. He has never used  smokeless tobacco. He reports current alcohol use of about 2.0 - 3.0 standard drinks of alcohol per week. He reports that he does not use drugs.  ROS: UROLOGY Frequent Urination?: No Hard to postpone urination?: No Burning/pain with urination?: No Get up at night to urinate?: No Leakage of urine?: No Urine stream starts and stops?: No Trouble starting stream?: No Do you have to strain to urinate?: No Blood in urine?: No Urinary tract infection?: No Sexually transmitted disease?: No Injury to kidneys or bladder?: No Painful intercourse?: No Weak stream?:  Yes Erection problems?: No Penile pain?: No  Gastrointestinal Nausea?: No Vomiting?: No Indigestion/heartburn?: No Diarrhea?: No Constipation?: No  Constitutional Fever: No Night sweats?: No Weight loss?: No Fatigue?: No  Skin Skin rash/lesions?: No Itching?: No  Eyes Blurred vision?: No Double vision?: No  Ears/Nose/Throat Sore throat?: No Sinus problems?: No  Hematologic/Lymphatic Swollen glands?: No Easy bruising?: No  Cardiovascular Leg swelling?: No Chest pain?: No  Respiratory Cough?: No Shortness of breath?: No  Endocrine Excessive thirst?: No  Musculoskeletal Back pain?: No Joint pain?: No  Neurological Headaches?: No Dizziness?: No  Psychologic Depression?: No Anxiety?: No  Physical Exam: BP 119/62   Pulse 71   Ht 5\' 9"  (1.753 m)   Wt 185 lb (83.9 kg)   BMI 27.32 kg/m   Constitutional:  Alert and oriented, No acute distress. HEENT: [place] AT, moist mucus membranes.  Trachea midline, no masses. Cardiovascular: No clubbing, cyanosis, or edema. Respiratory: Normal respiratory effort, no increased work of breathing. GU: Prostate 40 g, smooth without nodules Skin: No rashes, bruises or suspicious lesions. Neurologic: Grossly intact, no focal deficits, moving all 4 extremities. Psychiatric: Normal mood and affect.   Assessment & Plan:    - BPH with lower urinary tract symptoms Slight increased urinary hesitancy though not bothersome.  He will continue tamsulosin/finasteride.  Surgical options including UroLift were discussed.  He desires to continue medical management.  PVR by bladder scan today 45 minutes after voiding was stable at 284.  - Elevated PSA Benign DRE/stable PSA.  Continue annual follow-up  - Erectile dysfunction Continue generic sildenafil   Abbie Sons, MD  [facility] [address], [place], [place] [address] [phone]

## 2019-03-07 ENCOUNTER — Other Ambulatory Visit: Payer: Self-pay | Admitting: Urology

## 2019-03-07 DIAGNOSIS — N401 Enlarged prostate with lower urinary tract symptoms: Secondary | ICD-10-CM

## 2019-03-07 DIAGNOSIS — R3914 Feeling of incomplete bladder emptying: Secondary | ICD-10-CM

## 2019-05-19 ENCOUNTER — Telehealth: Payer: Self-pay | Admitting: Urology

## 2019-05-19 NOTE — Telephone Encounter (Signed)
Pt states pharm has changed from [facility] over to [facility], pt needs this to be changed in his chart and refills sent. Please advise pt at [phone] or cell @ [phone]

## 2019-05-23 ENCOUNTER — Other Ambulatory Visit: Payer: Self-pay | Admitting: *Deleted

## 2019-05-23 DIAGNOSIS — N401 Enlarged prostate with lower urinary tract symptoms: Secondary | ICD-10-CM

## 2019-05-23 MED ORDER — FINASTERIDE 5 MG PO TABS: 5.0000 mg | ORAL_TABLET | Freq: Every day | ORAL | 3 refills | Status: DC

## 2019-05-23 MED ORDER — TAMSULOSIN HCL 0.4 MG PO CAPS: ORAL_CAPSULE | ORAL | 3 refills | Status: DC

## 2019-05-23 NOTE — Telephone Encounter (Signed)
Patient's wife called the office this morning.  She clarified that, due to a change in their insurance, they need a refill of the following medications sent to the [facility] on [address] in [place]:  FINASTERIDE TAMSULOSIN  Please contact the patient (or his wife) with any questions or concerns.

## 2019-08-16 ENCOUNTER — Other Ambulatory Visit: Payer: Self-pay | Admitting: Internal Medicine

## 2019-08-16 DIAGNOSIS — R1032 Left lower quadrant pain: Secondary | ICD-10-CM

## 2020-01-19 ENCOUNTER — Other Ambulatory Visit: Payer: Self-pay

## 2020-01-19 ENCOUNTER — Ambulatory Visit (INDEPENDENT_AMBULATORY_CARE_PROVIDER_SITE_OTHER): Payer: Medicare Other | Admitting: Dermatology

## 2020-01-19 DIAGNOSIS — L821 Other seborrheic keratosis: Secondary | ICD-10-CM | POA: Diagnosis not present

## 2020-01-19 DIAGNOSIS — L570 Actinic keratosis: Secondary | ICD-10-CM | POA: Diagnosis not present

## 2020-01-19 DIAGNOSIS — L820 Inflamed seborrheic keratosis: Secondary | ICD-10-CM | POA: Diagnosis not present

## 2020-01-19 DIAGNOSIS — L814 Other melanin hyperpigmentation: Secondary | ICD-10-CM

## 2020-01-19 DIAGNOSIS — L578 Other skin changes due to chronic exposure to nonionizing radiation: Secondary | ICD-10-CM

## 2020-01-19 DIAGNOSIS — D1800 Hemangioma unspecified site: Secondary | ICD-10-CM

## 2020-01-19 DIAGNOSIS — D229 Melanocytic nevi, unspecified: Secondary | ICD-10-CM

## 2020-01-19 DIAGNOSIS — Z85828 Personal history of other malignant neoplasm of skin: Secondary | ICD-10-CM | POA: Diagnosis not present

## 2020-01-19 DIAGNOSIS — Z1283 Encounter for screening for malignant neoplasm of skin: Secondary | ICD-10-CM | POA: Diagnosis not present

## 2020-01-19 NOTE — Progress Notes (Signed)
Follow-Up Visit   Subjective  George Bruce is a 74 y.o. male who presents for the following: Annual Exam (Pt presents for TBSE yearly exam, Hx of BCC ). The patient presents for Total-Body Skin Exam (TBSE) for skin cancer screening and mole check.  The following portions of the chart were reviewed this encounter and updated as appropriate:  Tobacco  Allergies  Meds  Problems  Med Hx  Surg Hx  Fam Hx     Review of Systems:  No other skin or systemic complaints except as noted in HPI or Assessment and Plan.  Objective  Well appearing patient in no apparent distress; mood and affect are within normal limits.  A full examination was performed including scalp, head, eyes, ears, nose, lips, neck, chest, axillae, abdomen, back, buttocks, bilateral upper extremities, bilateral lower extremities, hands, feet, fingers, toes, fingernails, and toenails. All findings within normal limits unless otherwise noted below.  Objective  multiple see history: Well healed scar with no evidence of recurrence.   Objective  face, R ear (7): Erythematous thin papules/macules with gritty scale.   Objective  Left Lower Leg - Anterior: Erythematous keratotic or waxy stuck-on papule or plaque.    Assessment & Plan  History of basal cell carcinoma (BCC) multiple see history  Clear. Observe for recurrence. Call clinic for new or changing lesions.  Recommend regular skin exams, daily broad-spectrum spf 30+ sunscreen use, and photoprotection.     AK (actinic keratosis) (7) face, R ear  Destruction of lesion - face, R ear Complexity: simple   Destruction method: cryotherapy   Informed consent: discussed and consent obtained   Timeout:  patient name, date of birth, surgical site, and procedure verified Lesion destroyed using liquid nitrogen: Yes   Region frozen until ice ball extended beyond lesion: Yes   Outcome: patient tolerated procedure well with no complications   Post-procedure details:  wound care instructions given    Inflamed seborrheic keratosis Left Lower Leg - Anterior  Destruction of lesion - Left Lower Leg - Anterior Complexity: simple   Destruction method: cryotherapy   Informed consent: discussed and consent obtained   Timeout:  patient name, date of birth, surgical site, and procedure verified Lesion destroyed using liquid nitrogen: Yes   Region frozen until ice ball extended beyond lesion: Yes   Outcome: patient tolerated procedure well with no complications   Post-procedure details: wound care instructions given    Skin cancer screening  Lentigines - Scattered tan macules - Discussed due to sun exposure - Benign, observe - Call for any changes  Seborrheic Keratoses - Stuck-on, waxy, tan-brown papules and plaques  - Discussed benign etiology and prognosis. - Observe - Call for any changes  Melanocytic Nevi - Tan-brown and/or pink-flesh-colored symmetric macules and papules - Benign appearing on exam today - Observation - Call clinic for new or changing moles - Recommend daily use of broad spectrum spf 30+ sunscreen to sun-exposed areas.   Hemangiomas - Red papules - Discussed benign nature - Observe - Call for any changes  Actinic Damage - Chronic, secondary to cumulative UV/sun exposure - diffuse scaly erythematous macules with underlying dyspigmentation - Recommend daily broad spectrum sunscreen SPF 30+ to sun-exposed areas, reapply every 2 hours as needed.  - Call for new or changing lesions.  Skin cancer screening performed today.  Return in about 1 year (around 01/18/2021) for tbse.  Marta Lamas, CMA, am acting as scribe for Sarina Ser, MD .  Documentation: I have reviewed  the above documentation for accuracy and completeness, and I agree with the above.  Sarina Ser, MD

## 2020-01-24 ENCOUNTER — Encounter: Payer: Self-pay | Admitting: Dermatology

## 2020-03-01 ENCOUNTER — Ambulatory Visit: Payer: Medicare Other | Admitting: Urology

## 2020-03-07 ENCOUNTER — Ambulatory Visit (INDEPENDENT_AMBULATORY_CARE_PROVIDER_SITE_OTHER): Payer: Medicare Other | Admitting: Urology

## 2020-03-07 ENCOUNTER — Other Ambulatory Visit: Payer: Self-pay

## 2020-03-07 ENCOUNTER — Encounter: Payer: Self-pay | Admitting: Urology

## 2020-03-07 VITALS — BP 142/78 | Ht 69.0 in | Wt 182.0 lb

## 2020-03-07 DIAGNOSIS — R3914 Feeling of incomplete bladder emptying: Secondary | ICD-10-CM | POA: Diagnosis not present

## 2020-03-07 DIAGNOSIS — R972 Elevated prostate specific antigen [PSA]: Secondary | ICD-10-CM

## 2020-03-07 DIAGNOSIS — N401 Enlarged prostate with lower urinary tract symptoms: Secondary | ICD-10-CM

## 2020-03-07 DIAGNOSIS — N5201 Erectile dysfunction due to arterial insufficiency: Secondary | ICD-10-CM

## 2020-03-07 LAB — URINALYSIS, COMPLETE
Bilirubin, UA: NEGATIVE
Glucose, UA: NEGATIVE
Ketones, UA: NEGATIVE
Nitrite, UA: NEGATIVE
Protein,UA: NEGATIVE
RBC, UA: NEGATIVE
Specific Gravity, UA: 1.015 (ref 1.005–1.030)
Urobilinogen, Ur: 0.2 mg/dL (ref 0.2–1.0)
pH, UA: 7 (ref 5.0–7.5)

## 2020-03-07 LAB — MICROSCOPIC EXAMINATION
Bacteria, UA: NONE SEEN
Epithelial Cells (non renal): NONE SEEN /hpf (ref 0–10)

## 2020-03-07 LAB — BLADDER SCAN AMB NON-IMAGING: Scan Result: 176

## 2020-03-07 MED ORDER — TADALAFIL 20 MG PO TABS: ORAL_TABLET | ORAL | 0 refills | Status: DC

## 2020-03-07 NOTE — Progress Notes (Signed)
03/07/2020 10:33 AM   George Bruce September 21, 1945 [id number]  Referring provider: Marguarite Arbour, MD [address] [facility] [place],  [place] [address]  Chief Complaint  Patient presents with  . Benign Prostatic Hypertrophy    Urologic history: 1. Elevated PSA -Prostate biopsy 03/2016; PSA 4.1; prostate volume 73 cc; benign pathology -MRI prostate 06/2016 PSA 6.16; no suspicious lesions  2. BPH with lower urinary tract symptoms -Finasteride/tamsulosin -Incomplete emptying; PVR~200 mL  3. Erectile dysfunction -Generic sildenafil   HPI: 74 y.o. male presents for annual follow-up.   Stable LUTS on tamsulosin/finasteride  Denies dysuria, gross hematuria  Denies flank, abdominal, pelvic pain  ED stable on sildenafil however he is interested in a trial of generic tadalafil  PSA drawn 09/2019 with PCP stable 2.9 (uncorrected)   PMH: Past Medical History:  Diagnosis Date  . Arthritis    hips and lower back  . Diverticulitis   . Elevated cholesterol 1995  . Enlarged prostate   . Hx of basal cell carcinoma 12/21/2014   multiple sites   . 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 COLONOSCOPY WITH PROPOFOL N/A 04/21/2018   Procedure: COLONOSCOPY WITH PROPOFOL;  Surgeon: Pasty Spillers, MD;  Location: [facility] ENDOSCOPY;  Service: Endoscopy;  Laterality: N/A;  . INGUINAL HERNIA REPAIR Left 1990's  . INGUINAL HERNIA REPAIR Right 2015  . REVERSE SHOULDER ARTHROPLASTY Left 11/06/2015   Procedure: REVERSE SHOULDER ARTHROPLASTY;  Surgeon: Christena Flake, MD;  Location: [facility] ORS;  Service: Orthopedics;  Laterality: Left;    Home Medications:  Allergies as of 03/07/2020   No Known Allergies     Medication List       Accurate as of March 07, 2020 10:33 AM. If you have any questions, ask your nurse or doctor.        finasteride 5 MG  tablet Commonly known as: PROSCAR Take 1 tablet (5 mg total) by mouth daily.   ibuprofen 600 MG tablet Commonly known as: ADVIL Take 1 tablet (600 mg total) by mouth every 6 (six) hours as needed.   multivitamin with minerals Tabs tablet Take 1 tablet by mouth daily.   omega-3 acid ethyl esters 1 g capsule Commonly known as: LOVAZA Take 1 g by mouth daily.   oxyCODONE 5 MG immediate release tablet Commonly known as: Oxy IR/ROXICODONE Take 1 tablet (5 mg total) by mouth every 6 (six) hours as needed for severe pain.   sildenafil 20 MG tablet Commonly known as: REVATIO 2-5 tablets 1 hour prior to intercourse not to exceed 5 tablets daily   simvastatin 40 MG tablet Commonly known as: ZOCOR Take 40 mg by mouth daily at 6 PM.   tamsulosin 0.4 MG Caps capsule Commonly known as: FLOMAX TAKE 1 CAPSULE DAILY AFTER BREAKFAST   Turmeric Complex/Black Pepper 3-500 MG Caps Generic drug: Black Pepper-Turmeric Take by mouth.       Allergies: No Known Allergies  Family History: Family History  Problem Relation Age of Onset  . Diabetes Mother     Social History:  reports that he quit smoking about 25 years ago. He has never used smokeless tobacco. He reports current alcohol use of about 2.0 - 3.0 standard drinks of alcohol per week. He reports that he does not use drugs.   Physical Exam: BP (!) 142/78   Ht 5\' 9"  (1.753 m)   Wt  182 lb (82.6 kg)   BMI 26.88 kg/m   Constitutional:  Alert and oriented, No acute distress. HEENT: [place] AT, moist mucus membranes.  Trachea midline, no masses. Cardiovascular: No clubbing, cyanosis, or edema. Respiratory: Normal respiratory effort, no increased work of breathing. Skin: No rashes, bruises or suspicious lesions. Neurologic: Grossly intact, no focal deficits, moving all 4 extremities. Psychiatric: Normal mood and affect.  Laboratory Data:  Urinalysis Dipstick 1+ leukocytes Microscopy negative   Assessment & Plan:    1. Benign  prostatic hyperplasia with incomplete bladder emptying  Stable LUTS on combination therapy  2.  Elevated PSA  Stable  3.  Erectile dysfunction  Trial tadalafil 20 mg 1 hour prior to intercourse  Continue annual follow-up   Abbie Sons, MD  [facility] [address], [place], [place] [address] [phone]

## 2020-03-08 ENCOUNTER — Ambulatory Visit: Payer: Medicare Other | Admitting: Urology

## 2020-04-04 ENCOUNTER — Ambulatory Visit (INDEPENDENT_AMBULATORY_CARE_PROVIDER_SITE_OTHER): Payer: Medicare Other | Admitting: Dermatology

## 2020-04-04 ENCOUNTER — Other Ambulatory Visit: Payer: Self-pay

## 2020-04-04 DIAGNOSIS — L578 Other skin changes due to chronic exposure to nonionizing radiation: Secondary | ICD-10-CM

## 2020-04-04 DIAGNOSIS — L821 Other seborrheic keratosis: Secondary | ICD-10-CM | POA: Diagnosis not present

## 2020-04-04 DIAGNOSIS — L820 Inflamed seborrheic keratosis: Secondary | ICD-10-CM | POA: Diagnosis not present

## 2020-04-04 NOTE — Progress Notes (Signed)
   Follow-Up Visit   Subjective  George Bruce is a 75 y.o. male who presents for the following: Other (Spot of right forearm that seems sore once in a while).  The following portions of the chart were reviewed this encounter and updated as appropriate:   Tobacco  Allergies  Meds  Problems  Med Hx  Surg Hx  Fam Hx     Review of Systems:  No other skin or systemic complaints except as noted in HPI or Assessment and Plan.  Objective  Well appearing patient in no apparent distress; mood and affect are within normal limits.  A focused examination was performed including right forearm,face,neck . Relevant physical exam findings are noted in the Assessment and Plan.  Objective  Right Forearm - Anterior: Erythematous keratotic or waxy stuck-on papule or plaque.    Assessment & Plan  Inflamed seborrheic keratosis Right Forearm - Anterior  Destruction of lesion - Right Forearm - Anterior Complexity: simple   Destruction method: cryotherapy   Informed consent: discussed and consent obtained   Timeout:  patient name, date of birth, surgical site, and procedure verified Lesion destroyed using liquid nitrogen: Yes   Region frozen until ice ball extended beyond lesion: Yes   Outcome: patient tolerated procedure well with no complications   Post-procedure details: wound care instructions given    Seborrheic Keratoses - Stuck-on, waxy, tan-brown papules and plaques  - Discussed benign etiology and prognosis. - Observe - Call for any changes  Actinic Damage - chronic, secondary to cumulative UV radiation exposure/sun exposure over time - diffuse scaly erythematous macules with underlying dyspigmentation - Recommend daily broad spectrum sunscreen SPF 30+ to sun-exposed areas, reapply every 2 hours as needed.  - Call for new or changing lesions.  Return for as scheduled in November 2022.  IMarye Round, CMA, am acting as scribe for Sarina Ser, MD .  Documentation: I  have reviewed the above documentation for accuracy and completeness, and I agree with the above.  Sarina Ser, MD

## 2020-04-04 NOTE — Patient Instructions (Signed)
Cryotherapy Aftercare  . Wash gently with soap and water everyday.   . Apply Vaseline and Band-Aid daily until healed.  

## 2020-04-06 ENCOUNTER — Encounter: Payer: Self-pay | Admitting: Dermatology

## 2020-04-24 ENCOUNTER — Other Ambulatory Visit: Payer: Self-pay | Admitting: Internal Medicine

## 2020-04-24 DIAGNOSIS — M7989 Other specified soft tissue disorders: Secondary | ICD-10-CM

## 2020-04-26 ENCOUNTER — Ambulatory Visit
Admission: RE | Admit: 2020-04-26 | Discharge: 2020-04-26 | Disposition: A | Discharge status: Home / Self Care | Payer: Medicare Other | Source: Ambulatory Visit | Attending: Internal Medicine | Admitting: Internal Medicine

## 2020-04-26 ENCOUNTER — Other Ambulatory Visit: Payer: Self-pay

## 2020-04-26 DIAGNOSIS — M7989 Other specified soft tissue disorders: Secondary | ICD-10-CM | POA: Insufficient documentation

## 2020-06-07 ENCOUNTER — Other Ambulatory Visit: Payer: Self-pay | Admitting: Urology

## 2020-06-07 DIAGNOSIS — R3914 Feeling of incomplete bladder emptying: Secondary | ICD-10-CM

## 2020-06-07 DIAGNOSIS — N401 Enlarged prostate with lower urinary tract symptoms: Secondary | ICD-10-CM

## 2020-07-05 ENCOUNTER — Other Ambulatory Visit: Payer: Self-pay | Admitting: Urology

## 2020-07-05 DIAGNOSIS — N401 Enlarged prostate with lower urinary tract symptoms: Secondary | ICD-10-CM

## 2020-07-05 DIAGNOSIS — R3914 Feeling of incomplete bladder emptying: Secondary | ICD-10-CM

## 2020-07-06 ENCOUNTER — Other Ambulatory Visit: Payer: Self-pay | Admitting: Urology

## 2020-07-06 DIAGNOSIS — R3914 Feeling of incomplete bladder emptying: Secondary | ICD-10-CM

## 2020-07-06 DIAGNOSIS — N401 Enlarged prostate with lower urinary tract symptoms: Secondary | ICD-10-CM

## 2020-07-18 ENCOUNTER — Other Ambulatory Visit: Payer: Self-pay | Admitting: *Deleted

## 2020-07-18 ENCOUNTER — Other Ambulatory Visit: Payer: Self-pay | Admitting: Urology

## 2020-07-18 DIAGNOSIS — R3914 Feeling of incomplete bladder emptying: Secondary | ICD-10-CM

## 2020-07-18 DIAGNOSIS — N5201 Erectile dysfunction due to arterial insufficiency: Secondary | ICD-10-CM

## 2020-07-18 DIAGNOSIS — N401 Enlarged prostate with lower urinary tract symptoms: Secondary | ICD-10-CM

## 2020-07-18 MED ORDER — TAMSULOSIN HCL 0.4 MG PO CAPS: ORAL_CAPSULE | ORAL | 3 refills | Status: DC

## 2021-01-17 ENCOUNTER — Other Ambulatory Visit: Payer: Self-pay

## 2021-01-17 ENCOUNTER — Ambulatory Visit (INDEPENDENT_AMBULATORY_CARE_PROVIDER_SITE_OTHER): Payer: Medicare Other | Admitting: Dermatology

## 2021-01-17 ENCOUNTER — Ambulatory Visit: Payer: Medicare Other | Attending: Internal Medicine

## 2021-01-17 DIAGNOSIS — Z85828 Personal history of other malignant neoplasm of skin: Secondary | ICD-10-CM | POA: Diagnosis not present

## 2021-01-17 DIAGNOSIS — Z1283 Encounter for screening for malignant neoplasm of skin: Secondary | ICD-10-CM

## 2021-01-17 DIAGNOSIS — L820 Inflamed seborrheic keratosis: Secondary | ICD-10-CM | POA: Diagnosis not present

## 2021-01-17 DIAGNOSIS — D1800 Hemangioma unspecified site: Secondary | ICD-10-CM

## 2021-01-17 DIAGNOSIS — L814 Other melanin hyperpigmentation: Secondary | ICD-10-CM

## 2021-01-17 DIAGNOSIS — L578 Other skin changes due to chronic exposure to nonionizing radiation: Secondary | ICD-10-CM | POA: Diagnosis not present

## 2021-01-17 DIAGNOSIS — Z23 Encounter for immunization: Secondary | ICD-10-CM

## 2021-01-17 DIAGNOSIS — D229 Melanocytic nevi, unspecified: Secondary | ICD-10-CM

## 2021-01-17 DIAGNOSIS — L570 Actinic keratosis: Secondary | ICD-10-CM

## 2021-01-17 DIAGNOSIS — L821 Other seborrheic keratosis: Secondary | ICD-10-CM

## 2021-01-17 MED ORDER — PFIZER COVID-19 VAC BIVALENT 30 MCG/0.3ML IM SUSP
INTRAMUSCULAR | 0 refills | Status: DC
  Filled 2021-01-17: qty 0.3, 1d supply, fill #0

## 2021-01-17 NOTE — Progress Notes (Signed)
   Covid-19 Vaccination Clinic  Name:  George Bruce    MRN: [id number] DOB: Aug 23, 1945  01/17/2021  Mr. Riegler was observed post Covid-19 immunization for 15 minutes without incident. He was provided with Vaccine Information Sheet and instruction to access the V-Safe system.   Mr. Dault was instructed to call 911 with any severe reactions post vaccine: Difficulty breathing  Swelling of face and throat  A fast heartbeat  A bad rash all over body  Dizziness and weakness   Immunizations Administered     Name Date Dose VIS Date Route   Pfizer Covid-19 Vaccine Bivalent Booster 01/17/2021 10:24 AM 0.3 mL 11/07/2020 Intramuscular   Manufacturer: [place]   Lot: [id number]   [place]: [phone]

## 2021-01-17 NOTE — Patient Instructions (Addendum)
Actinic keratoses are precancerous spots that appear secondary to cumulative UV radiation exposure/sun exposure over time. They are chronic with expected duration over 1 year. A portion of actinic keratoses will progress to squamous cell carcinoma of the skin. It is not possible to reliably predict which spots will progress to skin cancer and so treatment is recommended to prevent development of skin cancer.  Recommend daily broad spectrum sunscreen SPF 30+ to sun-exposed areas, reapply every 2 hours as needed.  Recommend staying in the shade or wearing long sleeves, sun glasses (UVA+UVB protection) and wide brim hats (4-inch brim around the entire circumference of the hat). Call for new or changing lesions.   Cryotherapy Aftercare  Wash gently with soap and water everyday.   Apply Vaseline and Band-Aid daily until healed.     Melanoma ABCDEs  Melanoma is the most dangerous type of skin cancer, and is the leading cause of death from skin disease.  You are more likely to develop melanoma if you: Have light-colored skin, light-colored eyes, or red or blond hair Spend a lot of time in the sun Tan regularly, either outdoors or in a tanning bed Have had blistering sunburns, especially during childhood Have a close family member who has had a melanoma Have atypical moles or large birthmarks  Early detection of melanoma is key since treatment is typically straightforward and cure rates are extremely high if we catch it early.   The first sign of melanoma is often a change in a mole or a new dark spot.  The ABCDE system is a way of remembering the signs of melanoma.  A for asymmetry:  The two halves do not match. B for border:  The edges of the growth are irregular. C for color:  A mixture of colors are present instead of an even brown color. D for diameter:  Melanomas are usually (but not always) greater than 53mm - the size of a pencil eraser. E for evolution:  The spot keeps changing in  size, shape, and color.  Please check your skin once per month between visits. You can use a small mirror in front and a large mirror behind you to keep an eye on the back side or your body.   If you see any new or changing lesions before your next follow-up, please call to schedule a visit.  Please continue daily skin protection including broad spectrum sunscreen SPF 30+ to sun-exposed areas, reapplying every 2 hours as needed when you're outdoors.   Staying in the shade or wearing long sleeves, sun glasses (UVA+UVB protection) and wide brim hats (4-inch brim around the entire circumference of the hat) are also recommended for sun protection.    If you have any questions or concerns for your doctor, please call our main line at [phone] and press option 4 to reach your doctor's medical assistant. If no one answers, please leave a voicemail as directed and we will return your call as soon as possible. Messages left after 4 pm will be answered the following business day.   You may also send [place] a message via [place]. We typically respond to MyChart messages within 1-2 business days.  For prescription refills, please ask your pharmacy to contact our office. Our fax number is [phone].  If you have an urgent issue when the clinic is closed that cannot wait until the next business day, you can page your doctor at the number below.    Please note that while we do our best  to be available for urgent issues outside of office hours, we are not available 24/7.   If you have an urgent issue and are unable to reach [place], you may choose to seek medical care at your doctor's office, retail clinic, urgent care center, or emergency room.  If you have a medical emergency, please immediately call 911 or go to the emergency department.  Pager Numbers  - Dr. Nehemiah Massed: [phone]  - Dr. Laurence Ferrari: [phone]  - Dr. Nicole Kindred: [phone]  In the event of inclement weather, please call our main line at  [phone] for an update on the status of any delays or closures.  Dermatology Medication Tips: Please keep the boxes that topical medications come in in order to help keep track of the instructions about where and how to use these. Pharmacies typically print the medication instructions only on the boxes and not directly on the medication tubes.   If your medication is too expensive, please contact our office at [phone] option 4 or send [place] a message through [place].   We are unable to tell what your co-pay for medications will be in advance as this is different depending on your insurance coverage. However, we may be able to find a substitute medication at lower cost or fill out paperwork to get insurance to cover a needed medication.   If a prior authorization is required to get your medication covered by your insurance company, please allow [place] 1-2 business days to complete this process.  Drug prices often vary depending on where the prescription is filled and some pharmacies may offer cheaper prices.  The website www.goodrx.com contains coupons for medications through different pharmacies. The prices here do not account for what the cost may be with help from insurance (it may be cheaper with your insurance), but the website can give you the price if you did not use any insurance.  - You can print the associated coupon and take it with your prescription to the pharmacy.  - You may also stop by our office during regular business hours and pick up a GoodRx coupon card.  - If you need your prescription sent electronically to a different pharmacy, notify our office through [facility] or by phone at [phone] option 4.

## 2021-01-17 NOTE — Progress Notes (Signed)
Follow-Up Visit   Subjective  George Bruce is a 75 y.o. male who presents for the following: Annual Exam (Patient here today for tbse. Patient reports a spot at right forearm that hurts when he touches. Patient reports a spot right side of jaw that he would like checked. ).  Patient here for full body skin exam and skin cancer screening.  The following portions of the chart were reviewed this encounter and updated as appropriate:  Tobacco  Allergies  Meds  Problems  Med Hx  Surg Hx  Fam Hx     Review of Systems: No other skin or systemic complaints except as noted in HPI or Assessment and Plan.  Objective  Well appearing patient in no apparent distress; mood and affect are within normal limits.  A full examination was performed including scalp, head, eyes, ears, nose, lips, neck, chest, axillae, abdomen, back, buttocks, bilateral upper extremities, bilateral lower extremities, hands, feet, fingers, toes, fingernails, and toenails. All findings within normal limits unless otherwise noted below.  right mandible x 1, right forearm x 1 (2) Erythematous keratotic or waxy stuck-on papule or plaque.   face and ears x 15 (15) Erythematous thin papules/macules with gritty scale.   Assessment & Plan  Inflamed seborrheic keratosis right mandible x 1, right forearm x 1  Destruction of lesion - right mandible x 1, right forearm x 1 Complexity: simple   Destruction method: cryotherapy   Informed consent: discussed and consent obtained   Timeout:  patient name, date of birth, surgical site, and procedure verified Lesion destroyed using liquid nitrogen: Yes   Region frozen until ice ball extended beyond lesion: Yes   Outcome: patient tolerated procedure well with no complications   Post-procedure details: wound care instructions given   Additional details:  Prior to procedure, discussed risks of blister formation, small wound, skin dyspigmentation, or rare scar following cryotherapy.  Recommend Vaseline ointment to treated areas while healing.   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 They are chronic with expected duration over 1 year. A portion of actinic keratoses will progress to squamous cell carcinoma of the skin. It is not possible to reliably predict which spots will progress to skin cancer and so treatment is recommended to prevent development of skin cancer.  Recommend daily broad spectrum sunscreen SPF 30+ to sun-exposed areas, reapply every 2 hours as needed.  Recommend staying in the shade or wearing long sleeves, sun glasses (UVA+UVB protection) and wide brim hats (4-inch brim around the entire circumference of the hat). Call for new or changing lesions.  Destruction of lesion - face and ears x 15 Complexity: simple   Destruction method: cryotherapy   Informed consent: discussed and consent obtained   Timeout:  patient name, date of birth, surgical site, and procedure verified Lesion destroyed using liquid nitrogen: Yes   Region frozen until ice ball extended beyond lesion: Yes   Outcome: patient tolerated procedure well with no complications   Post-procedure details: wound care instructions given    Skin cancer screening  Lentigines - Scattered tan macules - Due to sun exposure - Benign-appearing, observe - Recommend daily broad spectrum sunscreen SPF 30+ to sun-exposed areas, reapply every 2 hours as needed. - Call for any changes  Seborrheic Keratoses - Stuck-on, waxy, tan-brown papules and/or plaques  - Benign-appearing - Discussed benign etiology and prognosis. - Observe - Call for any  changes  Melanocytic Nevi - Tan-brown and/or pink-flesh-colored symmetric macules and papules - Benign appearing on exam today - Observation - Call clinic for new or changing moles - Recommend daily use of broad spectrum spf 30+  sunscreen to sun-exposed areas.   Hemangiomas - Red papules - Discussed benign nature - Observe - Call for any changes  Actinic Damage - Chronic condition, secondary to cumulative UV/sun exposure - diffuse scaly erythematous macules with underlying dyspigmentation - Recommend daily broad spectrum sunscreen SPF 30+ to sun-exposed areas, reapply every 2 hours as needed.  - Staying in the shade or wearing long sleeves, sun glasses (UVA+UVB protection) and wide brim hats (4-inch brim around the entire circumference of the hat) are also recommended for sun protection.  - Call for new or changing lesions.  History of Basal Cell Carcinoma of the Skin - No evidence of recurrence today - Recommend regular full body skin exams - Recommend daily broad spectrum sunscreen SPF 30+ to sun-exposed areas, reapply every 2 hours as needed.  - Call if any new or changing lesions are noted between office visits  History of Squamous Cell Carcinoma of the Skin - No evidence of recurrence today - No lymphadenopathy - Recommend regular full body skin exams - Recommend daily broad spectrum sunscreen SPF 30+ to sun-exposed areas, reapply every 2 hours as needed.  - Call if any new or changing lesions are noted between office visits  Skin cancer screening performed today.  Return in about 1 year (around 01/17/2022) for tbse . IRuthell Rummage, CMA, am acting as scribe for Sarina Ser, MD. Documentation: I have reviewed the above documentation for accuracy and completeness, and I agree with the above.  Sarina Ser, MD

## 2021-01-22 ENCOUNTER — Encounter: Payer: Self-pay | Admitting: Dermatology

## 2021-01-23 IMAGING — CT CT ABD-PELV W/ CM
2 of 5 series · 16 of 46 positions shown, 18 images · IV contrast (APPLIED)
Comparison: Abdominal ultrasound dated 04/06/2018

CLINICAL DATA: 73-year-old male with flank pain. Concern for kidney
stone.

EXAM:
CT ABDOMEN AND PELVIS WITH CONTRAST
TECHNIQUE: Multidetector CT imaging of the abdomen and pelvis was performed
using the standard protocol following bolus administration of
intravenous contrast.
CONTRAST:  100mL OMNIPAQUE IOHEXOL 300 MG/ML  SOLN

[Series 2: routine abd/pel with · axial · 0.77mm/px · z∈[-978,-553]mm · 13 of 96 slices shown, 15 images]
[im 6/96  soft-tissue]
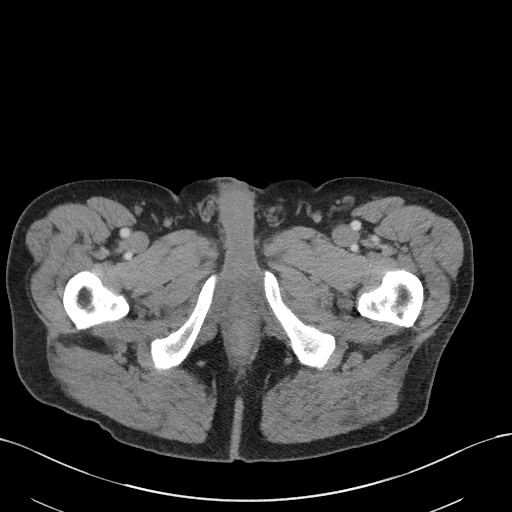
[im 6/96  bone]
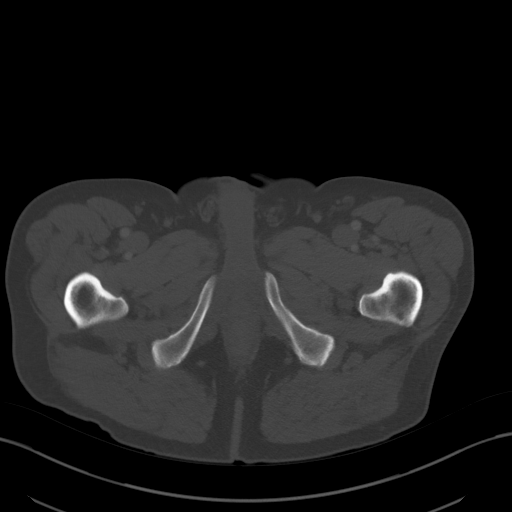
[im 16/96  soft-tissue]
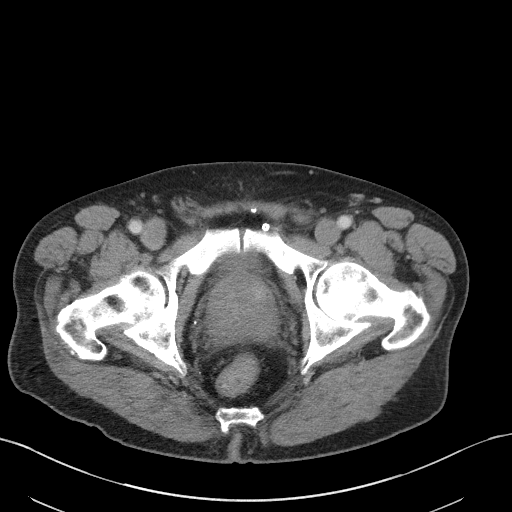
[im 21/96  soft-tissue]
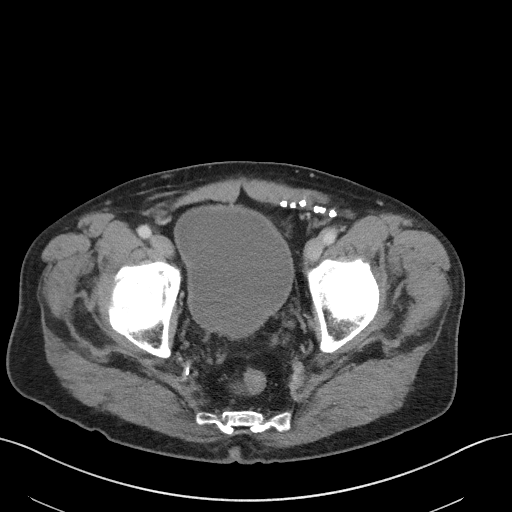
[im 26/96  soft-tissue]
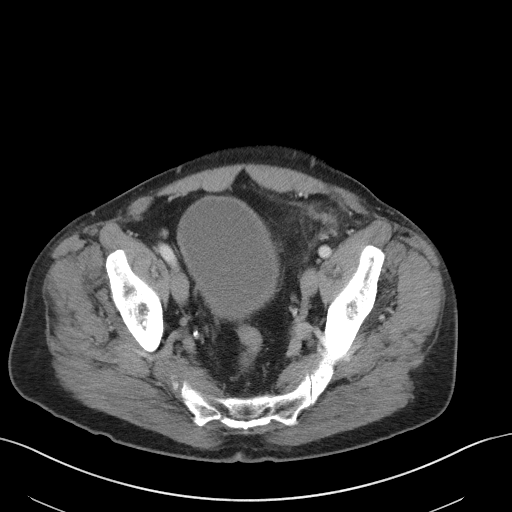
[im 36/96  soft-tissue]
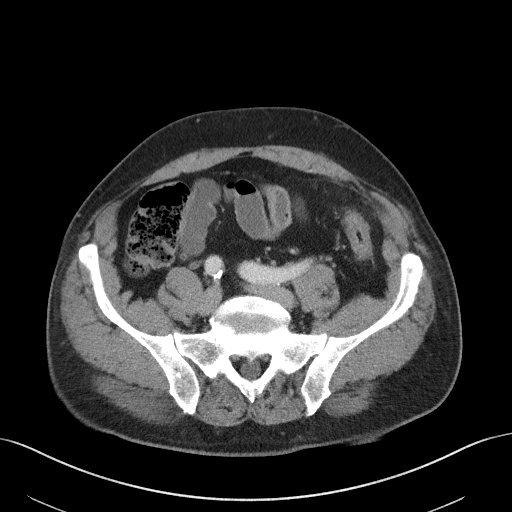
[im 41/96  soft-tissue]
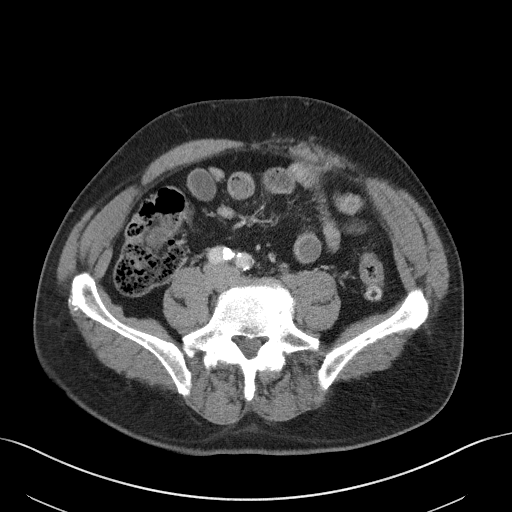
[im 51/96  soft-tissue]
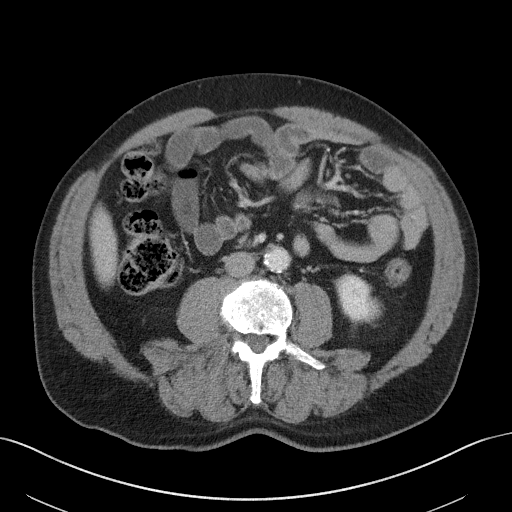
[im 56/96  soft-tissue]
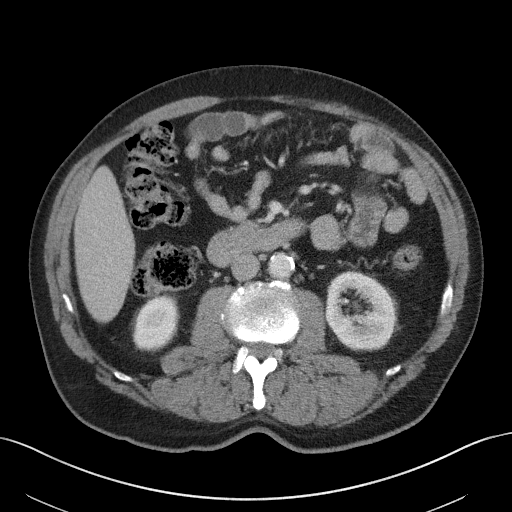
[im 61/96  soft-tissue]
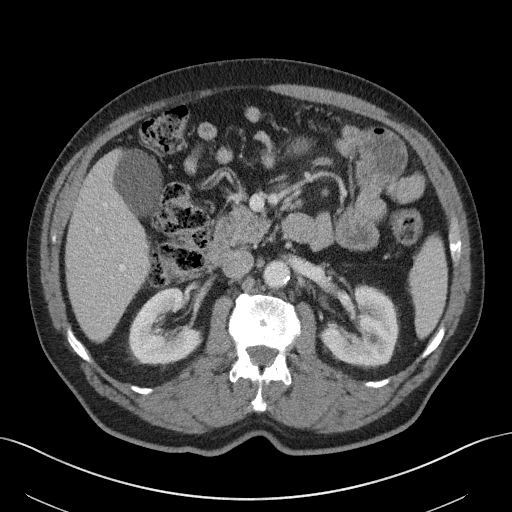
[im 61/96  bone]
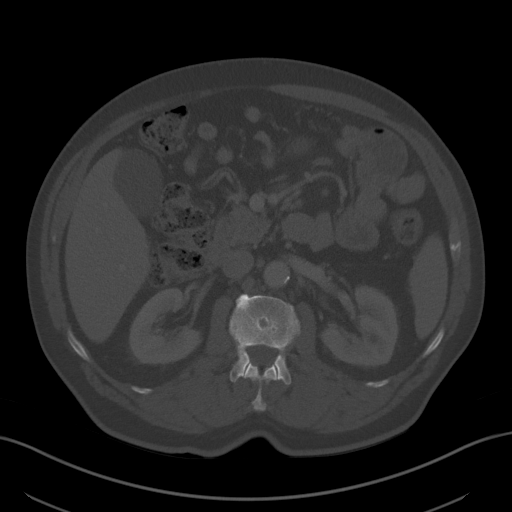
[im 71/96  soft-tissue]
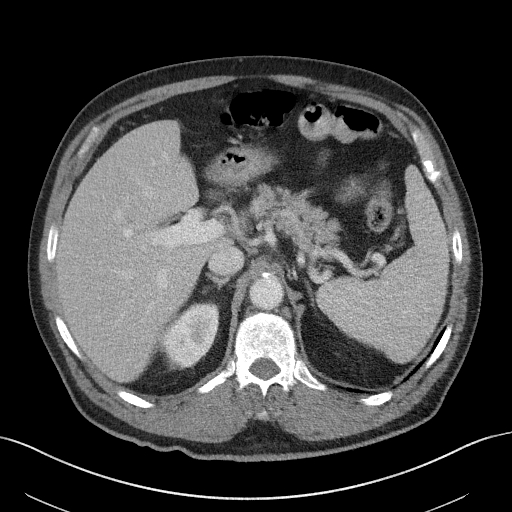
[im 76/96  soft-tissue]
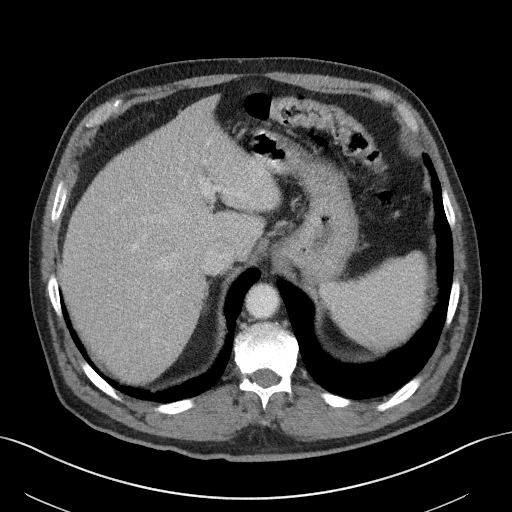
[im 81/96  soft-tissue]
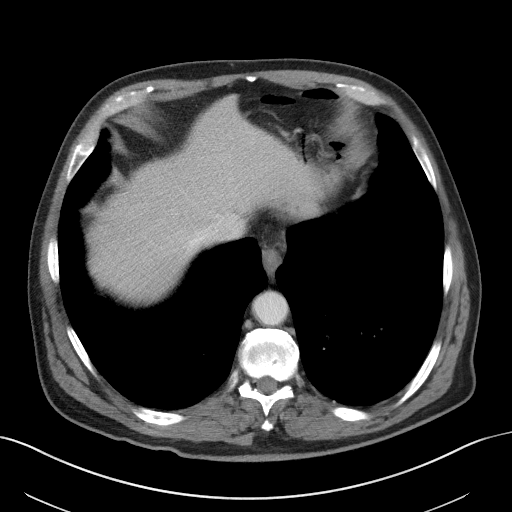
[im 91/96  soft-tissue]
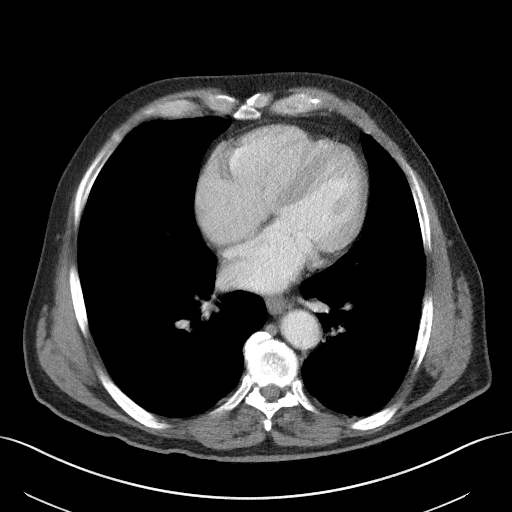

[Series 6: coronal st · coronal · 0.78mm/px · 3 of 98 slices shown]
[im 33/98  soft-tissue]
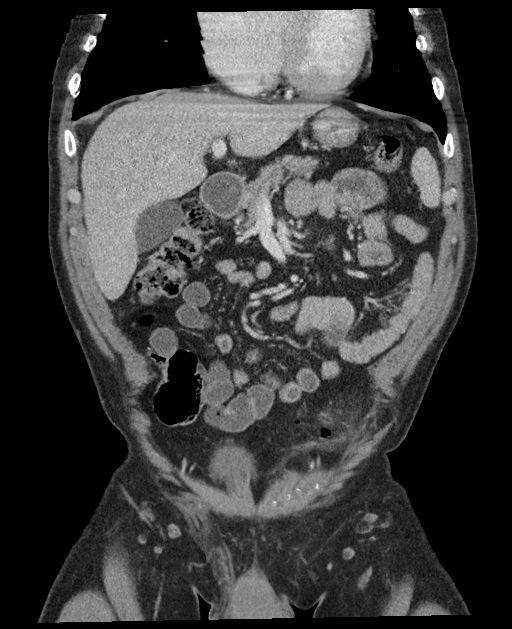
[im 44/98  soft-tissue]
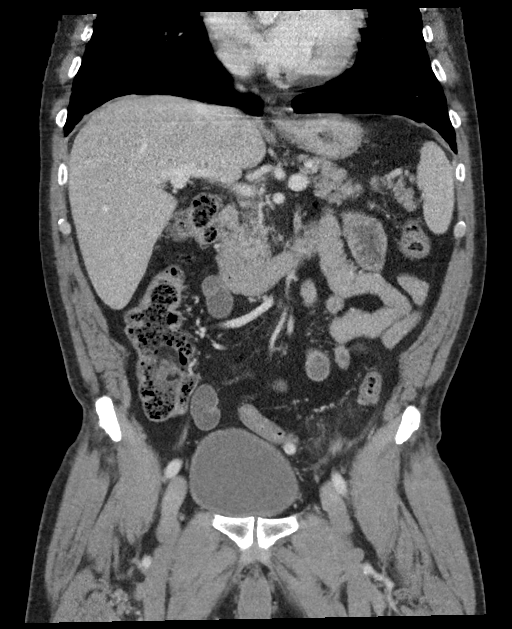
[im 54/98  soft-tissue]
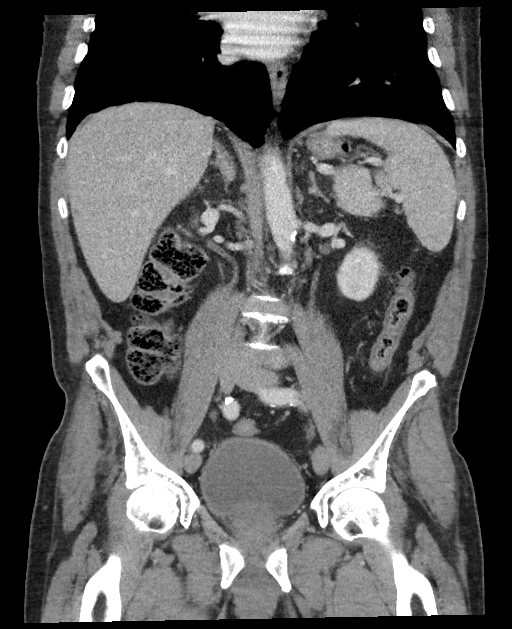

[16 of 46 positions shown; findings below may reference images not displayed]

FINDINGS: Lower chest: The visualized lung bases are clear.

There is a small pneumoperitoneum. No free fluid.

Hepatobiliary: A 1 cm hypodense lesion in the right lobe of the
liver is not characterized but may correspond to the cyst seen on
the prior ultrasound. The liver is otherwise unremarkable. No
intrahepatic biliary ductal dilatation. The gallbladder is
unremarkable.

Pancreas: Unremarkable. No pancreatic ductal dilatation or
surrounding inflammatory changes.

Spleen: Normal in size without focal abnormality.

Adrenals/Urinary Tract: The adrenal glands are unremarkable. There
is no hydronephrosis or nephrolithiasis on either side. There is
symmetric enhancement and excretion of contrast by both kidneys. The
visualized ureters and urinary bladder appear unremarkable.

Stomach/Bowel: There is sigmoid diverticulosis with active
inflammatory changes. Small pockets of extraluminal air adjacent to
the sigmoid colon consistent with perforation. A 1.8 x 1.2 cm fluid
noted adjacent to the sigmoid colon. No drainable fluid collection
or abscess. There is no bowel obstruction. The appendix is normal.

Vascular/Lymphatic: Moderate aortoiliac atherosclerotic disease. No
portal venous gas. There is no adenopathy.

Reproductive: Mildly enlarged prostate gland. The seminal vesicles
are symmetric.

Other: None

Musculoskeletal: Degenerative changes of the spine. No acute osseous
pathology. Prior left inguinal hernia repair.
IMPRESSION: 1. Perforated sigmoid diverticulitis. No drainable fluid collection
or abscess.
2. No hydronephrosis.
3.

These results were called by telephone at the time of interpretation
on 05/07/2018 at [DATE] to Dr. DER PIE , who verbally
acknowledged these results.

## 2021-03-07 ENCOUNTER — Ambulatory Visit: Payer: Self-pay | Admitting: Urology

## 2021-03-15 ENCOUNTER — Other Ambulatory Visit: Payer: Self-pay

## 2021-03-15 ENCOUNTER — Ambulatory Visit (INDEPENDENT_AMBULATORY_CARE_PROVIDER_SITE_OTHER): Payer: Medicare Other | Admitting: Urology

## 2021-03-15 ENCOUNTER — Encounter: Payer: Self-pay | Admitting: Urology

## 2021-03-15 VITALS — BP 157/94 | HR 67 | Ht 69.0 in | Wt 183.0 lb

## 2021-03-15 DIAGNOSIS — R3914 Feeling of incomplete bladder emptying: Secondary | ICD-10-CM

## 2021-03-15 DIAGNOSIS — N5201 Erectile dysfunction due to arterial insufficiency: Secondary | ICD-10-CM

## 2021-03-15 DIAGNOSIS — N401 Enlarged prostate with lower urinary tract symptoms: Secondary | ICD-10-CM

## 2021-03-15 LAB — BLADDER SCAN AMB NON-IMAGING: Scan Result: 196

## 2021-03-15 MED ORDER — FINASTERIDE 5 MG PO TABS: 5.0000 mg | ORAL_TABLET | Freq: Every day | ORAL | 3 refills | Status: DC

## 2021-03-15 MED ORDER — TADALAFIL 20 MG PO TABS: ORAL_TABLET | ORAL | 0 refills | Status: DC

## 2021-03-15 NOTE — Progress Notes (Signed)
03/15/2021 12:26 PM   George Bruce [birth year] [id number]  Referring provider: Idelle Crouch, MD [place] [facility] [place],  [place] [address]  Chief Complaint  Patient presents with   Benign Prostatic Hypertrophy    Urologic history: 1.  Elevated PSA -Prostate biopsy 03/2016; PSA 4.1; prostate volume 73 cc; benign pathology -MRI prostate 06/2016 PSA 6.16; no suspicious lesions   2.  BPH with lower urinary tract symptoms -Finasteride/tamsulosin -Incomplete emptying; PVR ~200 mL   3.  Erectile dysfunction -Generic sildenafil   HPI: 76 y.o. male presents for annual follow-up.  Doing well since last visit Stable LUTS on tamsulosin/finasteride Denies dysuria, gross hematuria Denies flank, abdominal or pelvic pain Tadalafil was partially effective.  Not interested in intracavernosal injections or vacuum erection devices   PMH: 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    Surgical History: Past Surgical History:  Procedure Laterality Date   COLONOSCOPY WITH PROPOFOL N/A 04/21/2018   Procedure: COLONOSCOPY WITH PROPOFOL;  Surgeon: Virgel Manifold, MD;  Location: [facility] ENDOSCOPY;  Service: Endoscopy;  Laterality: N/A;   INGUINAL HERNIA REPAIR Left 1990's   INGUINAL HERNIA REPAIR Right 2015   REVERSE SHOULDER ARTHROPLASTY Left 11/06/2015   Procedure: REVERSE SHOULDER ARTHROPLASTY;  Surgeon: Corky Mull, MD;  Location: [facility] ORS;  Service: Orthopedics;  Laterality: Left;    Home Medications:  Allergies as of 03/15/2021   No Known Allergies      Medication List        Accurate as of March 15, 2021 12:26 PM. If you have any questions, ask your nurse or doctor.          Black Pepper-Turmeric 3-500 MG  Caps Take by mouth.   finasteride 5 MG tablet Commonly known as: PROSCAR TAKE 1 TABLET BY MOUTH EVERY DAY   ibuprofen 600 MG tablet Commonly known as: ADVIL Take 1 tablet (600 mg total) by mouth every 6 (six) hours as needed.   multivitamin with minerals Tabs tablet Take 1 tablet by mouth daily.   omega-3 acid ethyl esters 1 g capsule Commonly known as: LOVAZA Take 1 g by mouth daily.   oxyCODONE 5 MG immediate release tablet Commonly known as: Oxy IR/ROXICODONE Take 1 tablet (5 mg total) by mouth every 6 (six) hours as needed for severe pain.   Pfizer COVID-19 Vac Bivalent injection Generic drug: COVID-19 mRNA bivalent vaccine Therapist, music) Inject into the muscle.   simvastatin 40 MG tablet Commonly known as: ZOCOR Take 40 mg by mouth daily at 6 PM.   tadalafil 20 MG tablet Commonly known as: CIALIS 0.5-1 tab 1 hour prior to intercourse   tamsulosin 0.4 MG Caps capsule Commonly known as: FLOMAX As directed        Allergies: No Known Allergies  Family History: Family History  Problem Relation Age of Onset   Diabetes Mother     Social History:  reports that he quit smoking about 26 years ago. His smoking use included cigarettes. He has never used smokeless tobacco. He reports current alcohol use of about 2.0 - 3.0 standard drinks per week. He reports that he does not use drugs.   Physical Exam: BP (!) 157/94  Pulse 67    Ht 5\' 9"  (1.753 m)    Wt 183 lb (83 kg)    BMI 27.02 kg/m   Constitutional:  Alert and oriented, No acute distress. HEENT: [place] AT, moist mucus membranes.  Trachea midline, no masses. Cardiovascular: No clubbing, cyanosis, or edema. Respiratory: Normal respiratory effort, no increased work of breathing. Psychiatric: Normal mood and affect.   Assessment & Plan:    1. Benign prostatic hyperplasia with incomplete bladder emptying Stable Tamsulosin/finasteride refilled Bladder scan PVR stable at 296 Continue annual follow-up  2.  Erectile  dysfunction Stable Requested tadalafil refill   Abbie Sons, MD  [facility] [address] [place], [place] [address] [phone]

## 2021-07-30 ENCOUNTER — Other Ambulatory Visit: Payer: Self-pay | Admitting: Urology

## 2021-07-30 DIAGNOSIS — N401 Enlarged prostate with lower urinary tract symptoms: Secondary | ICD-10-CM

## 2021-10-09 ENCOUNTER — Encounter: Payer: Self-pay | Admitting: Emergency Medicine

## 2021-10-09 ENCOUNTER — Emergency Department: Payer: Medicare Other

## 2021-10-09 ENCOUNTER — Emergency Department
Admission: EM | Admit: 2021-10-09 | Discharge: 2021-10-09 | Disposition: A | Discharge status: Home / Self Care | Payer: Medicare Other | Attending: Emergency Medicine | Admitting: Emergency Medicine

## 2021-10-09 DIAGNOSIS — M25551 Pain in right hip: Secondary | ICD-10-CM | POA: Diagnosis present

## 2021-10-09 DIAGNOSIS — M545 Low back pain, unspecified: Secondary | ICD-10-CM | POA: Diagnosis not present

## 2021-10-09 DIAGNOSIS — X58XXXA Exposure to other specified factors, initial encounter: Secondary | ICD-10-CM | POA: Insufficient documentation

## 2021-10-09 DIAGNOSIS — S76011A Strain of muscle, fascia and tendon of right hip, initial encounter: Secondary | ICD-10-CM | POA: Insufficient documentation

## 2021-10-09 LAB — CBC WITH DIFFERENTIAL/PLATELET
Abs Immature Granulocytes: 0.01 10*3/uL (ref 0.00–0.07)
Basophils Absolute: 0 10*3/uL (ref 0.0–0.1)
Basophils Relative: 0 %
Eosinophils Absolute: 0.2 10*3/uL (ref 0.0–0.5)
Eosinophils Relative: 2 %
HCT: 45.3 % (ref 39.0–52.0)
Hemoglobin: 14.9 g/dL (ref 13.0–17.0)
Immature Granulocytes: 0 %
Lymphocytes Relative: 20 %
Lymphs Abs: 1.3 10*3/uL (ref 0.7–4.0)
MCH: 29.7 pg (ref 26.0–34.0)
MCHC: 32.9 g/dL (ref 30.0–36.0)
MCV: 90.4 fL (ref 80.0–100.0)
Monocytes Absolute: 0.5 10*3/uL (ref 0.1–1.0)
Monocytes Relative: 8 %
Neutro Abs: 4.6 10*3/uL (ref 1.7–7.7)
Neutrophils Relative %: 70 %
Platelets: 164 10*3/uL (ref 150–400)
RBC: 5.01 MIL/uL (ref 4.22–5.81)
RDW: 13.1 % (ref 11.5–15.5)
WBC: 6.6 10*3/uL (ref 4.0–10.5)
nRBC: 0 % (ref 0.0–0.2)

## 2021-10-09 LAB — BASIC METABOLIC PANEL
Anion gap: 5 (ref 5–15)
BUN: 20 mg/dL (ref 8–23)
CO2: 28 mmol/L (ref 22–32)
Calcium: 9 mg/dL (ref 8.9–10.3)
Chloride: 110 mmol/L (ref 98–111)
Creatinine, Ser: 1.07 mg/dL (ref 0.61–1.24)
GFR, Estimated: >60 mL/min (ref >60)
Glucose, Bld: 112 mg/dL — ABNORMAL HIGH (ref 70–99)
Potassium: 4.2 mmol/L (ref 3.5–5.1)
Sodium: 143 mmol/L (ref 135–145)

## 2021-10-09 LAB — URINALYSIS, ROUTINE W REFLEX MICROSCOPIC
Bilirubin Urine: NEGATIVE
Glucose, UA: NEGATIVE mg/dL
Hgb urine dipstick: NEGATIVE
Ketones, ur: NEGATIVE mg/dL
Leukocytes,Ua: NEGATIVE
Nitrite: NEGATIVE
Protein, ur: NEGATIVE mg/dL
Specific Gravity, Urine: 1.013 (ref 1.005–1.030)
pH: 5 (ref 5.0–8.0)

## 2021-10-09 MED ORDER — LIDOCAINE 5 % EX PTCH
1.0000 | MEDICATED_PATCH | Freq: Two times a day (BID) | CUTANEOUS | 0 refills | Status: DC
Start: 2021-10-09 — End: 2022-03-13

## 2021-10-09 MED ORDER — LIDOCAINE 5 % EX PTCH: 1.0000 | MEDICATED_PATCH | Freq: Two times a day (BID) | CUTANEOUS | 0 refills | Status: DC

## 2021-10-09 NOTE — ED Triage Notes (Signed)
Patient arrives ambulatory by POV c/o right sided flank pain/ lower back pain since last week. Pain increased today and has been unbearable. States when he wakes up in the morning has a strong urge to void.

## 2021-10-09 NOTE — ED Provider Triage Note (Signed)
Emergency Medicine Provider Triage Evaluation Note  George Bruce , a 76 y.o. male  was evaluated in triage.  Pt complains of right-sided flank pain.  Review of Systems  Positive: Flank pain Negative: Fever  Physical Exam  BP (!) 143/85 (BP Location: Left Arm)   Pulse 71   Temp 98 F (36.7 C) (Oral)   Resp 18   Ht 5' 9.5" (1.765 m)   Wt 82.6 kg   SpO2 96%   BMI 26.49 kg/m  Gen:   Awake, no distress   Resp:  Normal effort  MSK:   Moves extremities without difficulty  Other:    Medical Decision Making  Medically screening exam initiated at 2:53 PM.  Appropriate orders placed.  George Bruce was informed that the remainder of the evaluation will be completed by another provider, this initial triage assessment does not replace that evaluation, and the importance of remaining in the ED until their evaluation is complete.  Right upper quadrant and right lower quadrant are nontender.  Patient has more flank pain type symptoms.  CT renal stone to evaluate for kidney stone   [name], PA-C 10/09/21 1453

## 2021-10-09 NOTE — ED Provider Notes (Signed)
[facility] Provider Note    Event Date/Time   First MD Initiated Contact with Patient 10/09/21 1707     (approximate)   History   Chief Complaint: Flank Pain   HPI  George Bruce is a 76 y.o. male with a past history of diverticulitis, inguinal hernia, enlarged prostate who comes the ED complaining of right hip pain and right lower back pain for the past 10 days.  Worse with movement.  Waxing and waning.  No vomiting diarrhea or constipation.  No fever.  He does feel like he has some increased pressure with urinating but no frank dysuria or hematuria.  Normal oral intake, not affected by eating.  Denies any trauma.  Earlier today he played golf and afterward his pain was more severe.  No lower extremity weakness or paresthesia.  Pain is nonradiating.     Physical Exam   Triage Vital Signs: ED Triage Vitals  Enc Vitals Group     BP 10/09/21 1448 (!) 143/85     Pulse Rate 10/09/21 1448 71     Resp 10/09/21 1448 18     Temp 10/09/21 1448 98 F (36.7 C)     Temp Source 10/09/21 1448 Oral     SpO2 10/09/21 1448 96 %     Weight 10/09/21 1449 182 lb (82.6 kg)     Height 10/09/21 1449 5' 9.5" (1.765 m)     Head Circumference --      Peak Flow --      Pain Score 10/09/21 1448 8     Pain Loc --      Pain Edu? --      Excl. in [place]? --     Most recent vital signs: Vitals:   10/09/21 1448  BP: (!) 143/85  Pulse: 71  Resp: 18  Temp: 98 F (36.7 C)  SpO2: 96%    General: Awake, no distress.  CV:  Good peripheral perfusion.  Resp:  Normal effort.  Abd:  No distention.  Soft and nontender Other:  No midline spinal tenderness.  No lumbar spine muscular tenderness.  Intact range of motion, no bony tenderness or soft tissue inflammatory changes.  No rash.  Patient does have pain reproduced in the lateral right hip with flexion of the hip.  Additionally, flexion against resistance reproduces his pain at the anterior aspect of the hip.   ED  Results / Procedures / Treatments   Labs (all labs ordered are listed, but only abnormal results are displayed) Labs Reviewed  BASIC METABOLIC PANEL - Abnormal; Notable for the following components:      Result Value   Glucose, Bld 112 (*)    All other components within normal limits  URINALYSIS, ROUTINE W REFLEX MICROSCOPIC - Abnormal; Notable for the following components:   Color, Urine YELLOW (*)    APPearance CLEAR (*)    All other components within normal limits  CBC WITH DIFFERENTIAL/PLATELET     EKG    RADIOLOGY CT abdomen pelvis interpreted by me, negative for bowel obstruction, hernia, free air.  Radiology report reviewed, no acute findings.   PROCEDURES:  Procedures   MEDICATIONS ORDERED IN ED: Medications - No data to display   IMPRESSION / MDM / [place] / ED COURSE  I reviewed the triage vital signs and the nursing notes.  Differential diagnosis includes, but is not limited to, ureterolithiasis, appendicitis, diverticulitis, hernia, muscle strain.  Doubt dissection, aortic aneurysm, mesenteric ischemia  Patient's presentation is most consistent with acute presentation with potential threat to life or bodily function.  Patient presents with right hip pain for the past 10 days, increased after playing golf today.  Exam is consistent with a psoas muscle strain.  Labs, urinalysis, CT scan are all negative.  Vital signs are normal and the patient is nontoxic and overall pain is controlled.  He will continue taking Tylenol and Aleve as per his usual, will add on range of motion exercises and heat therapy and lidocaine patch, follow-up with orthopedics.       FINAL CLINICAL IMPRESSION(S) / ED DIAGNOSES   Final diagnoses:  Right hip pain  Psoas muscle strain, right, initial encounter     Rx / DC Orders   ED Discharge Orders          Ordered    lidocaine (LIDODERM) 5 %  Every 12 hours        10/09/21 1732              Note:  This document was prepared using Dragon voice recognition software and may include unintentional dictation errors.   Carrie Mew, MD 10/09/21 1736

## 2022-01-15 ENCOUNTER — Other Ambulatory Visit: Payer: Self-pay | Admitting: Surgery

## 2022-01-15 DIAGNOSIS — M75121 Complete rotator cuff tear or rupture of right shoulder, not specified as traumatic: Secondary | ICD-10-CM

## 2022-01-15 DIAGNOSIS — M7581 Other shoulder lesions, right shoulder: Secondary | ICD-10-CM

## 2022-01-15 DIAGNOSIS — M12811 Other specific arthropathies, not elsewhere classified, right shoulder: Secondary | ICD-10-CM

## 2022-01-22 ENCOUNTER — Ambulatory Visit
Admission: RE | Admit: 2022-01-22 | Discharge: 2022-01-22 | Disposition: A | Discharge status: Home / Self Care | Payer: Medicare Other | Source: Ambulatory Visit | Attending: Surgery | Admitting: Surgery

## 2022-01-22 DIAGNOSIS — M75121 Complete rotator cuff tear or rupture of right shoulder, not specified as traumatic: Secondary | ICD-10-CM | POA: Diagnosis present

## 2022-01-22 DIAGNOSIS — M12811 Other specific arthropathies, not elsewhere classified, right shoulder: Secondary | ICD-10-CM | POA: Diagnosis present

## 2022-01-22 DIAGNOSIS — M7581 Other shoulder lesions, right shoulder: Secondary | ICD-10-CM | POA: Diagnosis not present

## 2022-01-23 ENCOUNTER — Ambulatory Visit (INDEPENDENT_AMBULATORY_CARE_PROVIDER_SITE_OTHER): Payer: Medicare Other | Admitting: Dermatology

## 2022-01-23 DIAGNOSIS — L578 Other skin changes due to chronic exposure to nonionizing radiation: Secondary | ICD-10-CM | POA: Diagnosis not present

## 2022-01-23 DIAGNOSIS — L814 Other melanin hyperpigmentation: Secondary | ICD-10-CM | POA: Diagnosis not present

## 2022-01-23 DIAGNOSIS — Z1283 Encounter for screening for malignant neoplasm of skin: Secondary | ICD-10-CM | POA: Diagnosis not present

## 2022-01-23 DIAGNOSIS — L570 Actinic keratosis: Secondary | ICD-10-CM

## 2022-01-23 DIAGNOSIS — Z85828 Personal history of other malignant neoplasm of skin: Secondary | ICD-10-CM

## 2022-01-23 DIAGNOSIS — D229 Melanocytic nevi, unspecified: Secondary | ICD-10-CM

## 2022-01-23 DIAGNOSIS — L821 Other seborrheic keratosis: Secondary | ICD-10-CM

## 2022-01-23 DIAGNOSIS — Z8589 Personal history of malignant neoplasm of other organs and systems: Secondary | ICD-10-CM

## 2022-01-23 DIAGNOSIS — D692 Other nonthrombocytopenic purpura: Secondary | ICD-10-CM

## 2022-01-23 NOTE — Patient Instructions (Addendum)
Cryotherapy Aftercare  Wash gently with soap and water everyday.   Apply Vaseline and Band-Aid daily until healed.     Due to recent changes in healthcare laws, you may see results of your pathology and/or laboratory studies on MyChart before the doctors have had a chance to review them. We understand that in some cases there may be results that are confusing or concerning to you. Please understand that not all results are received at the same time and often the doctors may need to interpret multiple results in order to provide you with the best plan of care or course of treatment. Therefore, we ask that you please give us 2 business days to thoroughly review all your results before contacting the office for clarification. Should we see a critical lab result, you will be contacted sooner.   If You Need Anything After Your Visit  If you have any questions or concerns for your doctor, please call our main line at [phone] and press option 4 to reach your doctor's medical assistant. If no one answers, please leave a voicemail as directed and we will return your call as soon as possible. Messages left after 4 pm will be answered the following business day.   You may also send us a message via MyChart. We typically respond to MyChart messages within 1-2 business days.  For prescription refills, please ask your pharmacy to contact our office. Our fax number is [phone].  If you have an urgent issue when the clinic is closed that cannot wait until the next business day, you can page your doctor at the number below.    Please note that while we do our best to be available for urgent issues outside of office hours, we are not available 24/7.   If you have an urgent issue and are unable to reach us, you may choose to seek medical care at your doctor's office, retail clinic, urgent care center, or emergency room.  If you have a medical emergency, please immediately call 911 or go to the  emergency department.  Pager Numbers  - Dr. Kowalski: [phone]  - Dr. Moye: [phone]  - Dr. Stewart: [phone]  In the event of inclement weather, please call our main line at [phone] for an update on the status of any delays or closures.  Dermatology Medication Tips: Please keep the boxes that topical medications come in in order to help keep track of the instructions about where and how to use these. Pharmacies typically print the medication instructions only on the boxes and not directly on the medication tubes.   If your medication is too expensive, please contact our office at [phone] option 4 or send us a message through MyChart.   We are unable to tell what your co-pay for medications will be in advance as this is different depending on your insurance coverage. However, we may be able to find a substitute medication at lower cost or fill out paperwork to get insurance to cover a needed medication.   If a prior authorization is required to get your medication covered by your insurance company, please allow us 1-2 business days to complete this process.  Drug prices often vary depending on where the prescription is filled and some pharmacies may offer cheaper prices.  The website www.goodrx.com contains coupons for medications through different pharmacies. The prices here do not account for what the cost may be with help from insurance (it may be cheaper with your insurance), but the website can   give you the price if you did not use any insurance.  - You can print the associated coupon and take it with your prescription to the pharmacy.  - You may also stop by our office during regular business hours and pick up a GoodRx coupon card.  - If you need your prescription sent electronically to a different pharmacy, notify our office through [facility] MyChart or by phone at [phone] option 4.     Si Usted Necesita Algo Despus de Su Visita  Tambin puede  enviarnos un mensaje a travs de MyChart. Por lo general respondemos a los mensajes de MyChart en el transcurso de 1 a 2 das hbiles.  Para renovar recetas, por favor pida a su farmacia que se ponga en contacto con nuestra oficina. Nuestro nmero de fax es el [phone].  Si tiene un asunto urgente cuando la clnica est cerrada y que no puede esperar hasta el siguiente da hbil, puede llamar/localizar a su doctor(a) al nmero que aparece a continuacin.   Por favor, tenga en cuenta que aunque hacemos todo lo posible para estar disponibles para asuntos urgentes fuera del horario de oficina, no estamos disponibles las 24 horas del da, los 7 das de la semana.   Si tiene un problema urgente y no puede comunicarse con nosotros, puede optar por buscar atencin mdica  en el consultorio de su doctor(a), en una clnica privada, en un centro de atencin urgente o en una sala de emergencias.  Si tiene una emergencia mdica, por favor llame inmediatamente al 911 o vaya a la sala de emergencias.  Nmeros de bper  - Dr. Kowalski: [phone]  - Dra. Moye: [phone]  - Dra. Stewart: [phone]  En caso de inclemencias del tiempo, por favor llame a nuestra lnea principal al [phone] para una actualizacin sobre el estado de cualquier retraso o cierre.  Consejos para la medicacin en dermatologa: Por favor, guarde las cajas en las que vienen los medicamentos de uso tpico para ayudarle a seguir las instrucciones sobre dnde y cmo usarlos. Las farmacias generalmente imprimen las instrucciones del medicamento slo en las cajas y no directamente en los tubos del medicamento.   Si su medicamento es muy caro, por favor, pngase en contacto con nuestra oficina llamando al [phone] y presione la opcin 4 o envenos un mensaje a travs de MyChart.   No podemos decirle cul ser su copago por los medicamentos por adelantado ya que esto es diferente dependiendo de la cobertura de su seguro.  Sin embargo, es posible que podamos encontrar un medicamento sustituto a menor costo o llenar un formulario para que el seguro cubra el medicamento que se considera necesario.   Si se requiere una autorizacin previa para que su compaa de seguros cubra su medicamento, por favor permtanos de 1 a 2 das hbiles para completar este proceso.  Los precios de los medicamentos varan con frecuencia dependiendo del lugar de dnde se surte la receta y alguna farmacias pueden ofrecer precios ms baratos.  El sitio web www.goodrx.com tiene cupones para medicamentos de diferentes farmacias. Los precios aqu no tienen en cuenta lo que podra costar con la ayuda del seguro (puede ser ms barato con su seguro), pero el sitio web puede darle el precio si no utiliz ningn seguro.  - Puede imprimir el cupn correspondiente y llevarlo con su receta a la farmacia.  - Tambin puede pasar por nuestra oficina durante el horario de atencin regular y recoger una tarjeta de cupones de GoodRx.  -   Si necesita que su receta se enve electrnicamente a una farmacia diferente, informe a nuestra oficina a travs de MyChart de [facility] o por telfono llamando al [phone] y presione la opcin 4.  

## 2022-01-23 NOTE — Progress Notes (Signed)
Follow-Up Visit   Subjective  George Bruce is a 76 y.o. male who presents for the following: Annual Exam. Hx of BCC, Hx of SCC The patient presents for Total-Body Skin Exam (TBSE) for skin cancer screening and mole check.  The patient has spots, moles and lesions to be evaluated, some may be new or changing and the patient has concerns that these could be cancer.   The following portions of the chart were reviewed this encounter and updated as appropriate:   Tobacco  Allergies  Meds  Problems  Med Hx  Surg Hx  Fam Hx     Review of Systems:  No other skin or systemic complaints except as noted in HPI or Assessment and Plan.  Objective  Well appearing patient in no apparent distress; mood and affect are within normal limits.  A full examination was performed including scalp, head, eyes, ears, nose, lips, neck, chest, axillae, abdomen, back, buttocks, bilateral upper extremities, bilateral lower extremities, hands, feet, fingers, toes, fingernails, and toenails. All findings within normal limits unless otherwise noted below.  left ear x 2, right ear x 1, nose x 1  (4) (4) Erythematous thin papules/macules with gritty scale.    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 It is chronic with expected duration over 1 year. Actinic cheilitis can progress to squamous cell carcinoma of the lip. It is not possible to reliably predict which spots will progress to skin cancer and so treatment is recommended to prevent development of skin cancer.  Recommend lip balm with broad spectrum sunscreen SPF 30+ to lips, reapply every 2 hours as needed.  Call for new or changing lesions.   Destruction of lesion - left ear x 2, right ear x 1, nose x 1  (4) Complexity: simple   Destruction method: cryotherapy    Informed consent: discussed and consent obtained   Timeout:  patient name, date of birth, surgical site, and procedure verified Lesion destroyed using liquid nitrogen: Yes   Region frozen until ice ball extended beyond lesion: Yes   Outcome: patient tolerated procedure well with no complications   Post-procedure details: wound care instructions given    Lentigines - Scattered tan macules - Due to sun exposure - Benign-appearing, observe - Recommend daily broad spectrum sunscreen SPF 30+ to sun-exposed areas, reapply every 2 hours as needed. - Call for any changes  Seborrheic Keratoses - Stuck-on, waxy, tan-brown papules and/or plaques  - Benign-appearing - Discussed benign etiology and prognosis. - Observe - Call for any changes  Melanocytic Nevi - Tan-brown and/or pink-flesh-colored symmetric macules and papules - Benign appearing on exam today - Observation - Call clinic for new or changing moles - Recommend daily use of broad spectrum spf 30+ sunscreen to sun-exposed areas.   Hemangiomas - Red papules - Discussed benign nature - Observe - Call for any changes  Actinic Damage - Chronic condition, secondary to cumulative UV/sun exposure - diffuse scaly erythematous macules with underlying dyspigmentation - Recommend daily broad spectrum sunscreen SPF 30+ to sun-exposed areas, reapply every 2 hours as needed.  - Staying in the shade or wearing long sleeves, sun glasses (UVA+UVB protection) and wide brim hats (4-inch brim around the entire circumference of the hat) are also recommended for sun protection.  - Call for new or  changing lesions.  Purpura - Chronic; persistent and recurrent.  Treatable, but not curable. - Violaceous macules and patches - Benign - Related to trauma, age, sun damage and/or use of blood thinners, chronic use of topical and/or oral steroids - Observe - Can use OTC arnica containing moisturizer such as Dermend Bruise Formula if desired - Call  for worsening or other concerns   History of Basal Cell Carcinoma of the Skin - No evidence of recurrence today - Recommend regular full body skin exams - Recommend daily broad spectrum sunscreen SPF 30+ to sun-exposed areas, reapply every 2 hours as needed.  - Call if any new or changing lesions are noted between office visits   History of Squamous Cell Carcinoma of the Skin - No evidence of recurrence today - No lymphadenopathy - Recommend regular full body skin exams - Recommend daily broad spectrum sunscreen SPF 30+ to sun-exposed areas, reapply every 2 hours as needed.  - Call if any new or changing lesions are noted between office visits  Skin cancer screening performed today.   Return in about 1 year (around 01/24/2023) for TBSE, hx of BCC, hx of SCC.  IMarye Round, CMA, am acting as scribe for Sarina Ser, MD .  Documentation: I have reviewed the above documentation for accuracy and completeness, and I agree with the above.  Sarina Ser, MD

## 2022-02-11 ENCOUNTER — Encounter: Payer: Self-pay | Admitting: Dermatology

## 2022-02-25 ENCOUNTER — Other Ambulatory Visit: Payer: Self-pay | Admitting: Surgery

## 2022-03-04 ENCOUNTER — Other Ambulatory Visit: Payer: Self-pay | Admitting: Urology

## 2022-03-04 DIAGNOSIS — N401 Enlarged prostate with lower urinary tract symptoms: Secondary | ICD-10-CM

## 2022-03-13 ENCOUNTER — Other Ambulatory Visit: Payer: Medicare Other

## 2022-03-13 ENCOUNTER — Encounter
Admission: RE | Admit: 2022-03-13 | Discharge: 2022-03-13 | Disposition: A | Discharge status: Home / Self Care | Payer: Medicare Other | Source: Ambulatory Visit | Attending: Surgery | Admitting: Surgery

## 2022-03-13 ENCOUNTER — Other Ambulatory Visit: Payer: Self-pay

## 2022-03-13 VITALS — BP 138/77 | HR 69 | Resp 16 | Wt 181.0 lb

## 2022-03-13 DIAGNOSIS — Z01818 Encounter for other preprocedural examination: Secondary | ICD-10-CM | POA: Diagnosis present

## 2022-03-13 DIAGNOSIS — Z01812 Encounter for preprocedural laboratory examination: Secondary | ICD-10-CM

## 2022-03-13 HISTORY — DX: Essential (primary) hypertension: I10

## 2022-03-13 LAB — CBC WITH DIFFERENTIAL/PLATELET
Abs Immature Granulocytes: 0.01 10*3/uL (ref 0.00–0.07)
Basophils Absolute: 0 10*3/uL (ref 0.0–0.1)
Basophils Relative: 1 %
Eosinophils Absolute: 0.2 10*3/uL (ref 0.0–0.5)
Eosinophils Relative: 4 %
HCT: 43.7 % (ref 39.0–52.0)
Hemoglobin: 14.6 g/dL (ref 13.0–17.0)
Immature Granulocytes: 0 %
Lymphocytes Relative: 25 %
Lymphs Abs: 1.5 10*3/uL (ref 0.7–4.0)
MCH: 29.4 pg (ref 26.0–34.0)
MCHC: 33.4 g/dL (ref 30.0–36.0)
MCV: 87.9 fL (ref 80.0–100.0)
Monocytes Absolute: 0.6 10*3/uL (ref 0.1–1.0)
Monocytes Relative: 9 %
Neutro Abs: 3.8 10*3/uL (ref 1.7–7.7)
Neutrophils Relative %: 61 %
Platelets: 138 10*3/uL — ABNORMAL LOW (ref 150–400)
RBC: 4.97 MIL/uL (ref 4.22–5.81)
RDW: 13.1 % (ref 11.5–15.5)
WBC: 6.1 10*3/uL (ref 4.0–10.5)
nRBC: 0 % (ref 0.0–0.2)

## 2022-03-13 LAB — COMPREHENSIVE METABOLIC PANEL
ALT: 17 U/L (ref 0–44)
AST: 20 U/L (ref 15–41)
Albumin: 3.7 g/dL (ref 3.5–5.0)
Alkaline Phosphatase: 58 U/L (ref 38–126)
Anion gap: 8 (ref 5–15)
BUN: 20 mg/dL (ref 8–23)
CO2: 25 mmol/L (ref 22–32)
Calcium: 8.9 mg/dL (ref 8.9–10.3)
Chloride: 106 mmol/L (ref 98–111)
Creatinine, Ser: 0.94 mg/dL (ref 0.61–1.24)
GFR, Estimated: >60 mL/min (ref >60)
Glucose, Bld: 95 mg/dL (ref 70–99)
Potassium: 4 mmol/L (ref 3.5–5.1)
Sodium: 139 mmol/L (ref 135–145)
Total Bilirubin: 0.9 mg/dL (ref 0.3–1.2)
Total Protein: 6.2 g/dL — ABNORMAL LOW (ref 6.5–8.1)

## 2022-03-13 LAB — SURGICAL PCR SCREEN
MRSA, PCR: NEGATIVE
Staphylococcus aureus: NEGATIVE

## 2022-03-13 LAB — URINALYSIS, ROUTINE W REFLEX MICROSCOPIC
Bilirubin Urine: NEGATIVE
Glucose, UA: NEGATIVE mg/dL
Hgb urine dipstick: NEGATIVE
Ketones, ur: NEGATIVE mg/dL
Leukocytes,Ua: NEGATIVE
Nitrite: NEGATIVE
Protein, ur: NEGATIVE mg/dL
Specific Gravity, Urine: 1.004 — ABNORMAL LOW (ref 1.005–1.030)
pH: 6 (ref 5.0–8.0)

## 2022-03-13 LAB — TYPE AND SCREEN
ABO/RH(D): O NEG
Antibody Screen: NEGATIVE

## 2022-03-13 NOTE — Patient Instructions (Addendum)
Your procedure is scheduled on: 03/25/22 - Tuesday Report to the Registration Desk on the 1st floor of the [facility]. To find out your arrival time, please call [phone] between 1PM - 3PM on: 03/24/22 - Monday If your arrival time is 6:00 am, do not arrive prior to that time as the [facility] entrance doors do not open until 6:00 am.  REMEMBER: Instructions that are not followed completely may result in serious medical risk, up to and including death; or upon the discretion of your surgeon and anesthesiologist your surgery may need to be rescheduled.  Do not eat food after midnight the night before surgery.  No gum chewing, lozengers or hard candies.  You may however, drink CLEAR liquids up to 2 hours before you are scheduled to arrive for your surgery. Do not drink anything within 2 hours of your scheduled arrival time.  Clear liquids include: - water  - apple juice without pulp - gatorade (not RED colors) - black coffee or tea (Do NOT add milk or creamers to the coffee or tea) Do NOT drink anything that is not on this list.  In addition, your doctor has ordered for you to drink the provided  Ensure Pre-Surgery Clear Carbohydrate Drink  Drinking this carbohydrate drink up to two hours before surgery helps to reduce insulin resistance and improve patient outcomes. Please complete drinking 2 hours prior to scheduled arrival time.  TAKE THESE MEDICATIONS THE MORNING OF SURGERY WITH A SIP OF WATER:  - tamsulosin (FLOMAX)  - finasteride (PROSCAR)    One week prior to surgery beginning 03/18/22:  Stop Anti-inflammatories (NSAIDS) such as Advil, Aleve, Ibuprofen, Motrin, Naproxen, Naprosyn and Aspirin based products such as Excedrin, Goodys Powder, BC Powder.  Stop ANY OVER THE COUNTER supplements until after surgery beginning 03/18/22: MULTIVITAMIN WITH MINERALS ,  omega-3 .  You may take Tylenol if needed for pain up until the day of surgery.  No Alcohol for 24 hours  before or after surgery.  No Smoking including e-cigarettes for 24 hours prior to surgery.  No chewable tobacco products for at least 6 hours prior to surgery.  No nicotine patches on the day of surgery.  Do not use any "recreational" drugs for at least a week prior to your surgery.  Please be advised that the combination of cocaine and anesthesia may have negative outcomes, up to and including death. If you test positive for cocaine, your surgery will be cancelled.  On the morning of surgery brush your teeth with toothpaste and water, you may rinse your mouth with mouthwash if you wish. Do not swallow any toothpaste or mouthwash.  Use CHG Soap or wipes as directed on instruction sheet.  Do not wear jewelry, make-up, hairpins, clips or nail polish.  Do not wear lotions, powders, or perfumes.   Do not shave body from the neck down 48 hours prior to surgery just in case you cut yourself which could leave a site for infection.  Also, freshly shaved skin may become irritated if using the CHG soap.  Contact lenses, hearing aids and dentures may not be worn into surgery.  Do not bring valuables to the hospital. [facility] is not responsible for any missing/lost belongings or valuables.   Total Shoulder Arthroplasty:  use Benzolyl Peroxide 5% Gel as directed on instruction sheet.  Notify your doctor if there is any change in your medical condition (cold, fever, infection).  Wear comfortable clothing (specific to your surgery type) to the hospital.  After surgery, you can help prevent lung complications by doing breathing exercises.  Take deep breaths and cough every 1-2 hours. Your doctor may order a device called an Incentive Spirometer to help you take deep breaths. When coughing or sneezing, hold a pillow firmly against your incision with both hands. This is called "splinting." Doing this helps protect your incision. It also decreases belly discomfort.  If you are being admitted to  the hospital overnight, leave your suitcase in the car. After surgery it may be brought to your room.  If you are being discharged the day of surgery, you will not be allowed to drive home. You will need a responsible adult (18 years or older) to drive you home and stay with you that night.   If you are taking public transportation, you will need to have a responsible adult (18 years or older) with you. Please confirm with your physician that it is acceptable to use public transportation.   Please call the [facility] at [phone] if you have any questions about these instructions.  Surgery Visitation Policy:  Patients undergoing a surgery or procedure may have two family members or support persons with them as long as the person is not COVID-19 positive or experiencing its symptoms.   Inpatient Visitation:    Visiting hours are 7 a.m. to 8 p.m. Up to four visitors are allowed at one time in a patient room. The visitors may rotate out with other people during the day. One designated support person (adult) may remain overnight.  Due to an increase in RSV and influenza rates and associated hospitalizations, children ages 24 and under will not be able to visit patients in [facility]. Masks continue to be strongly recommended.

## 2022-03-20 ENCOUNTER — Encounter: Payer: Self-pay | Admitting: Urology

## 2022-03-20 ENCOUNTER — Ambulatory Visit (INDEPENDENT_AMBULATORY_CARE_PROVIDER_SITE_OTHER): Payer: Medicare Other | Admitting: Urology

## 2022-03-20 VITALS — BP 162/85 | HR 67 | Ht 69.5 in | Wt 181.0 lb

## 2022-03-20 DIAGNOSIS — N401 Enlarged prostate with lower urinary tract symptoms: Secondary | ICD-10-CM | POA: Diagnosis not present

## 2022-03-20 DIAGNOSIS — R3914 Feeling of incomplete bladder emptying: Secondary | ICD-10-CM | POA: Diagnosis not present

## 2022-03-20 LAB — BLADDER SCAN AMB NON-IMAGING: Scan Result: 183

## 2022-03-20 NOTE — Progress Notes (Signed)
03/20/2022 1:08 PM   George Bruce February 02, 1946 [id number]  Referring provider: Idelle Crouch, MD [facility] [place],  [place] [address]  Chief Complaint  Patient presents with   Benign Prostatic Hypertrophy    Urologic history: 1.  Elevated PSA -Prostate biopsy 03/2016; PSA 4.1; prostate volume 73 cc; benign pathology -MRI prostate 06/2016 PSA 6.16; no suspicious lesions   2.  BPH with lower urinary tract symptoms -Finasteride/tamsulosin -Incomplete emptying; PVR ~200 mL   3.  Erectile dysfunction -Generic sildenafil   HPI: 77 y.o. male presents for annual follow-up.  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   Hypertension     Surgical History: Past Surgical History:  Procedure Laterality Date   COLONOSCOPY WITH PROPOFOL N/A 04/21/2018   Procedure: COLONOSCOPY WITH PROPOFOL;  Surgeon: Virgel Manifold, MD;  Location: [facility] ENDOSCOPY;  Service: Endoscopy;  Laterality: N/A;   INGUINAL HERNIA REPAIR Left 1990's   INGUINAL HERNIA REPAIR Right 2015   REVERSE SHOULDER ARTHROPLASTY Left 11/06/2015   Procedure: REVERSE SHOULDER ARTHROPLASTY;  Surgeon: Corky Mull, MD;  Location: [facility] ORS;  Service: Orthopedics;  Laterality: Left;    Home Medications:  Allergies as of 03/20/2022   No Known Allergies      Medication List        Accurate as of March 20, 2022  1:08 PM. If you have any questions, ask your nurse or doctor.          finasteride 5 MG tablet Commonly known as: PROSCAR TAKE 1 TABLET BY MOUTH DAILY.    ibuprofen 600 MG tablet Commonly known as: ADVIL Take 1 tablet (600 mg total) by mouth every 6 (six) hours as needed.   multivitamin with minerals Tabs tablet Take 1 tablet by mouth daily.   naproxen 500 MG tablet Commonly known as: NAPROSYN Take 500 mg by mouth 2 (two) times daily with a meal.   omega-3 acid ethyl esters 1 g capsule Commonly known as: LOVAZA Take 1 g by mouth daily.   simvastatin 40 MG tablet Commonly known as: ZOCOR Take 40 mg by mouth daily at 6 PM.   tamsulosin 0.4 MG Caps capsule Commonly known as: FLOMAX TAKE ONE CAPSULE DAILY 30 MINUTES AFTER THE SAME MEAL EACH DAY        Allergies: No Known Allergies  Family History: Family History  Problem Relation Age of Onset   Diabetes Mother     Social History:  reports that he quit smoking about 27 years ago. His smoking use included cigarettes. He has never used smokeless tobacco. He reports current alcohol use of about 2.0 - 3.0 standard drinks of alcohol per week. He reports that he does not use drugs.   Physical Exam: BP (!) 162/85   Pulse 67   Ht 5' 9.5" (1.765 m)   Wt 181 lb (82.1 kg)   BMI 26.35 kg/m   Constitutional:  Alert and oriented, No acute distress. HEENT: [place] AT, moist mucus membranes.  Trachea midline, no masses. Cardiovascular: No clubbing, cyanosis, or  edema. Respiratory: Normal respiratory effort, no increased work of breathing. Psychiatric: Normal mood and affect.   Assessment & Plan:    1. Benign prostatic hyperplasia with incomplete bladder emptying Stable Tamsulosin/finasteride refilled last month Bladder scan PVR stable at 185 mL Continue annual follow-up    Abbie Sons, MD  [facility] [address], [place], [place] [address] [phone]

## 2022-03-21 ENCOUNTER — Ambulatory Visit: Payer: Medicare Other | Admitting: Urology

## 2022-03-24 MED ORDER — CHLORHEXIDINE GLUCONATE 0.12 % MT SOLN: 15.0000 mL | Freq: Once | OROMUCOSAL | Status: AC

## 2022-03-24 MED ORDER — LACTATED RINGERS IV SOLN: INTRAVENOUS | Status: DC

## 2022-03-24 MED ORDER — CEFAZOLIN SODIUM-DEXTROSE 2-4 GM/100ML-% IV SOLN
2.0000 g | INTRAVENOUS | Status: AC
  Administered 2022-03-25: 2 g via INTRAVENOUS

## 2022-03-24 MED ORDER — ORAL CARE MOUTH RINSE: 15.0000 mL | Freq: Once | OROMUCOSAL | Status: AC

## 2022-03-25 ENCOUNTER — Ambulatory Visit: Payer: Medicare Other

## 2022-03-25 ENCOUNTER — Encounter: Payer: Self-pay | Admitting: Surgery

## 2022-03-25 ENCOUNTER — Ambulatory Visit: Payer: Medicare Other | Admitting: Certified Registered"

## 2022-03-25 ENCOUNTER — Encounter
Admission: RE | Disposition: A | Discharge status: Home / Self Care | Payer: Self-pay | Source: Home / Self Care | Attending: Surgery

## 2022-03-25 ENCOUNTER — Other Ambulatory Visit: Payer: Self-pay

## 2022-03-25 ENCOUNTER — Ambulatory Visit
Admission: RE | Admit: 2022-03-25 | Discharge: 2022-03-25 | Disposition: A | Discharge status: Home / Self Care | Payer: Medicare Other | Attending: Surgery | Admitting: Surgery

## 2022-03-25 DIAGNOSIS — M75121 Complete rotator cuff tear or rupture of right shoulder, not specified as traumatic: Secondary | ICD-10-CM | POA: Diagnosis present

## 2022-03-25 DIAGNOSIS — M19011 Primary osteoarthritis, right shoulder: Secondary | ICD-10-CM | POA: Diagnosis not present

## 2022-03-25 DIAGNOSIS — Z87891 Personal history of nicotine dependence: Secondary | ICD-10-CM | POA: Diagnosis not present

## 2022-03-25 DIAGNOSIS — M7581 Other shoulder lesions, right shoulder: Secondary | ICD-10-CM | POA: Diagnosis not present

## 2022-03-25 HISTORY — PX: REVERSE SHOULDER ARTHROPLASTY: SHX5054

## 2022-03-25 SURGERY — ARTHROPLASTY, SHOULDER, TOTAL, REVERSE
Anesthesia: General | Site: Shoulder | Laterality: Right

## 2022-03-25 MED ORDER — FENTANYL CITRATE (PF) 100 MCG/2ML IJ SOLN
INTRAMUSCULAR | Status: AC
  Filled 2022-03-25: qty 2

## 2022-03-25 MED ORDER — FENTANYL CITRATE PF 50 MCG/ML IJ SOSY: 50.0000 ug | PREFILLED_SYRINGE | Freq: Once | INTRAMUSCULAR | Status: DC

## 2022-03-25 MED ORDER — BUPIVACAINE-EPINEPHRINE (PF) 0.5% -1:200000 IJ SOLN
INTRAMUSCULAR | Status: AC
  Filled 2022-03-25: qty 30

## 2022-03-25 MED ORDER — 0.9 % SODIUM CHLORIDE (POUR BTL) OPTIME
TOPICAL | Status: DC | PRN
  Administered 2022-03-25: 500 mL

## 2022-03-25 MED ORDER — ONDANSETRON HCL 4 MG/2ML IJ SOLN: 4.0000 mg | Freq: Four times a day (QID) | INTRAMUSCULAR | Status: DC | PRN

## 2022-03-25 MED ORDER — BUPIVACAINE-EPINEPHRINE (PF) 0.5% -1:200000 IJ SOLN
INTRAMUSCULAR | Status: DC | PRN
  Administered 2022-03-25: 30 mL

## 2022-03-25 MED ORDER — ONDANSETRON HCL 4 MG/2ML IJ SOLN
INTRAMUSCULAR | Status: DC | PRN
  Administered 2022-03-25: 4 mg via INTRAVENOUS

## 2022-03-25 MED ORDER — CEFAZOLIN SODIUM-DEXTROSE 2-4 GM/100ML-% IV SOLN
2.0000 g | Freq: Four times a day (QID) | INTRAVENOUS | Status: DC
  Administered 2022-03-25: 2 g via INTRAVENOUS

## 2022-03-25 MED ORDER — ONDANSETRON HCL 4 MG PO TABS: 4.0000 mg | ORAL_TABLET | Freq: Four times a day (QID) | ORAL | Status: DC | PRN

## 2022-03-25 MED ORDER — KETOROLAC TROMETHAMINE 15 MG/ML IJ SOLN
INTRAMUSCULAR | Status: AC
  Filled 2022-03-25: qty 1

## 2022-03-25 MED ORDER — FENTANYL CITRATE (PF) 100 MCG/2ML IJ SOLN: 25.0000 ug | INTRAMUSCULAR | Status: DC | PRN

## 2022-03-25 MED ORDER — TRANEXAMIC ACID 1000 MG/10ML IV SOLN
INTRAVENOUS | Status: DC | PRN
  Administered 2022-03-25: 1000 mg via TOPICAL

## 2022-03-25 MED ORDER — ONDANSETRON HCL 4 MG/2ML IJ SOLN: 4.0000 mg | Freq: Once | INTRAMUSCULAR | Status: DC | PRN

## 2022-03-25 MED ORDER — SUGAMMADEX SODIUM 200 MG/2ML IV SOLN
INTRAVENOUS | Status: DC | PRN
  Administered 2022-03-25: 200 mg via INTRAVENOUS

## 2022-03-25 MED ORDER — LIDOCAINE HCL (CARDIAC) PF 100 MG/5ML IV SOSY
PREFILLED_SYRINGE | INTRAVENOUS | Status: DC | PRN
  Administered 2022-03-25: 100 mg via INTRAVENOUS

## 2022-03-25 MED ORDER — PHENYLEPHRINE 80 MCG/ML (10ML) SYRINGE FOR IV PUSH (FOR BLOOD PRESSURE SUPPORT)
PREFILLED_SYRINGE | INTRAVENOUS | Status: DC | PRN
  Administered 2022-03-25: 160 ug via INTRAVENOUS
  Administered 2022-03-25: 200 ug via INTRAVENOUS

## 2022-03-25 MED ORDER — OXYCODONE HCL 5 MG PO TABS: 5.0000 mg | ORAL_TABLET | ORAL | Status: DC | PRN

## 2022-03-25 MED ORDER — PHENYLEPHRINE HCL-NACL 20-0.9 MG/250ML-% IV SOLN
INTRAVENOUS | Status: DC | PRN
  Administered 2022-03-25: 80 ug/min via INTRAVENOUS

## 2022-03-25 MED ORDER — SUCCINYLCHOLINE CHLORIDE 200 MG/10ML IV SOSY
PREFILLED_SYRINGE | INTRAVENOUS | Status: DC | PRN
  Administered 2022-03-25: 100 mg via INTRAVENOUS

## 2022-03-25 MED ORDER — TRANEXAMIC ACID 1000 MG/10ML IV SOLN
INTRAVENOUS | Status: AC
  Filled 2022-03-25: qty 10

## 2022-03-25 MED ORDER — CEFAZOLIN SODIUM-DEXTROSE 2-4 GM/100ML-% IV SOLN
INTRAVENOUS | Status: AC
  Filled 2022-03-25: qty 100

## 2022-03-25 MED ORDER — KETOROLAC TROMETHAMINE 15 MG/ML IJ SOLN
15.0000 mg | Freq: Once | INTRAMUSCULAR | Status: AC
  Administered 2022-03-25: 15 mg via INTRAVENOUS

## 2022-03-25 MED ORDER — BUPIVACAINE HCL (PF) 0.5 % IJ SOLN
INTRAMUSCULAR | Status: AC
  Filled 2022-03-25: qty 10

## 2022-03-25 MED ORDER — METOCLOPRAMIDE HCL 10 MG PO TABS: 5.0000 mg | ORAL_TABLET | Freq: Three times a day (TID) | ORAL | Status: DC | PRN

## 2022-03-25 MED ORDER — ROCURONIUM BROMIDE 100 MG/10ML IV SOLN
INTRAVENOUS | Status: DC | PRN
  Administered 2022-03-25: 40 mg via INTRAVENOUS

## 2022-03-25 MED ORDER — BUPIVACAINE LIPOSOME 1.3 % IJ SUSP
INTRAMUSCULAR | Status: AC
  Filled 2022-03-25: qty 20

## 2022-03-25 MED ORDER — SODIUM CHLORIDE 0.9 % IR SOLN
Status: DC | PRN
  Administered 2022-03-25: 3000 mL

## 2022-03-25 MED ORDER — FENTANYL CITRATE (PF) 100 MCG/2ML IJ SOLN
INTRAMUSCULAR | Status: DC | PRN
  Administered 2022-03-25 (×2): 50 ug via INTRAVENOUS

## 2022-03-25 MED ORDER — DEXAMETHASONE SODIUM PHOSPHATE 10 MG/ML IJ SOLN
INTRAMUSCULAR | Status: DC | PRN
  Administered 2022-03-25: 5 mg via INTRAVENOUS

## 2022-03-25 MED ORDER — POTASSIUM CHLORIDE IN NACL 20-0.9 MEQ/L-% IV SOLN: INTRAVENOUS | Status: DC

## 2022-03-25 MED ORDER — PROPOFOL 10 MG/ML IV BOLUS
INTRAVENOUS | Status: DC | PRN
  Administered 2022-03-25: 150 mg via INTRAVENOUS

## 2022-03-25 MED ORDER — OXYCODONE HCL 5 MG PO TABS: 5.0000 mg | ORAL_TABLET | ORAL | 0 refills | Status: DC | PRN

## 2022-03-25 MED ORDER — PROPOFOL 10 MG/ML IV BOLUS
INTRAVENOUS | Status: AC
  Filled 2022-03-25: qty 20

## 2022-03-25 MED ORDER — METOCLOPRAMIDE HCL 5 MG/ML IJ SOLN: 5.0000 mg | Freq: Three times a day (TID) | INTRAMUSCULAR | Status: DC | PRN

## 2022-03-25 MED ORDER — CHLORHEXIDINE GLUCONATE 0.12 % MT SOLN
OROMUCOSAL | Status: AC
  Administered 2022-03-25: 15 mL via OROMUCOSAL
  Filled 2022-03-25: qty 15

## 2022-03-25 MED ORDER — EPHEDRINE SULFATE (PRESSORS) 50 MG/ML IJ SOLN
INTRAMUSCULAR | Status: DC | PRN
  Administered 2022-03-25: 7.5 mg via INTRAVENOUS

## 2022-03-25 MED ORDER — LIDOCAINE HCL (PF) 1 % IJ SOLN
INTRAMUSCULAR | Status: AC
  Filled 2022-03-25: qty 5

## 2022-03-25 SURGICAL SUPPLY — 68 items
APL PRP STRL LF DISP 70% ISPRP (MISCELLANEOUS) ×1
BASEPLATE GLENOSPHERE 25 (Plate) IMPLANT
BEARING HUMERAL SHLDER 36M STD (Shoulder) IMPLANT
BIT DRILL TWIST 2.7 (BIT) IMPLANT
BLADE SAW SAG 25X90X1.19 (BLADE) ×1 IMPLANT
BRNG HUM STD 36 RVRS SHLDR (Shoulder) ×1 IMPLANT
CHLORAPREP W/TINT 26 (MISCELLANEOUS) ×1 IMPLANT
COOLER POLAR GLACIER W/PUMP (MISCELLANEOUS) ×1 IMPLANT
COVER BACK TABLE REUSABLE LG (DRAPES) ×1 IMPLANT
DRAPE 3/4 80X56 (DRAPES) ×1 IMPLANT
DRAPE INCISE IOBAN 66X45 STRL (DRAPES) ×1 IMPLANT
DRSG OPSITE POSTOP 4X8 (GAUZE/BANDAGES/DRESSINGS) ×1 IMPLANT
DRSG XEROFORM 1X8 (GAUZE/BANDAGES/DRESSINGS) IMPLANT
ELECT BLADE 6.5 EXT (BLADE) IMPLANT
ELECT CAUTERY BLADE 6.4 (BLADE) ×1 IMPLANT
ELECT REM PT RETURN 9FT ADLT (ELECTROSURGICAL) ×1
ELECTRODE REM PT RTRN 9FT ADLT (ELECTROSURGICAL) ×1 IMPLANT
GLENOID SPHERE 36MM CVD +3 (Orthopedic Implant) IMPLANT
GLOVE BIO SURGEON STRL SZ7.5 (GLOVE) ×4 IMPLANT
GLOVE BIO SURGEON STRL SZ8 (GLOVE) ×4 IMPLANT
GLOVE BIOGEL PI IND STRL 8 (GLOVE) ×2 IMPLANT
GLOVE SURG UNDER LTX SZ8 (GLOVE) ×1 IMPLANT
GOWN STRL REUS W/ TWL LRG LVL3 (GOWN DISPOSABLE) ×1 IMPLANT
GOWN STRL REUS W/ TWL XL LVL3 (GOWN DISPOSABLE) ×1 IMPLANT
GOWN STRL REUS W/TWL LRG LVL3 (GOWN DISPOSABLE) ×1
GOWN STRL REUS W/TWL XL LVL3 (GOWN DISPOSABLE) ×3
HOOD PEEL AWAY T7 (MISCELLANEOUS) ×3 IMPLANT
IV NS IRRIG 3000ML ARTHROMATIC (IV SOLUTION) ×1 IMPLANT
KIT STABILIZATION SHOULDER (MISCELLANEOUS) ×1 IMPLANT
KIT TURNOVER KIT A (KITS) ×1 IMPLANT
MANIFOLD NEPTUNE II (INSTRUMENTS) ×1 IMPLANT
MASK FACE SPIDER DISP (MASK) ×1 IMPLANT
MAT ABSORB  FLUID 56X50 GRAY (MISCELLANEOUS) ×1
MAT ABSORB FLUID 56X50 GRAY (MISCELLANEOUS) ×1 IMPLANT
NDL MAYO CATGUT SZ1 (NEEDLE) IMPLANT
NDL SAFETY ECLIP 18X1.5 (MISCELLANEOUS) ×1 IMPLANT
NDL SPNL 20GX3.5 QUINCKE YW (NEEDLE) ×1 IMPLANT
NEEDLE MAYO CATGUT SZ1 (NEEDLE) IMPLANT
NEEDLE SPNL 20GX3.5 QUINCKE YW (NEEDLE) ×1 IMPLANT
NS IRRIG 500ML POUR BTL (IV SOLUTION) ×1 IMPLANT
PACK ARTHROSCOPY SHOULDER (MISCELLANEOUS) ×1 IMPLANT
PAD ARMBOARD 7.5X6 YLW CONV (MISCELLANEOUS) ×1 IMPLANT
PAD WRAPON POLAR SHDR UNIV (MISCELLANEOUS) ×1 IMPLANT
PIN THREADED REVERSE (PIN) IMPLANT
PULSAVAC PLUS IRRIG FAN TIP (DISPOSABLE) ×1
SCREW BONE LOCKING 4.75X30X3.5 (Screw) IMPLANT
SCREW BONE LOCKING 4.75X35X3.5 (Screw) IMPLANT
SCREW BONE STRL 6.5MMX25MM (Screw) IMPLANT
SCREW LOCKING 4.75MMX15MM (Screw) IMPLANT
SHOULDER HUMERAL BEAR 36M STD (Shoulder) ×1 IMPLANT
SLING ULTRA II M (MISCELLANEOUS) IMPLANT
SPONGE T-LAP 18X18 ~~LOC~~+RFID (SPONGE) ×2 IMPLANT
STAPLER SKIN PROX 35W (STAPLE) ×1 IMPLANT
STEM HUMERAL MICRO SZ13 (Stem) IMPLANT
SUT ETHIBOND 0 MO6 C/R (SUTURE) ×1 IMPLANT
SUT FIBERWIRE #2 38 BLUE 1/2 (SUTURE) ×4
SUT VIC AB 0 CT1 36 (SUTURE) ×1 IMPLANT
SUT VIC AB 2-0 CT1 27 (SUTURE) ×2
SUT VIC AB 2-0 CT1 TAPERPNT 27 (SUTURE) ×2 IMPLANT
SUTURE FIBERWR #2 38 BLUE 1/2 (SUTURE) ×4 IMPLANT
SYR 10ML LL (SYRINGE) ×1 IMPLANT
SYR 30ML LL (SYRINGE) ×1 IMPLANT
SYR TOOMEY 50ML (SYRINGE) ×1 IMPLANT
TIP FAN IRRIG PULSAVAC PLUS (DISPOSABLE) ×1 IMPLANT
TRAP FLUID SMOKE EVACUATOR (MISCELLANEOUS) ×1 IMPLANT
TRAY HUMERAL NEUTRAL EXT 6 (Shoulder) IMPLANT
WATER STERILE IRR 500ML POUR (IV SOLUTION) ×1 IMPLANT
WRAPON POLAR PAD SHDR UNIV (MISCELLANEOUS) ×1

## 2022-03-25 NOTE — Anesthesia Preprocedure Evaluation (Signed)
Anesthesia Evaluation  Patient identified by MRN, date of birth, ID band Patient awake    Reviewed: Allergy & Precautions, NPO status , Patient's Chart, lab work & pertinent test results, reviewed documented beta blocker date and time   History of Anesthesia Complications Negative for: history of anesthetic complications  Airway Mallampati: II  TM Distance: >3 FB     Dental  (+) Partial Lower, Partial Upper, Dental Advidsory Given, Caps, Missing   Pulmonary neg pulmonary ROS, former smoker   Pulmonary exam normal        Cardiovascular Exercise Tolerance: Good negative cardio ROS Normal cardiovascular exam     Neuro/Psych negative neurological ROS  negative psych ROS   GI/Hepatic Neg liver ROS,GERD  ,,  Endo/Other  negative endocrine ROS    Renal/GU negative Renal ROS     Musculoskeletal   Abdominal   Peds  Hematology   Anesthesia Other Findings Past Medical History: No date: Arthritis     Comment:  hips and lower back No date: Diverticulitis 1995: Elevated cholesterol No date: Enlarged prostate 12/21/2014: Hx of basal cell carcinoma     Comment:  multiple sites  01/03/2016: Hx of squamous cell carcinoma     Comment:  Left mid med scapula No date: Hypertension   Reproductive/Obstetrics                             Anesthesia Physical Anesthesia Plan  ASA: 2  Anesthesia Plan: General   Post-op Pain Management: Regional block*   Induction: Intravenous  PONV Risk Score and Plan: 2 and Ondansetron, Dexamethasone and Treatment may vary due to age or medical condition  Airway Management Planned: Oral ETT  Additional Equipment:   Intra-op Plan:   Post-operative Plan: Extubation in OR  Informed Consent: I have reviewed the patients History and Physical, chart, labs and discussed the procedure including the risks, benefits and alternatives for the proposed anesthesia with the  patient or authorized representative who has indicated his/her understanding and acceptance.       Plan Discussed with: CRNA  Anesthesia Plan Comments:         Anesthesia Quick Evaluation

## 2022-03-25 NOTE — Anesthesia Procedure Notes (Signed)
Procedure Name: Intubation Date/Time: 03/25/2022 10:44 AM  Performed by: Fredderick Phenix, CRNAPre-anesthesia Checklist: Patient identified, Emergency Drugs available, Suction available and Patient being monitored Patient Re-evaluated:Patient Re-evaluated prior to induction Oxygen Delivery Method: Circle system utilized Preoxygenation: Pre-oxygenation with 100% oxygen Induction Type: IV induction Ventilation: Mask ventilation without difficulty Laryngoscope Size: McGraph and 4 Grade View: Grade I Tube type: Oral Tube size: 7.5 mm Number of attempts: 1 Airway Equipment and Method: Stylet and Oral airway Placement Confirmation: ETT inserted through vocal cords under direct vision, positive ETCO2 and breath sounds checked- equal and bilateral Secured at: 21 cm Tube secured with: Tape Dental Injury: Teeth and Oropharynx as per pre-operative assessment

## 2022-03-25 NOTE — Evaluation (Signed)
Occupational Therapy Evaluation Patient Details Name: George Bruce MRN: [id number] DOB: 30-Nov-1945 Today's Date: 03/25/2022   History of Present Illness Pt is a 77 y.o. male s/p reverse R TSA on 03/25/22.   Clinical Impression   Patient seen for OT evaluation this date. PTA pt lived with spouse and was independent in ADLs/IADLs. Pt/spouse instructed in polar care mgt, sling/immobilizer mgt, RUE precautions, adaptive strategies for bathing/dressing/toileting/grooming, positioning for sleep, and home/routines modifications to maximize fall prevention, safety, and independence. Handout provided. During evaluation, pt required Mod-Max A for UB dressing to don long sleeve shirt and Max A to don/doff sling and polar care. Shoulder sling/immobilizer and polar care adjusted to improve comfort, optimize positioning, and to maximize skin integrity. Pt/spouse verbalized understanding of all education provided. Recommend skilled OT services upon discharge to maximize return to PLOF, address home/routines modifications and safety, minimize falls risk, and minimize caregiver burden.      Recommendations for follow up therapy are one component of a multi-disciplinary discharge planning process, led by the attending physician.  Recommendations may be updated based on patient status, additional functional criteria and insurance authorization.   Follow Up Recommendations  Follow physician's recommendations for discharge plan and follow up therapies     Assistance Recommended at Discharge Intermittent Supervision/Assistance  Patient can return home with the following A lot of help with bathing/dressing/bathroom;A little help with walking and/or transfers;Assistance with cooking/housework;Assist for transportation;Help with stairs or ramp for entrance    Functional Status Assessment  Patient has had a recent decline in their functional status and demonstrates the ability to make significant improvements in  function in a reasonable and predictable amount of time.  Equipment Recommendations  None recommended by OT    Recommendations for Other Services       Precautions / Restrictions Precautions Precautions: Fall;Shoulder Shoulder Interventions: Shoulder sling/immobilizer Precaution Booklet Issued: Yes (comment) Required Braces or Orthoses: Sling Restrictions Weight Bearing Restrictions: Yes RUE Weight Bearing: Non weight bearing      Mobility Bed Mobility               General bed mobility comments: NT, received/left in recliner    Transfers Overall transfer level: Modified independent Equipment used: None                      Balance Overall balance assessment: No apparent balance deficits (not formally assessed)                                         ADL either performed or assessed with clinical judgement   ADL Overall ADL's : Needs assistance/impaired                 Upper Body Dressing : Moderate assistance;Maximal assistance;Cueing for compensatory techniques;Sitting Upper Body Dressing Details (indicate cue type and reason): Mod-Max A to don long sleeve shirt using compensatory technique, Max A to don/doff sling and polar care                         Vision Baseline Vision/History: 1 Wears glasses Patient Visual Report: No change from baseline       Perception     Praxis      Pertinent Vitals/Pain Pain Assessment Pain Assessment: No/denies pain     Hand Dominance Left   Extremity/Trunk Assessment Upper Extremity Assessment Upper Extremity Assessment:  RUE deficits/detail RUE Deficits / Details: in sling, NWB, pt endorsed UE is still "numb" from nerve block RUE: Unable to fully assess due to immobilization   Lower Extremity Assessment Lower Extremity Assessment: Overall WFL for tasks assessed       Communication Communication Communication: No difficulties   Cognition Arousal/Alertness:  Awake/alert Behavior During Therapy: WFL for tasks assessed/performed Overall Cognitive Status: Within Functional Limits for tasks assessed                                       General Comments       Exercises Other Exercises Other Exercises: Education provided re: RUE NWB, AROM of uninvolved joints including hand/wrist/elbow, avoid lifting/pulling/pushing activities with RUE, how to don/doff sling and polar care system, LB dressing AE (reacher), compensatory dressing techniques ("first in last out", seesaw method).   Shoulder Instructions      Home Living Family/patient expects to be discharged to:: Private residence Living Arrangements: Spouse/significant other Available Help at Discharge: Family;Available 24 hours/day Type of Home: House Home Access: Stairs to enter [employer] of Steps: 3 Entrance Stairs-Rails: Right Home Layout: One level     Bathroom Shower/Tub: Teacher, early years/pre: Standard     Home Equipment: Hand held shower head          Prior Functioning/Environment Prior Level of Function : Independent/Modified Independent;Driving               ADLs Comments: Independent        OT Problem List: Decreased range of motion;Impaired UE functional use;Decreased knowledge of use of DME or AE;Decreased knowledge of precautions      OT Treatment/Interventions:      OT Goals(Current goals can be found in the care plan section) Acute Rehab OT Goals Patient Stated Goal: return home OT Goal Formulation: All assessment and education complete, DC therapy  OT Frequency:      Co-evaluation              AM-PAC OT "6 Clicks" Daily Activity     Outcome Measure Help from another person eating meals?: None Help from another person taking care of personal grooming?: A Little Help from another person toileting, which includes using toliet, bedpan, or urinal?: A Lot Help from another person bathing (including  washing, rinsing, drying)?: A Lot Help from another person to put on and taking off regular upper body clothing?: A Lot Help from another person to put on and taking off regular lower body clothing?: A Lot 6 Click Score: 15   End of Session Equipment Utilized During Treatment: Other (comment) (polar care, RUE sling/immoblizer) Nurse Communication: Mobility status;Precautions;Weight bearing status  Activity Tolerance: Patient tolerated treatment well Patient left: in chair;with call bell/phone within reach;with family/visitor present  OT Visit Diagnosis: Other abnormalities of gait and mobility (R26.89)                Time: 5366-4403 OT Time Calculation (min): 28 min Charges:  OT General Charges $OT Visit: 1 Visit OT Evaluation $OT Eval Moderate Complexity: 1 Mod  [place] Brule, OTR/L ascom [phone]  03/25/22, 5:36 PM

## 2022-03-25 NOTE — Discharge Instructions (Addendum)
Orthopedic discharge instructions: May shower with intact OpSite dressing once nerve block has worn off (around post-op day #4 - Saturday).  Cover staples with Band-Aids after drying off. Apply ice frequently to shoulder or use Polar Care device. Take naproxen 500 mg BID OR Advil 600-800 mg TID with meals for 5-7 days, then as necessary. Take oxycodone as prescribed when needed.  May supplement with ES Tylenol if necessary. Keep shoulder immobilizer on at all times except may remove for bathing purposes. Follow-up in 10-14 days or as scheduled.  AMBULATORY SURGERY  DISCHARGE INSTRUCTIONS   The drugs that you were given will stay in your system until tomorrow so for the next 24 hours you should not:  Drive an automobile Make any legal decisions Drink any alcoholic beverage   You may resume regular meals tomorrow.  Today it is better to start with liquids and gradually work up to solid foods.  You may eat anything you prefer, but it is better to start with liquids, then soup and crackers, and gradually work up to solid foods.   Please notify your doctor immediately if you have any unusual bleeding, trouble breathing, redness and pain at the surgery site, drainage, fever, or pain not relieved by medication.    Additional Instructions:        Please contact your physician with any problems or Same Day Surgery at [phone], Monday through Friday 6 am to 4 pm, or [place] at [facility] number at [phone].  POLAR CARE INFORMATION  [email or web address]  How to use [facility] Cold Therapy System?  YouTube   [email or web address]  OPERATING INSTRUCTIONS  Start the product With dry hands, connect the transformer to the electrical connection located on the top of the cooler. Next, plug the transformer into an appropriate electrical outlet. The unit will automatically start running at this point.  To stop the pump, disconnect electrical  power.  Unplug to stop the product when not in use. Unplugging the Polar Care unit turns it off. Always unplug immediately after use. Never leave it plugged in while unattended. Remove pad.    FIRST ADD WATER TO FILL LINE, THEN ICE---Replace ice when existing ice is almost melted  1 Discuss Treatment with your [place] Practitioner and Use Only as Prescribed 2 Apply Insulation Barrier & Cold Therapy Pad 3 Check for Moisture 4 Inspect Skin Regularly  Tips and Trouble Shooting Usage Tips 1. Use cubed or chunked ice for optimal performance. 2. It is recommended to drain the Pad between uses. To drain the pad, hold the Pad upright with the hose pointed toward the ground. Depress the black plunger and allow water to drain out. 3. You may disconnect the Pad from the unit without removing the pad from the affected area by depressing the silver tabs on the hose coupling and gently pulling the hoses apart. The Pad and unit will seal itself and will not leak. Note: Some dripping during release is normal. 4. DO NOT RUN PUMP WITHOUT WATER! The pump in this unit is designed to run with water. Running the unit without water will cause permanent damage to the pump. 5. Unplug unit before removing lid.  TROUBLESHOOTING GUIDE Pump not running, Water not flowing to the pad, Pad is not getting cold 1. Make sure the transformer is plugged into the wall outlet. 2. Confirm that the ice and water are filled to the indicated levels. 3. Make sure there are no kinks in the pad. 4. Gently pull  on the blue tube to make sure the tube/pad junction is straight. 5. Remove the pad from the treatment site and ll it while the pad is lying at; then reapply. 6. Confirm that the pad couplings are securely attached to the unit. Listen for the double clicks (Figure 1) to confirm the pad couplings are securely attached.  Leaks    Note: Some condensation on the lines, controller, and pads is unavoidable, especially in  warmer climates. 1. If using a [place]reg Polar Care Cold Therapy unit with a detachable Cold Therapy Pad, and a leak exists (other than condensation on the lines) disconnect the pad couplings. Make sure the silver tabs on the couplings are depressed before reconnecting the pad to the pump hose; then confirm both sides of the coupling are properly clicked in. 2. If the coupling continues to leak or a leak is detected in the pad itself, stop using it and call [place] at [phone].  Cleaning After use, empty and dry the unit with a soft cloth. Warm water and mild detergent may be used occasionally to clean the pump and tubes.  WARNING: The [place] can be cold enough to cause serious injury, including full skin necrosis. Follow these Operating Instructions, and carefully read the Product Insert (see pouch on side of unit) and the Cold Therapy Pad Fitting Instructions (provided with each Cold Therapy Pad) prior to use.  SHOULDER SLING IMMOBILIZER   VIDEO Slingshot 2 Shoulder Brace Application - YouTube ---[email or web address]  INSTRUCTIONS While supporting the injured arm, slide the forearm into the sling. Wrap the adjustable shoulder strap around the neck and shoulders and attach the strap end to the sling using  the "alligator strap tab."  Adjust the shoulder strap to the required length. Position the shoulder pad behind the neck. To secure the shoulder pad location (optional), pull the shoulder strap away from the shoulder pad, unfold the hook material on the top of the pad, then press the shoulder strap back onto the hook material to secure the pad in place. Attach the closure strap across the open top of the sling. Position the strap so that it holds the arm securely in the sling. Next, attach the thumb strap to the open end of the sling between the thumb and fingers. After sling has been fit, it may be easily removed and reapplied using the quick release  buckle on shoulder strap. If a neutral pillow or 15 abduction pillow is included, place the pillow at the waistline. Attach the sling to the pillow, lining up hook material on the pillow with the loop on sling. Adjust the waist strap to fit.  If waist strap is too long, cut it to fit. Use the small piece of double sided hook material (located on top of the pillow) to secure the strap end. Place the double sided hook material on the inside of the cut strap end and secure it to the waist strap.     If no pillow is included, attach the waist strap to the sling and adjust to fit.    Washing Instructions: Straps and sling must be removed and cleaned regularly depending on your activity level and perspiration. Hand wash straps and sling in cold water with mild detergent, rinse, air dry        Interscalene Nerve Block with Exparel   For your surgery you have received an Interscalene Nerve Block with Exparel. Nerve Blocks affect many types of nerves, including nerves that control movement,  pain and normal sensation.  You may experience feelings such as numbness, tingling, heaviness, weakness or the inability to move your arm or the feeling or sensation that your arm has "fallen asleep". A nerve block with Exparel can last up to 5 days.  Usually the weakness wears off first.  The tingling and heaviness usually wear off next.  Finally you may start to notice pain.  Keep in mind that this may occur in any order.  Once a nerve block starts to wear off it is usually completely gone within 60 minutes. ISNB may cause mild shortness of breath, a hoarse voice, blurry vision, unequal pupils, or drooping of the face on the same side as the nerve block.  These symptoms will usually resolve with the numbness.  Very rarely the procedure itself can cause mild seizures. If needed, your surgeon will give you a prescription for pain medication.  It will take about 60 minutes for the oral pain medication to become fully  effective.  So, it is recommended that you start taking this medication before the nerve block first begins to wear off, or when you first begin to feel discomfort. Take your pain medication only as prescribed.  Pain medication can cause sedation and decrease your breathing if you take more than you need for the level of pain that you have. Nausea is a common side effect of many pain medications.  You may want to eat something before taking your pain medicine to prevent nausea. After an Interscalene nerve block, you cannot feel pain, pressure or extremes in temperature in the effected arm.  Because your arm is numb it is at an increased risk for injury.  To decrease the possibility of injury, please practice the following:  While you are awake change the position of your arm frequently to prevent too much pressure on any one area for prolonged periods of time.  If you have a cast or tight dressing, check the color or your fingers every couple of hours.  Call your surgeon with the appearance of any discoloration (white or blue). If you are given a sling to wear before you go home, please wear it  at all times until the block has completely worn off.  Do not get up at night without your sling. Please contact [facility] or your surgeon if you do not begin to regain sensation after 7 days from the surgery.  Anesthesia may be contacted by calling the Same Day Surgery Department, Mon. through Fri., 6 am to 4 pm at [phone].   If you experience any other problems or concerns, please contact your surgeon's office. If you experience severe or prolonged shortness of breath go to the nearest emergency department.

## 2022-03-25 NOTE — H&P (Signed)
History of Present Illness: George Bruce is a 77 y.o. who presents today for a history and physical. He is to undergo a reverse right total shoulder arthroplasty on 03/25/2022. Since his last visit here in the clinic there is been no change in his condition. Patient has elected to proceed with surgery of the right shoulder.  Patient has been experiencing discomfort to the right shoulder with no known injury or change of activities for a little over 1 year. He has received cortisone injections which lasted a few months. Did have MRI performed of the right shoulder. Was noted to have massive irreparable rotator cuff tear with early cuff arthropathy.The patient notes continued discomfort in his shoulder which he rates at 2/10 on today's visit, and for which he has been taking Naprosyn as necessary with temporary partial relief of his symptoms. He continues to perform a lot of work around his house without difficulty, but notes he is unable to reach his arm even to shoulder level much less above shoulder level. He also has difficulty reaching behind his back. He has discomfort at night. He denies any recent reinjury to the shoulder, and denies any numbness or paresthesias down his arm to his hand.   Past Medical History: Hyperlipidemia   Past Surgical History: REPAIR INGUINAL HERNIA Right 2015 (Dr. 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 Roland Rack)  [place] Left (years ago)   Past Family History: Diabetes Mother  Heart disease Mother   Medications: finasteride (PROSCAR) 5 mg tablet Take 5 mg by mouth.  naproxen (NAPROSYN) 500 MG tablet TAKE 1 TABLET BY MOUTH 2 TIMES DAILY AS NEEDED 180 tablet 1  sildenafil (REVATIO) 20 mg tablet TAKE 3 TO 5 TABLETS EVERY DAY AS NEEDED 60 tablet 2  simvastatin (ZOCOR) 80 MG tablet Take 0.5 tablets (40 mg total) by mouth at bedtime 90 tablet 1  tamsulosin (FLOMAX) 0.4 mg capsule Take 0.4 mg by mouth once daily. Take 30  minutes after same meal each day.   Allergies: No Known Allergies   Review of Systems A comprehensive 14 point ROS was performed, reviewed, and the pertinent orthopaedic findings are documented in the HPI.  Physical Exam: BP 130/72 (BP Location: Left upper arm, Patient Position: Sitting, BP Cuff Size: Adult)  Ht 165.1 cm ('5\' 5"'$ )  Wt 80.7 kg (178 lb)  BMI 29.62 kg/m   General: Well-developed well-nourished male seen in no acute distress.   HEENT: Atraumatic,normocephalic. Pupils are equal and reactive to light. Oropharynx is clear with moist mucosa  Lungs: Clear to auscultation bilaterally   Cardiovascular: Regular rate and rhythm. Normal S1, S2. No murmurs. No appreciable gallops or rubs. Peripheral pulses are palpable.  Abdomen: Soft, non-tender, nondistended. Bowel sounds present  Right shoulder exam: SKIN: Normal SWELLING: None WARMTH: None LYMPH NODES: No adenopathy palpable CREPITUS: None TENDERNESS: Minimally tender over anterolateral shoulder ROM (active):  Forward flexion: 85 degrees Abduction: 75 degrees Internal rotation: L1 ROM (passive):  Forward flexion: 155 degrees Abduction: 150 degrees  ER/IR at 90 abd: 90 degrees/65 degrees  He describes mild pain with attempted active forward flexion and abduction.  STRENGTH: Forward flexion: 3/5 Abduction: 3/5 External rotation: 2+-3/5 Internal rotation: 4/5 Pain with RC testing: Mild pain with resisted forward flexion, abduction, and external rotation.  STABILITY: Normal  SPECIAL TESTS: Luan Pulling' test: Negative Speed's test: Not evaluated Capsulitis - pain w/ passive ER: No Crossed arm test: No Crank: Not evaluated Anterior apprehension: Negative Posterior apprehension: Not evaluated  Neurological:  The patient is alert and oriented Sensation to light touch appears to be intact and within normal limits Gross motor strength appeared to be equal to 5/5  Vascular : Peripheral pulses felt to be  palpable. Capillary refill appears to be intact and within normal limits  X-ray: MRI of the right shoulder demonstrates evidence of chronic full-thickness tears of the subscapularis tendon, the supraspinatus tendon, and the infraspinatus tendon with retraction back to the glenoid. There is moderate muscle atrophy of these muscles as well. The biceps tendon also has torn and retracted distally. There is mild degenerative changes of the glenohumeral joint as well.   Impression: 1. Nontraumatic complete tear right rotator cuff 2. Rotator cuff arthropathy right shoulder  Plan:  The treatment options were discussed with the patient. In addition, patient educational materials were provided regarding the diagnosis and treatment options. The patient is frustrated by his symptoms and function limitations, and is ready to consider more aggressive treatment options. Therefore, I have recommended a surgical procedure, specifically a reverse right total shoulder arthroplasty. The procedure was discussed with the patient, as were the potential risks (including bleeding, infection, nerve and/or blood vessel injury, persistent or recurrent pain, loosening and/or failure of the components, dislocation, need for further surgery, blood clots, strokes, heart attacks and/or arhythmias, pneumonia, etc.) and benefits. The patient states his understanding and wishes to proceed. All of the patient's questions and concerns were answered. He can call any time with further concerns. He will follow up post-surgery, routine.    H&P reviewed and patient re-examined. No changes.

## 2022-03-25 NOTE — Transfer of Care (Signed)
Immediate Anesthesia Transfer of Care Note  Patient: George Bruce  Procedure(s) Performed: REVERSE SHOULDER ARTHROPLASTY (Right: Shoulder)  Patient Location: PACU  Anesthesia Type:General  Level of Consciousness: awake and alert   Airway & Oxygen Therapy: Patient Spontanous Breathing and Patient connected to face mask oxygen  Post-op Assessment: Report given to RN and Post -op Vital signs reviewed and stable  Post vital signs: Reviewed and stable  Last Vitals:  Vitals Value Taken Time  BP 133/67 03/25/22 1300  Temp    Pulse 79 03/25/22 1301  Resp 15 03/25/22 1301  SpO2 97 % 03/25/22 1301  Vitals shown include unvalidated device data.  Last Pain:  Vitals:   03/25/22 0936  TempSrc:   PainSc: 0-No pain         Complications: No notable events documented.

## 2022-03-25 NOTE — Op Note (Signed)
03/25/2022  1:04 PM  Patient:   George Bruce  Pre-Op Diagnosis:   Massive irreparable rotator cuff tear with cuff arthropathy, right shoulder.  Post-Op Diagnosis:   Same  Procedure:   Reverse right total shoulder arthroplasty with biceps tenodesis.  Surgeon:   Pascal Lux, MD  Assistant:   Cameron Proud, PA-C  Anesthesia:   General endotracheal with an interscalene block using Exparel placed preoperatively by the anesthesiologist.  Findings:   As above.  Complications:   None  EBL:   50 cc  Fluids:   900 cc crystalloid  UOP:   None  TT:   None  Drains:   None  Closure:   Staples  Implants:   All press-fit [employer] Comprehensive system with a #13 Identity micro-humeral stem, a -6 mm extended neutral Identity humeral tray with a +0 mm insert, and a mini-base plate with a 36 mm +3 mm lateralized glenosphere.  Brief Clinical Note:   The patient is a 77 year old male with a long history of progressively worsening pain and weakness of his right shoulder. His symptoms have progressed despite medications, activity modification, etc. His history and examination consistent with a massive irreparable rotator cuff tear with cuff arthropathy, all of which were confirmed by MRI scan preoperatively. The patient presents at this time for a reverse right total shoulder arthroplasty.  Procedure:   The patient underwent placement of an interscalene block using Exparel by the anesthesiologist in the preoperative holding area before being brought into the operating room and lain in the supine position. The patient then underwent general endotracheal intubation and anesthesia before the patient was repositioned in the beach chair position using the beach chair positioner. The right shoulder and upper extremity were prepped with ChloraPrep solution before being draped sterilely. Preoperative antibiotics were administered. A timeout was performed to verify the appropriate surgical site.     A standard anterior approach to the shoulder was made through an approximately 4-5 inch incision. The incision was carried down through the subcutaneous tissues to expose the deltopectoral fascia. The interval between the deltoid and pectoralis muscles was identified and this plane developed, retracting the cephalic vein laterally with the deltoid muscle. The conjoined tendon was identified. Its lateral margin was dissected and the Kolbel self-retraining retractor inserted. The "three sisters" were identified and cauterized. Bursal tissues were removed to improve visualization.   The biceps tendon was identified near the inferior aspect of the bicipital groove. A soft tissue tenodesis was performed by attaching the biceps tendon to the adjacent pectoralis major tendon using two #0 Ethibond interrupted sutures. The biceps tendon was then transected just proximal to the tenodesis site. The subscapularis tendon was released from its attachment to the lesser tuberosity 1 cm proximal to its insertion and several tagging sutures placed. The inferior capsule was released with care after identifying and protecting the axillary nerve. The proximal humeral cut was made at approximately 25 of retroversion using the extra-medullary guide.   Attention was redirected to the glenoid. The labrum was debrided circumferentially before the center of the glenoid was marked with electrocautery. The guidewire was drilled into the glenoid vault using the appropriate guide. After verifying its position, it was overreamed with the mini-baseplate reamer to create a flat surface. The permanent mini-baseplate was impacted into place. It was stabilized with a 25 x 6.5 mm central screw and four peripheral locking screws. The permanent +3 mm lateralized 36 mm glenosphere was then impacted into place and its Morse taper locking  mechanism verified using manual distraction.  Attention was directed to the humeral side. The humeral canal  was reamed sequentially beginning with the end-cutting reamer then progressing from a 4 mm reamer up to a 13 mm reamer. This provided excellent circumferential chatter. The canal was broached beginning with a #10 broach and progressing to a #13 broach.  The plastic stem was inserted into the end of the broach and the proximal reaming performed. A trial reduction was performed using the -6 mm extended neutral humeral platform with the +0 mm insert. With the +0 mm insert, the arm demonstrated excellent range of motion as the hand could be brought across the chest to the opposite shoulder and brought to the top of the patient's head and to the patient's ear. The shoulder appeared stable throughout this range of motion. The joint was dislocated and the trial components removed.   The permanent #13 Identity micro-stem was connected with the -6 mm extended neutral humeral platform on the back table before this construct was impacted into place with care taken to maintain the appropriate version. The +0 mm insert was impacted into place. The shoulder was relocated using two finger pressure and again placed through a range of motion with the findings as described above.  The wound was copiously irrigated with sterile saline solution using the jet lavage system before a total of 30 cc of 0.5% Sensorcaine with epinephrine was injected into the pericapsular and peri-incisional tissues to help with postoperative analgesia. The subscapularis tendon was reapproximated using #2 FiberWire interrupted sutures. The deltopectoral interval was closed using #0 Vicryl interrupted sutures before the subcutaneous tissues were closed using 2-0 Vicryl interrupted sutures. The skin was closed using staples. Prior to closing the skin, 1 g of transexemic acid in 10 cc of normal saline was injected intra-articularly to help with postoperative bleeding. A sterile occlusive dressing was applied to the wound before the arm was placed into a  shoulder immobilizer with an abduction pillow. A Polar Care system also was applied to the shoulder. The patient was then transferred back to a hospital bed before being awakened, extubated, and returned to the recovery room in satisfactory condition after tolerating the procedure well.

## 2022-03-25 NOTE — Anesthesia Procedure Notes (Signed)
Anesthesia Regional Block: Interscalene brachial plexus block   Pre-Anesthetic Checklist: , timeout performed,  Correct Patient, Correct Site, Correct Laterality,  Correct Procedure, Correct Position, site marked,  Risks and benefits discussed,  Surgical consent,  Pre-op evaluation,  At surgeon's request and post-op pain management  Laterality: Right and Upper  Prep: chloraprep       Needles:  Injection technique: Single-shot  Needle Type: Stimiplex     Needle Length: 5cm  Needle Gauge: 22     Additional Needles:   Procedures:,,,, ultrasound used (permanent image in chart),,    Narrative:  Start time: 03/25/2022 9:35 AM End time: 03/25/2022 9:37 AM Injection made incrementally with aspirations every 5 mL.  Performed by: Personally  Anesthesiologist: Martha Clan, MD  Additional Notes: Functioning IV was confirmed and monitors were applied.  A 28m 22ga Stimuplex needle was used. Sterile prep and drape,hand hygiene and sterile gloves were used.  Negative aspiration and negative test dose prior to incremental administration of local anesthetic. The patient tolerated the procedure well.

## 2022-03-27 LAB — SURGICAL PATHOLOGY

## 2022-03-27 NOTE — Anesthesia Postprocedure Evaluation (Signed)
Anesthesia Post Note  Patient: George Bruce  Procedure(s) Performed: REVERSE SHOULDER ARTHROPLASTY (Right: Shoulder)  Patient location during evaluation: PACU Anesthesia Type: General Level of consciousness: awake and alert Pain management: pain level controlled Vital Signs Assessment: post-procedure vital signs reviewed and stable Respiratory status: spontaneous breathing, nonlabored ventilation, respiratory function stable and patient connected to nasal cannula oxygen Cardiovascular status: blood pressure returned to baseline and stable Postop Assessment: no apparent nausea or vomiting Anesthetic complications: no   No notable events documented.   Last Vitals:  Vitals:   03/25/22 1445 03/25/22 1638  BP: 130/69 (!) 148/87  Pulse: 77 75  Resp: 20 20  Temp: 36.8 C   SpO2: 93% 97%    Last Pain:  Vitals:   03/26/22 0854  TempSrc:   PainSc: 0-No pain                 Martha Clan

## 2022-04-08 ENCOUNTER — Encounter: Payer: Self-pay | Admitting: Surgery

## 2022-07-29 ENCOUNTER — Other Ambulatory Visit: Payer: Self-pay | Admitting: Urology

## 2022-07-29 DIAGNOSIS — N401 Enlarged prostate with lower urinary tract symptoms: Secondary | ICD-10-CM

## 2022-09-18 ENCOUNTER — Encounter: Payer: Self-pay | Admitting: Ophthalmology

## 2022-09-19 NOTE — Anesthesia Preprocedure Evaluation (Addendum)
Anesthesia Evaluation  Patient identified by MRN, date of birth, ID band Patient awake    Reviewed: Allergy & Precautions, NPO status , Patient's Chart, lab work & pertinent test results  History of Anesthesia Complications Negative for: history of anesthetic complications  Airway Mallampati: IV   Neck ROM: Full    Dental  (+) Partial Lower, Partial Upper   Pulmonary former smoker (quit 1996)   Pulmonary exam normal breath sounds clear to auscultation       Cardiovascular Exercise Tolerance: Good negative cardio ROS Normal cardiovascular exam Rhythm:Regular Rate:Normal     Neuro/Psych negative neurological ROS     GI/Hepatic negative GI ROS,,,  Endo/Other  negative endocrine ROS    Renal/GU negative Renal ROS   BPH    Musculoskeletal  (+) Arthritis ,    Abdominal   Peds  Hematology negative hematology ROS (+)   Anesthesia Other Findings     Reproductive/Obstetrics                             Anesthesia Physical Anesthesia Plan  ASA: 2  Anesthesia Plan: MAC   Post-op Pain Management:    Induction: Intravenous  PONV Risk Score and Plan: 1 and Treatment may vary due to age or medical condition, Midazolam and TIVA  Airway Management Planned: Natural Airway and Nasal Cannula  Additional Equipment:   Intra-op Plan:   Post-operative Plan:   Informed Consent: I have reviewed the patients History and Physical, chart, labs and discussed the procedure including the risks, benefits and alternatives for the proposed anesthesia with the patient or authorized representative who has indicated his/her understanding and acceptance.     Dental advisory given  Plan Discussed with: CRNA  Anesthesia Plan Comments: (LMA/GETA backup discussed.  Patient consented for risks of anesthesia including but not limited to:  - adverse reactions to medications - damage to eyes, teeth, lips or  other oral mucosa - nerve damage due to positioning  - sore throat or hoarseness - damage to heart, brain, nerves, lungs, other parts of body or loss of life  Informed patient about role of CRNA in peri- and intra-operative care.  Patient voiced understanding.)       Anesthesia Quick Evaluation

## 2022-09-26 NOTE — Discharge Instructions (Signed)

## 2022-09-29 ENCOUNTER — Encounter
Admission: RE | Disposition: A | Discharge status: Home / Self Care | Payer: Self-pay | Source: Ambulatory Visit | Attending: Ophthalmology

## 2022-09-29 ENCOUNTER — Ambulatory Visit
Admission: RE | Admit: 2022-09-29 | Discharge: 2022-09-29 | Disposition: A | Discharge status: Home / Self Care | Payer: Medicare Other | Source: Ambulatory Visit | Attending: Ophthalmology | Admitting: Ophthalmology

## 2022-09-29 ENCOUNTER — Ambulatory Visit: Payer: Medicare Other | Admitting: Anesthesiology

## 2022-09-29 ENCOUNTER — Encounter: Payer: Self-pay | Admitting: Ophthalmology

## 2022-09-29 ENCOUNTER — Other Ambulatory Visit: Payer: Self-pay

## 2022-09-29 DIAGNOSIS — Z85828 Personal history of other malignant neoplasm of skin: Secondary | ICD-10-CM | POA: Diagnosis not present

## 2022-09-29 DIAGNOSIS — I10 Essential (primary) hypertension: Secondary | ICD-10-CM | POA: Diagnosis not present

## 2022-09-29 DIAGNOSIS — Z87891 Personal history of nicotine dependence: Secondary | ICD-10-CM | POA: Diagnosis not present

## 2022-09-29 DIAGNOSIS — M1990 Unspecified osteoarthritis, unspecified site: Secondary | ICD-10-CM | POA: Diagnosis not present

## 2022-09-29 DIAGNOSIS — H2511 Age-related nuclear cataract, right eye: Secondary | ICD-10-CM | POA: Insufficient documentation

## 2022-09-29 DIAGNOSIS — E7800 Pure hypercholesterolemia, unspecified: Secondary | ICD-10-CM | POA: Diagnosis not present

## 2022-09-29 HISTORY — PX: CATARACT EXTRACTION W/PHACO: SHX586

## 2022-09-29 HISTORY — DX: Presence of dental prosthetic device (complete) (partial): Z97.2

## 2022-09-29 SURGERY — PHACOEMULSIFICATION, CATARACT, WITH IOL INSERTION
Anesthesia: Monitor Anesthesia Care | Site: Eye | Laterality: Right

## 2022-09-29 MED ORDER — TETRACAINE HCL 0.5 % OP SOLN
1.0000 [drp] | OPHTHALMIC | Status: DC | PRN
  Administered 2022-09-29 (×3): 1 [drp] via OPHTHALMIC

## 2022-09-29 MED ORDER — SIGHTPATH DOSE#1 BSS IO SOLN
INTRAOCULAR | Status: DC | PRN
  Administered 2022-09-29: 15 mL

## 2022-09-29 MED ORDER — LACTATED RINGERS IV SOLN: INTRAVENOUS | Status: DC

## 2022-09-29 MED ORDER — SIGHTPATH DOSE#1 NA HYALUR & NA CHOND-NA HYALUR IO KIT
PACK | INTRAOCULAR | Status: DC | PRN
  Administered 2022-09-29: 1 via OPHTHALMIC

## 2022-09-29 MED ORDER — SIGHTPATH DOSE#1 BSS IO SOLN
INTRAOCULAR | Status: DC | PRN
  Administered 2022-09-29: 81 mL via OPHTHALMIC

## 2022-09-29 MED ORDER — MIDAZOLAM HCL 2 MG/2ML IJ SOLN
INTRAMUSCULAR | Status: DC | PRN
  Administered 2022-09-29: .5 mg via INTRAVENOUS
  Administered 2022-09-29: 1.5 mg via INTRAVENOUS

## 2022-09-29 MED ORDER — LIDOCAINE HCL (PF) 2 % IJ SOLN
INTRAOCULAR | Status: DC | PRN
  Administered 2022-09-29: 1 mL via INTRAOCULAR

## 2022-09-29 MED ORDER — MOXIFLOXACIN HCL 0.5 % OP SOLN
OPHTHALMIC | Status: DC | PRN
  Administered 2022-09-29: .2 mL via OPHTHALMIC

## 2022-09-29 MED ORDER — FENTANYL CITRATE (PF) 100 MCG/2ML IJ SOLN
INTRAMUSCULAR | Status: DC | PRN
  Administered 2022-09-29 (×2): 25 ug via INTRAVENOUS

## 2022-09-29 MED ORDER — ARMC OPHTHALMIC DILATING DROPS
1.0000 | OPHTHALMIC | Status: DC | PRN
  Administered 2022-09-29 (×3): 1 via OPHTHALMIC

## 2022-09-29 SURGICAL SUPPLY — 11 items
CATARACT SUITE SIGHTPATH (MISCELLANEOUS) ×1 IMPLANT
DISSECTOR HYDRO NUCLEUS 50X22 (MISCELLANEOUS) ×1 IMPLANT
FEE CATARACT SUITE SIGHTPATH (MISCELLANEOUS) ×1 IMPLANT
GLOVE SURG GAMMEX PI TX LF 7.5 (GLOVE) ×1 IMPLANT
GLOVE SURG SYN 8.5 E (GLOVE) ×1 IMPLANT
GLOVE SURG SYN 8.5 PF PI (GLOVE) ×1 IMPLANT
LENS IOL TECNIS EYHANCE 23.0 (Intraocular Lens) IMPLANT
NDL FILTER BLUNT 18X1 1/2 (NEEDLE) ×1 IMPLANT
NEEDLE FILTER BLUNT 18X1 1/2 (NEEDLE) ×1 IMPLANT
SYR 3ML LL SCALE MARK (SYRINGE) ×1 IMPLANT
SYR 5ML LL (SYRINGE) ×1 IMPLANT

## 2022-09-29 NOTE — Transfer of Care (Signed)
Immediate Anesthesia Transfer of Care Note  Patient: George Bruce  Procedure(s) Performed: CATARACT EXTRACTION PHACO AND INTRAOCULAR LENS PLACEMENT (IOC) RIGHT  10.98  00:56.5 (Right: Eye)  Patient Location: PACU  Anesthesia Type:MAC  Level of Consciousness: awake, alert , and oriented  Airway & Oxygen Therapy: Patient Spontanous Breathing  Post-op Assessment: Report given to RN and Post -op Vital signs reviewed and stable  Post vital signs: Reviewed and stable  Last Vitals: See PACU flow sheet for templ Vitals Value Taken Time  BP 146/97 09/29/22 1251  Temp    Pulse 59 09/29/22 1252  Resp 15 09/29/22 1252  SpO2 96 % 09/29/22 1252  Vitals shown include unfiled device data.  Last Pain:  Vitals:   09/29/22 1106  TempSrc: Temporal  PainSc: 4          Complications: No notable events documented.

## 2022-09-29 NOTE — H&P (Signed)
[facility]   Primary Care Physician:  Marguarite Arbour, MD Ophthalmologist: Dr. Willey Blade  Pre-Procedure History & Physical: HPI:  George Bruce is a 77 y.o. male here for cataract surgery.   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   Hypertension    Wears dentures    partial upper and lower    Past Surgical History:  Procedure Laterality Date   COLONOSCOPY WITH PROPOFOL N/A 04/21/2018   Procedure: COLONOSCOPY WITH PROPOFOL;  Surgeon: Pasty Spillers, MD;  Location: [facility] ENDOSCOPY;  Service: Endoscopy;  Laterality: N/A;   INGUINAL HERNIA REPAIR Left 1990's   INGUINAL HERNIA REPAIR Right 2015   REVERSE SHOULDER ARTHROPLASTY Left 11/06/2015   Procedure: REVERSE SHOULDER ARTHROPLASTY;  Surgeon: Christena Flake, MD;  Location: [facility] ORS;  Service: Orthopedics;  Laterality: Left;   REVERSE SHOULDER ARTHROPLASTY Right 03/25/2022   Procedure: REVERSE SHOULDER ARTHROPLASTY;  Surgeon: Christena Flake, MD;  Location: [facility] ORS;  Service: Orthopedics;  Laterality: Right;  MAKE 2ND CASE    Prior to Admission medications   Medication Sig Start Date End Date Taking? Authorizing Provider  finasteride (PROSCAR) 5 MG tablet TAKE 1 TABLET BY MOUTH DAILY. 03/04/22  Yes Stoioff, Verna Czech, MD  Multiple Vitamin (MULTIVITAMIN WITH MINERALS) TABS tablet Take 1 tablet by mouth daily.   Yes [provider]  naproxen (NAPROSYN) 500 MG tablet Take 500 mg by mouth 2 (two) times daily with a meal.   Yes [provider]  simvastatin (ZOCOR) 40 MG tablet Take 40 mg by mouth daily at 6 PM.   Yes [provider]  tamsulosin (FLOMAX) 0.4 MG CAPS capsule TAKE ONE CAPSULE DAILY 30 MINUTES AFTER THE SAME MEAL EACH DAY 07/29/22  Yes Stoioff, Verna Czech, MD   oxyCODONE (ROXICODONE) 5 MG immediate release tablet Take 1-2 tablets (5-10 mg total) by mouth every 4 (four) hours as needed for moderate pain or severe pain. Patient not taking: Reported on 09/18/2022 03/25/22   Poggi, Excell Seltzer, MD    Allergies as of 09/08/2022   (No Known Allergies)    Family History  Problem Relation Age of Onset   Diabetes Mother     Social History   Socioeconomic History   Marital status: Married    Spouse name: Mollie   Number of children: Not on file   Years of education: Not on file   Highest education level: Not on file  Occupational History   Not on file  Tobacco Use   Smoking status: Former    Current packs/day: 0.00    Types: Cigarettes    Quit date: 11/02/1994    Years since quitting: 27.9   Smokeless tobacco: Never  Vaping Use   Vaping status: Never Used  Substance and Sexual Activity   Alcohol use: Yes    Alcohol/week: 7.0 standard drinks of alcohol    Types: 7 Cans of beer per week    Comment: 1 beer per day   Drug use: No   Sexual activity: Yes  Other Topics Concern   Not on file  Social History Narrative   Not on file   Social Determinants of Health   Financial Resource Strain: Not on file  Food Insecurity: Not on file  Transportation Needs: Not on file  Physical Activity: Not on file  Stress: Not on file  Social Connections: Not on file  Intimate Partner Violence: Not on file    Review of Systems: See HPI, otherwise negative ROS  Physical Exam: BP (!) 155/31   Pulse 60   Temp 98 F (36.7 C) (Temporal)   Resp 14   Ht 5' 9.02" (1.753 m)   Wt 80.9 kg   SpO2 96%   BMI 26.32 kg/m  General:   Alert, cooperative in NAD Head:  Normocephalic and atraumatic. Respiratory:  Normal work of breathing. Cardiovascular:  RRR  Impression/Plan: George Bruce is here for cataract surgery.  Risks, benefits, limitations, and alternatives regarding cataract surgery have been reviewed with the patient.  Questions have been  answered.  All parties agreeable.   Willey Blade, MD  09/29/2022, 12:25 PM

## 2022-09-29 NOTE — Op Note (Signed)
OPERATIVE NOTE  George Bruce [id number] 09/29/2022   PREOPERATIVE DIAGNOSIS:  Nuclear sclerotic cataract right eye.  H25.11   POSTOPERATIVE DIAGNOSIS:    Nuclear sclerotic cataract right eye.     PROCEDURE:  Phacoemusification with posterior chamber intraocular lens placement of the right eye   LENS:   Implant Name Type Inv. Item Serial No. Manufacturer Lot No. LRB No. Used Action  LENS IOL TECNIS EYHANCE 23.0 - [id number] Intraocular Lens LENS IOL TECNIS EYHANCE 23.0 [id number] [employer]  Right 1 Implanted       Procedure(s): CATARACT EXTRACTION PHACO AND INTRAOCULAR LENS PLACEMENT (IOC) RIGHT  10.98  00:56.5 (Right)  DIB00 +23.0   ULTRASOUND TIME: 0 minutes 56 seconds.  CDE 10.98   SURGEON:  Willey Blade, MD, MPH  ANESTHESIOLOGIST: Anesthesiologist: Reed Breech, MD CRNA: Genia Del, CRNA   ANESTHESIA:  Topical with tetracaine drops augmented with 1% preservative-free intracameral lidocaine.  ESTIMATED BLOOD LOSS: less than 1 mL.   COMPLICATIONS:  None.   DESCRIPTION OF PROCEDURE:  The patient was identified in the holding room and transported to the operating room and placed in the supine position under the operating microscope.  The right eye was identified as the operative eye and it was prepped and draped in the usual sterile ophthalmic fashion.   A 1.0 millimeter clear-corneal paracentesis was made at the 10:30 position. 0.5 ml of preservative-free 1% lidocaine with epinephrine was injected into the anterior chamber.  The anterior chamber was filled with viscoelastic.  A 2.4 millimeter keratome was used to make a near-clear corneal incision at the 8:00 position.  A curvilinear capsulorrhexis was made with a cystotome and capsulorrhexis forceps.  Balanced salt solution was used to hydrodissect and hydrodelineate the nucleus.   Phacoemulsification was then used in stop and chop fashion to remove the lens nucleus and epinucleus.  The remaining cortex was then  removed using the irrigation and aspiration handpiece. Viscoelastic was then placed into the capsular bag to distend it for lens placement.  A lens was then injected into the capsular bag.  The remaining viscoelastic was aspirated.   Wounds were hydrated with balanced salt solution.  The anterior chamber was inflated to a physiologic pressure with balanced salt solution.   Intracameral vigamox 0.1 mL undiluted was injected into the eye and a drop placed onto the ocular surface.  No wound leaks were noted.  The patient was taken to the recovery room in stable condition without complications of anesthesia or surgery  Willey Blade 09/29/2022, 12:49 PM

## 2022-09-29 NOTE — Anesthesia Postprocedure Evaluation (Signed)
Anesthesia Post Note  Patient: George Bruce  Procedure(s) Performed: CATARACT EXTRACTION PHACO AND INTRAOCULAR LENS PLACEMENT (IOC) RIGHT  10.98  00:56.5 (Right: Eye)  Patient location during evaluation: PACU Anesthesia Type: MAC Level of consciousness: awake and alert, oriented and patient cooperative Pain management: pain level controlled Vital Signs Assessment: post-procedure vital signs reviewed and stable Respiratory status: spontaneous breathing, nonlabored ventilation and respiratory function stable Cardiovascular status: blood pressure returned to baseline and stable Postop Assessment: adequate PO intake Anesthetic complications: no   No notable events documented.   Last Vitals:  Vitals:   09/29/22 1250 09/29/22 1256  BP: (!) 146/97 (!) 140/94  Pulse: (!) 57 (!) 56  Resp: (!) 21 18  Temp: (!) 36.3 C (!) 36.3 C  SpO2: 97% 95%    Last Pain:  Vitals:   09/29/22 1250  TempSrc:   PainSc: 4                  Reed Breech

## 2022-09-30 ENCOUNTER — Encounter: Payer: Self-pay | Admitting: Ophthalmology

## 2022-10-07 ENCOUNTER — Encounter: Payer: Self-pay | Admitting: Ophthalmology

## 2022-10-08 NOTE — Discharge Instructions (Signed)

## 2022-10-13 ENCOUNTER — Encounter: Payer: Self-pay | Admitting: Ophthalmology

## 2022-10-13 ENCOUNTER — Other Ambulatory Visit: Payer: Self-pay

## 2022-10-13 ENCOUNTER — Ambulatory Visit
Admission: RE | Admit: 2022-10-13 | Discharge: 2022-10-13 | Disposition: A | Discharge status: Home / Self Care | Payer: Medicare Other | Source: Ambulatory Visit | Attending: Ophthalmology | Admitting: Ophthalmology

## 2022-10-13 ENCOUNTER — Encounter
Admission: RE | Disposition: A | Discharge status: Home / Self Care | Payer: Self-pay | Source: Ambulatory Visit | Attending: Ophthalmology

## 2022-10-13 ENCOUNTER — Ambulatory Visit: Payer: Medicare Other | Admitting: Anesthesiology

## 2022-10-13 DIAGNOSIS — Z87891 Personal history of nicotine dependence: Secondary | ICD-10-CM | POA: Diagnosis not present

## 2022-10-13 DIAGNOSIS — H2512 Age-related nuclear cataract, left eye: Secondary | ICD-10-CM | POA: Diagnosis present

## 2022-10-13 DIAGNOSIS — I10 Essential (primary) hypertension: Secondary | ICD-10-CM | POA: Insufficient documentation

## 2022-10-13 HISTORY — PX: CATARACT EXTRACTION W/PHACO: SHX586

## 2022-10-13 SURGERY — PHACOEMULSIFICATION, CATARACT, WITH IOL INSERTION
Anesthesia: Monitor Anesthesia Care | Laterality: Left

## 2022-10-13 MED ORDER — SIGHTPATH DOSE#1 BSS IO SOLN
INTRAOCULAR | Status: DC | PRN
  Administered 2022-10-13: 89 mL via OPHTHALMIC

## 2022-10-13 MED ORDER — SIGHTPATH DOSE#1 BSS IO SOLN
INTRAOCULAR | Status: DC | PRN
  Administered 2022-10-13: 15 mL via INTRAOCULAR

## 2022-10-13 MED ORDER — TETRACAINE HCL 0.5 % OP SOLN
1.0000 [drp] | OPHTHALMIC | Status: DC | PRN
  Administered 2022-10-13 (×3): 1 [drp] via OPHTHALMIC

## 2022-10-13 MED ORDER — MOXIFLOXACIN HCL 0.5 % OP SOLN
OPHTHALMIC | Status: DC | PRN
  Administered 2022-10-13: .2 mL via OPHTHALMIC

## 2022-10-13 MED ORDER — LIDOCAINE HCL (PF) 2 % IJ SOLN
INTRAOCULAR | Status: DC | PRN
  Administered 2022-10-13: 1 mL via INTRAOCULAR

## 2022-10-13 MED ORDER — MIDAZOLAM HCL 2 MG/2ML IJ SOLN
INTRAMUSCULAR | Status: DC | PRN
  Administered 2022-10-13: 1 mg via INTRAVENOUS

## 2022-10-13 MED ORDER — SIGHTPATH DOSE#1 NA HYALUR & NA CHOND-NA HYALUR IO KIT
PACK | INTRAOCULAR | Status: DC | PRN
  Administered 2022-10-13: 1 via OPHTHALMIC

## 2022-10-13 MED ORDER — ARMC OPHTHALMIC DILATING DROPS
1.0000 | OPHTHALMIC | Status: DC | PRN
  Administered 2022-10-13 (×3): 1 via OPHTHALMIC

## 2022-10-13 MED ORDER — LACTATED RINGERS IV SOLN: INTRAVENOUS | Status: DC

## 2022-10-13 MED ORDER — FENTANYL CITRATE (PF) 100 MCG/2ML IJ SOLN
INTRAMUSCULAR | Status: DC | PRN
  Administered 2022-10-13: 50 ug via INTRAVENOUS

## 2022-10-13 SURGICAL SUPPLY — 11 items
CATARACT SUITE SIGHTPATH (MISCELLANEOUS) ×1
DISSECTOR HYDRO NUCLEUS 50X22 (MISCELLANEOUS) ×1 IMPLANT
FEE CATARACT SUITE SIGHTPATH (MISCELLANEOUS) ×1 IMPLANT
GLOVE SURG GAMMEX PI TX LF 7.5 (GLOVE) ×1 IMPLANT
GLOVE SURG SYN 8.5 E (GLOVE) ×1
GLOVE SURG SYN 8.5 PF PI (GLOVE) ×1 IMPLANT
LENS IOL TECNIS EYHANCE 22.5 (Intraocular Lens) IMPLANT
NDL FILTER BLUNT 18X1 1/2 (NEEDLE) ×1 IMPLANT
NEEDLE FILTER BLUNT 18X1 1/2 (NEEDLE) ×1
SYR 3ML LL SCALE MARK (SYRINGE) ×1 IMPLANT
SYR 5ML LL (SYRINGE) ×1 IMPLANT

## 2022-10-13 NOTE — Op Note (Signed)
OPERATIVE NOTE  George Bruce [id number] 10/13/2022   PREOPERATIVE DIAGNOSIS:  Nuclear sclerotic cataract left eye.  H25.12   POSTOPERATIVE DIAGNOSIS:    Nuclear sclerotic cataract left eye.     PROCEDURE:  Phacoemusification with posterior chamber intraocular lens placement of the left eye   LENS:   Implant Name Type Inv. Item Serial No. Manufacturer Lot No. LRB No. Used Action  LENS IOL TECNIS EYHANCE 22.5 - [id number] Intraocular Lens LENS IOL TECNIS EYHANCE 22.5 [id number] [employer]  Left 1 Implanted      Procedure(s): CATARACT EXTRACTION PHACO AND INTRAOCULAR LENS PLACEMENT (IOC) LEFT 10.79 00:54.1 (Left)  DIB00 +22.5   ULTRASOUND TIME: 0 minutes 54 seconds.  CDE 10.79   SURGEON:  Willey Blade, MD, MPH   ANESTHESIA:  Topical with tetracaine drops augmented with 1% preservative-free intracameral lidocaine.  ESTIMATED BLOOD LOSS: <1 mL   COMPLICATIONS:  None.   DESCRIPTION OF PROCEDURE:  The patient was identified in the holding room and transported to the operating room and placed in the supine position under the operating microscope.  The left eye was identified as the operative eye and it was prepped and draped in the usual sterile ophthalmic fashion.   A 1.0 millimeter clear-corneal paracentesis was made at the 5:00 position. 0.5 ml of preservative-free 1% lidocaine with epinephrine was injected into the anterior chamber.  The anterior chamber was filled with viscoelastic.  A 2.4 millimeter keratome was used to make a near-clear corneal incision at the 2:00 position.  A curvilinear capsulorrhexis was made with a cystotome and capsulorrhexis forceps.  Balanced salt solution was used to hydrodissect and hydrodelineate the nucleus.   Phacoemulsification was then used in stop and chop fashion to remove the lens nucleus and epinucleus.  The remaining cortex was then removed using the irrigation and aspiration handpiece. Viscoelastic was then placed into the capsular bag to  distend it for lens placement.  A lens was then injected into the capsular bag.  The remaining viscoelastic was aspirated.   Wounds were hydrated with balanced salt solution.  The anterior chamber was inflated to a physiologic pressure with balanced salt solution.  Intracameral vigamox 0.1 mL undiltued was injected into the eye and a drop placed onto the ocular surface.  No wound leaks were noted.  The patient was taken to the recovery room in stable condition without complications of anesthesia or surgery  Willey Blade 10/13/2022, 8:22 AM

## 2022-10-13 NOTE — H&P (Signed)
[facility]   Primary Care Physician:  Marguarite Arbour, MD Ophthalmologist: Dr. Willey Blade  Pre-Procedure History & Physical: HPI:  George Bruce is a 77 y.o. male here for cataract surgery.   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   Hypertension    Wears dentures    partial upper and lower    Past Surgical History:  Procedure Laterality Date   CATARACT EXTRACTION W/PHACO Right 09/29/2022   Procedure: CATARACT EXTRACTION PHACO AND INTRAOCULAR LENS PLACEMENT (IOC) RIGHT  10.98  00:56.5;  Surgeon: Nevada Crane, MD;  Location: [facility];  Service: Ophthalmology;  Laterality: Right;   COLONOSCOPY WITH PROPOFOL N/A 04/21/2018   Procedure: COLONOSCOPY WITH PROPOFOL;  Surgeon: Pasty Spillers, MD;  Location: [facility];  Service: Endoscopy;  Laterality: N/A;   INGUINAL HERNIA REPAIR Left 1990's   INGUINAL HERNIA REPAIR Right 2015   REVERSE SHOULDER ARTHROPLASTY Left 11/06/2015   Procedure: REVERSE SHOULDER ARTHROPLASTY;  Surgeon: Christena Flake, MD;  Location: [facility];  Service: Orthopedics;  Laterality: Left;   REVERSE SHOULDER ARTHROPLASTY Right 03/25/2022   Procedure: REVERSE SHOULDER ARTHROPLASTY;  Surgeon: Christena Flake, MD;  Location: [facility];  Service: Orthopedics;  Laterality: Right;  MAKE 2ND CASE    Prior to Admission medications   Medication Sig Start Date End Date Taking? Authorizing Provider  finasteride (PROSCAR) 5 MG tablet TAKE 1 TABLET BY MOUTH DAILY. 03/04/22  Yes Stoioff, Verna Czech, MD  Multiple Vitamin (MULTIVITAMIN WITH MINERALS) TABS tablet Take 1 tablet by mouth daily.   Yes [provider]  naproxen (NAPROSYN) 500 MG tablet Take 500 mg by mouth 2 (two) times daily with a meal.   Yes [provider]  simvastatin (ZOCOR) 40 MG tablet Take 40 mg by mouth daily at 6 PM.   Yes [provider]  tamsulosin (FLOMAX) 0.4 MG CAPS capsule TAKE ONE CAPSULE DAILY 30 MINUTES AFTER THE SAME MEAL EACH DAY 07/29/22  Yes Stoioff, Verna Czech, MD    Allergies as of 09/08/2022   (No Known Allergies)    Family History  Problem Relation Age of Onset   Diabetes Mother     Social History   Socioeconomic History   Marital status: Married    Spouse name: Mollie   Number of children: Not on file   Years of education: Not on file   Highest education level: Not on file  Occupational History   Not on file  Tobacco Use   Smoking status: Former    Current packs/day: 0.00    Types: Cigarettes    Quit date: 11/02/1994    Years since quitting: 27.9   Smokeless tobacco: Never  Vaping Use   Vaping status: Never Used  Substance and Sexual Activity   Alcohol use: Yes    Alcohol/week: 7.0 standard drinks of alcohol    Types: 7 Cans of beer per week    Comment: 1 beer per day   Drug use: No   Sexual activity: Yes  Other Topics Concern   Not on file  Social History Narrative   Not on file   Social Determinants of Health   Financial Resource Strain: Not on file  Food Insecurity: Not on file  Transportation Needs: Not on file  Physical Activity: Not on file  Stress: Not on file  Social Connections: Not on file  Intimate Partner Violence: Not on file    Review of Systems: See HPI, otherwise negative ROS  Physical Exam: BP 135/77   Temp 98.3 F (36.8 C) (Temporal)   Ht 5' 9.02" (1.753 m)   Wt 75.7 kg   SpO2 97%   BMI 24.62 kg/m  General:   Alert, cooperative in NAD Head:  Normocephalic and atraumatic. Respiratory:  Normal work of breathing. Cardiovascular:  RRR  Impression/Plan: [name] is here for cataract surgery.  Risks, benefits, limitations, and alternatives regarding cataract surgery have been reviewed with the patient.  Questions have been  answered.  All parties agreeable.   Willey Blade, MD  10/13/2022, 7:56 AM

## 2022-10-13 NOTE — Anesthesia Preprocedure Evaluation (Signed)
Anesthesia Evaluation  Patient identified by MRN, date of birth, ID band Patient awake    Reviewed: Allergy & Precautions, NPO status , Patient's Chart, lab work & pertinent test results  History of Anesthesia Complications Negative for: history of anesthetic complications  Airway Mallampati: III  TM Distance: >3 FB Neck ROM: full    Dental  (+) Chipped   Pulmonary neg shortness of breath, former smoker   Pulmonary exam normal        Cardiovascular Exercise Tolerance: Good hypertension, (-) angina Normal cardiovascular exam     Neuro/Psych negative neurological ROS  negative psych ROS   GI/Hepatic negative GI ROS, Neg liver ROS,neg GERD  ,,  Endo/Other  negative endocrine ROS    Renal/GU      Musculoskeletal   Abdominal   Peds  Hematology negative hematology ROS (+)   Anesthesia Other Findings Past Medical History: No date: Arthritis     Comment:  hips and lower back No date: Diverticulitis 1995: Elevated cholesterol No date: Enlarged prostate 12/21/2014: Hx of basal cell carcinoma     Comment:  multiple sites  01/03/2016: Hx of squamous cell carcinoma     Comment:  Left mid med scapula No date: Hypertension No date: Wears dentures     Comment:  partial upper and lower  Past Surgical History: 09/29/2022: CATARACT EXTRACTION W/PHACO; Right     Comment:  Procedure: CATARACT EXTRACTION PHACO AND INTRAOCULAR               LENS PLACEMENT (IOC) RIGHT  10.98  00:56.5;  Surgeon:               Nevada Crane, MD;  Location: [facility];                Service: Ophthalmology;  Laterality: Right; 04/21/2018: COLONOSCOPY WITH PROPOFOL; N/A     Comment:  Procedure: COLONOSCOPY WITH PROPOFOL;  Surgeon:               Pasty Spillers, MD;  Location: [facility];                Service: Endoscopy;  Laterality: N/A; 1990's: INGUINAL HERNIA REPAIR; Left 2015: INGUINAL HERNIA REPAIR; Right 11/06/2015:  REVERSE SHOULDER ARTHROPLASTY; Left     Comment:  Procedure: REVERSE SHOULDER ARTHROPLASTY;  Surgeon: Christena Flake, MD;  Location: [facility];  Service: Orthopedics;               Laterality: Left; 03/25/2022: REVERSE SHOULDER ARTHROPLASTY; Right     Comment:  Procedure: REVERSE SHOULDER ARTHROPLASTY;  Surgeon:               Christena Flake, MD;  Location: [facility];  Service:               Orthopedics;  Laterality: Right;  MAKE 2ND CASE  BMI    Body Mass Index: 24.62 kg/m      Reproductive/Obstetrics negative OB ROS                             Anesthesia Physical Anesthesia Plan  ASA: 2  Anesthesia Plan: MAC   Post-op Pain Management:    Induction: Intravenous  PONV Risk Score and Plan:   Airway Management Planned: Natural Airway and Nasal Cannula  Additional Equipment:   Intra-op Plan:   Post-operative Plan:   Informed  Consent: I have reviewed the patients History and Physical, chart, labs and discussed the procedure including the risks, benefits and alternatives for the proposed anesthesia with the patient or authorized representative who has indicated his/her understanding and acceptance.     Dental Advisory Given  Plan Discussed with: Anesthesiologist, CRNA and Surgeon  Anesthesia Plan Comments: (Patient consented for risks of anesthesia including but not limited to:  - adverse reactions to medications - damage to eyes, teeth, lips or other oral mucosa - nerve damage due to positioning  - sore throat or hoarseness - Damage to heart, brain, nerves, lungs, other parts of body or loss of life  Patient voiced understanding.)       Anesthesia Quick Evaluation

## 2022-10-13 NOTE — Anesthesia Postprocedure Evaluation (Signed)
Anesthesia Post Note  Patient: George Bruce  Procedure(s) Performed: CATARACT EXTRACTION PHACO AND INTRAOCULAR LENS PLACEMENT (IOC) LEFT 10.79 00:54.1 (Left)  Patient location during evaluation: PACU Anesthesia Type: MAC Level of consciousness: awake and alert Pain management: pain level controlled Vital Signs Assessment: post-procedure vital signs reviewed and stable Respiratory status: spontaneous breathing, nonlabored ventilation, respiratory function stable and patient connected to nasal cannula oxygen Cardiovascular status: blood pressure returned to baseline and stable Postop Assessment: no apparent nausea or vomiting Anesthetic complications: no   No notable events documented.   Last Vitals:  Vitals:   10/13/22 0823 10/13/22 0827  BP: (!) 142/85 (!) 150/78  Pulse: (!) 57 61  Resp: 11 17  Temp: (!) 36.4 C (!) 36.4 C  SpO2: 98% 96%    Last Pain:  Vitals:   10/13/22 0827  TempSrc:   PainSc: 0-No pain                 Cleda Mccreedy Franchot Pollitt

## 2022-10-13 NOTE — Transfer of Care (Signed)
Immediate Anesthesia Transfer of Care Note  Patient: George Bruce  Procedure(s) Performed: CATARACT EXTRACTION PHACO AND INTRAOCULAR LENS PLACEMENT (IOC) LEFT 10.79 00:54.1 (Left)  Patient Location: PACU  Anesthesia Type: MAC  Level of Consciousness: awake, alert  and patient cooperative  Airway and Oxygen Therapy: Patient Spontanous Breathing and Patient connected to supplemental oxygen  Post-op Assessment: Post-op Vital signs reviewed, Patient's Cardiovascular Status Stable, Respiratory Function Stable, Patent Airway and No signs of Nausea or vomiting  Post-op Vital Signs: Reviewed and stable  Complications: No notable events documented.

## 2022-10-14 ENCOUNTER — Encounter: Payer: Self-pay | Admitting: Ophthalmology

## 2023-01-29 ENCOUNTER — Ambulatory Visit: Payer: Medicare Other | Admitting: Dermatology

## 2023-01-29 DIAGNOSIS — L570 Actinic keratosis: Secondary | ICD-10-CM | POA: Diagnosis not present

## 2023-01-29 DIAGNOSIS — D1801 Hemangioma of skin and subcutaneous tissue: Secondary | ICD-10-CM

## 2023-01-29 DIAGNOSIS — D692 Other nonthrombocytopenic purpura: Secondary | ICD-10-CM

## 2023-01-29 DIAGNOSIS — L814 Other melanin hyperpigmentation: Secondary | ICD-10-CM

## 2023-01-29 DIAGNOSIS — L578 Other skin changes due to chronic exposure to nonionizing radiation: Secondary | ICD-10-CM | POA: Diagnosis not present

## 2023-01-29 DIAGNOSIS — Z1283 Encounter for screening for malignant neoplasm of skin: Secondary | ICD-10-CM | POA: Diagnosis not present

## 2023-01-29 DIAGNOSIS — L821 Other seborrheic keratosis: Secondary | ICD-10-CM

## 2023-01-29 DIAGNOSIS — L720 Epidermal cyst: Secondary | ICD-10-CM

## 2023-01-29 DIAGNOSIS — W908XXA Exposure to other nonionizing radiation, initial encounter: Secondary | ICD-10-CM | POA: Diagnosis not present

## 2023-01-29 DIAGNOSIS — Z8589 Personal history of malignant neoplasm of other organs and systems: Secondary | ICD-10-CM

## 2023-01-29 DIAGNOSIS — L729 Follicular cyst of the skin and subcutaneous tissue, unspecified: Secondary | ICD-10-CM

## 2023-01-29 DIAGNOSIS — Z85828 Personal history of other malignant neoplasm of skin: Secondary | ICD-10-CM

## 2023-01-29 DIAGNOSIS — D229 Melanocytic nevi, unspecified: Secondary | ICD-10-CM

## 2023-01-29 NOTE — Progress Notes (Signed)
Follow-Up Visit   Subjective  George Bruce is a 77 y.o. male who presents for the following: Skin Cancer Screening and Full Body Skin Exam  The patient presents for Total-Body Skin Exam (TBSE) for skin cancer screening and mole check. The patient has spots, moles and lesions to be evaluated, some may be new or changing and the patient may have concern these could be cancer.  The following portions of the chart were reviewed this encounter and updated as appropriate: medications, allergies, medical history  Review of Systems:  No other skin or systemic complaints except as noted in HPI or Assessment and Plan.  Objective  Well appearing patient in no apparent distress; mood and affect are within normal limits.  A full examination was performed including scalp, head, eyes, ears, nose, lips, neck, chest, axillae, abdomen, back, buttocks, bilateral upper extremities, bilateral lower extremities, hands, feet, fingers, toes, fingernails, and toenails. All findings within normal limits unless otherwise noted below.   Relevant physical exam findings are noted in the Assessment and Plan.  Face x 4 (4) Erythematous thin papules/macules with gritty scale.     Assessment & Plan   SKIN CANCER SCREENING PERFORMED TODAY.  ACTINIC DAMAGE - Chronic condition, secondary to cumulative UV/sun exposure - diffuse scaly erythematous macules with underlying dyspigmentation - Recommend daily broad spectrum sunscreen SPF 30+ to sun-exposed areas, reapply every 2 hours as needed.  - Staying in the shade or wearing long sleeves, sun glasses (UVA+UVB protection) and wide brim hats (4-inch brim around the entire circumference of the hat) are also recommended for sun protection.  - Call for new or changing lesions.  LENTIGINES, SEBORRHEIC KERATOSES, HEMANGIOMAS - Benign normal skin lesions - Benign-appearing - Call for any changes  MELANOCYTIC NEVI - Tan-brown and/or pink-flesh-colored symmetric  macules and papules - Benign appearing on exam today - Observation - Call clinic for new or changing moles - Recommend daily use of broad spectrum spf 30+ sunscreen to sun-exposed areas.   Purpura - Chronic; persistent and recurrent.  Treatable, but not curable. - Violaceous macules and patches - Benign - Related to trauma, age, sun damage and/or use of blood thinners, chronic use of topical and/or oral steroids - Observe - Can use OTC arnica containing moisturizer such as Dermend Bruise Formula if desired - Call for worsening or other concerns  HISTORY OF SQUAMOUS CELL CARCINOMA OF THE SKIN - No evidence of recurrence today - No lymphadenopathy - Recommend regular full body skin exams - Recommend daily broad spectrum sunscreen SPF 30+ to sun-exposed areas, reapply every 2 hours as needed.  - Call if any new or changing lesions are noted between office visits  HISTORY OF BASAL CELL CARCINOMA OF THE SKIN - No evidence of recurrence today - Recommend regular full body skin exams - Recommend daily broad spectrum sunscreen SPF 30+ to sun-exposed areas, reapply every 2 hours as needed.  - Call if any new or changing lesions are noted between office visits  AK (actinic keratosis) (4) Face x 4  Actinic keratoses are precancerous spots that appear secondary to cumulative UV radiation exposure/sun exposure over time. They are chronic with expected duration over 1 year. A portion of actinic keratoses will progress to squamous cell carcinoma of the skin. It is not possible to reliably predict which spots will progress to skin cancer and so treatment is recommended to prevent development of skin cancer.  Recommend daily broad spectrum sunscreen SPF 30+ to sun-exposed areas, reapply every 2 hours as  needed.  Recommend staying in the shade or wearing long sleeves, sun glasses (UVA+UVB protection) and wide brim hats (4-inch brim around the entire circumference of the hat). Call for new or changing  lesions.   Destruction of lesion - Face x 4 (4) Complexity: simple   Destruction method: cryotherapy   Informed consent: discussed and consent obtained   Timeout:  patient name, date of birth, surgical site, and procedure verified Lesion destroyed using liquid nitrogen: Yes   Region frozen until ice ball extended beyond lesion: Yes   Outcome: patient tolerated procedure well with no complications   Post-procedure details: wound care instructions given    EPIDERMAL INCLUSION CYST Exam: Subcutaneous nodule at the back  Benign-appearing. Exam most consistent with an epidermal inclusion cyst. Discussed that a cyst is a benign growth that can grow over time and sometimes get irritated or inflamed. Recommend observation if it is not bothersome. Discussed option of surgical excision to remove it if it is growing, symptomatic, or other changes noted. Please call for new or changing lesions so they can be evaluated.  Return in about 1 year (around 01/29/2024) for TBSE.  Maylene Roes, CMA, am acting as scribe for Armida Sans, MD .   Documentation: I have reviewed the above documentation for accuracy and completeness, and I agree with the above.  Armida Sans, MD

## 2023-01-29 NOTE — Patient Instructions (Signed)

## 2023-02-03 ENCOUNTER — Encounter: Payer: Self-pay | Admitting: Dermatology

## 2023-03-05 ENCOUNTER — Other Ambulatory Visit: Payer: Self-pay | Admitting: Urology

## 2023-03-05 DIAGNOSIS — N401 Enlarged prostate with lower urinary tract symptoms: Secondary | ICD-10-CM

## 2023-03-20 ENCOUNTER — Ambulatory Visit (INDEPENDENT_AMBULATORY_CARE_PROVIDER_SITE_OTHER): Payer: Medicare Other | Admitting: Urology

## 2023-03-20 ENCOUNTER — Encounter: Payer: Self-pay | Admitting: Urology

## 2023-03-20 VITALS — BP 130/72 | HR 74 | Ht 69.0 in | Wt 172.0 lb

## 2023-03-20 DIAGNOSIS — R339 Retention of urine, unspecified: Secondary | ICD-10-CM

## 2023-03-20 DIAGNOSIS — N401 Enlarged prostate with lower urinary tract symptoms: Secondary | ICD-10-CM

## 2023-03-20 LAB — BLADDER SCAN AMB NON-IMAGING: Scan Result: 370

## 2023-03-20 NOTE — Progress Notes (Signed)
 I, Maysun LITTIE Griffiths, acting as a scribe for Glendia JAYSON Barba, MD., have documented all relevant documentation on the behalf of Glendia JAYSON Barba, MD, as directed by Glendia JAYSON Barba, MD while in the presence of Glendia JAYSON Barba, MD.  03/20/2023 10:44 AM   Marinda DELENA Saba February 27, 1946 [id number]  Referring provider: Auston Reyes BIRCH, MD [address] [facility] [place],  [place] [address]  Chief Complaint  Patient presents with   Benign Prostatic Hypertrophy   Urologic history: 1.  Elevated PSA Prostate biopsy 03/2016; PSA 4.1; prostate volume 73 cc; benign pathology MRI prostate 06/2016 PSA 6.16; no suspicious lesions   2.  BPH with lower urinary tract symptoms Finasteride /tamsulosin  Incomplete emptying; PVR ~200 mL   3.  Erectile dysfunction Generic sildenafil   HPI: George Bruce is a 78 y.o. male presents for annual follow-up.   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;  Service: Ophthalmology;  Laterality: Right;   CATARACT EXTRACTION W/PHACO Left 10/13/2022   Procedure: CATARACT EXTRACTION PHACO AND INTRAOCULAR LENS PLACEMENT (IOC) LEFT 10.79 00:54.1;  Surgeon: Myrna Adine Anes, MD;  Location: [facility];  Service: Ophthalmology;  Laterality: Left;   COLONOSCOPY WITH PROPOFOL  N/A 04/21/2018   Procedure: COLONOSCOPY WITH PROPOFOL ;  Surgeon: Janalyn Keene NOVAK, MD;  Location: [facility];  Service: Endoscopy;  Laterality: N/A;   INGUINAL HERNIA REPAIR Left 1990's   INGUINAL HERNIA REPAIR Right 2015   REVERSE SHOULDER ARTHROPLASTY Left 11/06/2015   Procedure: REVERSE SHOULDER ARTHROPLASTY;  Surgeon: Norleen JINNY Maltos, MD;  Location: [facility];  Service: Orthopedics;  Laterality: Left;   REVERSE SHOULDER ARTHROPLASTY Right 03/25/2022   Procedure: REVERSE SHOULDER ARTHROPLASTY;  Surgeon: Maltos Norleen JINNY, MD;  Location: [facility];  Service: Orthopedics;  Laterality: Right;  MAKE 2ND CASE    Home Medications:  Allergies as of 03/20/2023       Reactions   Iodine Rash   topical        Medication List        Accurate as of March 20, 2023 10:44 AM. If you have any questions, ask your nurse or doctor.          finasteride  5 MG tablet Commonly known as: PROSCAR  TAKE ONE TABLET (5 MG) BY MOUTH EVERY DAY   multivitamin with minerals Tabs tablet Take 1 tablet by mouth daily.   naproxen 500 MG tablet Commonly known as: NAPROSYN Take 500 mg by mouth 2 (two) times daily with a meal.   sildenafil  20 MG tablet Commonly known as: REVATIO  TAKE 3 TO 5 TABLETS EVERY DAY AS NEEDED   simvastatin  40 MG tablet Commonly known as: ZOCOR  Take 40  mg by mouth daily at 6 PM.   tamsulosin  0.4 MG Caps capsule Commonly known as: FLOMAX  TAKE ONE CAPSULE DAILY 30 MINUTES AFTER THE SAME MEAL EACH DAY        Allergies:  Allergies  Allergen Reactions   Iodine Rash    topical    Family History: Family History  Problem Relation Age of Onset   Diabetes Mother     Social History:  reports that he quit smoking about 28 years ago. His smoking use included cigarettes. He has never used smokeless tobacco. He reports current alcohol use of about 7.0 standard drinks of alcohol per week. He reports  that he does not use drugs.   Physical Exam: BP 130/72   Pulse 74   Ht 5' 9 (1.753 m)   Wt 172 lb (78 kg)   BMI 25.40 kg/m   Constitutional:  Alert and oriented, No acute distress. HEENT: [place] AT Respiratory: Normal respiratory effort, no increased work of breathing. Psychiatric: Normal mood and affect.   Assessment & Plan:    1. Benign prostatic hyperplasia with incomplete bladder emptying PVR today was 370 mL. He states he was not sure if he was going to need to give a urine sample today and was holding his urine and did not feel he emptied completely. Voided additionally and repeat PVR was 270 mL Continue tamsulosin /finasteride  Continue annual follow-up with PVR.   I have reviewed the above documentation for accuracy and completeness, and I agree with the above.   Glendia JAYSON Barba, MD  [facility] [address], [address] [place], [place] [address] [phone]

## 2023-05-05 ENCOUNTER — Emergency Department: Payer: Medicare HMO

## 2023-05-05 ENCOUNTER — Other Ambulatory Visit: Payer: Self-pay

## 2023-05-05 ENCOUNTER — Emergency Department
Admission: EM | Admit: 2023-05-05 | Discharge: 2023-05-05 | Disposition: A | Discharge status: Home / Self Care | Payer: Medicare HMO | Attending: Emergency Medicine | Admitting: Emergency Medicine

## 2023-05-05 DIAGNOSIS — S81831A Puncture wound without foreign body, right lower leg, initial encounter: Secondary | ICD-10-CM | POA: Insufficient documentation

## 2023-05-05 DIAGNOSIS — Z23 Encounter for immunization: Secondary | ICD-10-CM | POA: Insufficient documentation

## 2023-05-05 DIAGNOSIS — W540XXA Bitten by dog, initial encounter: Secondary | ICD-10-CM | POA: Diagnosis not present

## 2023-05-05 DIAGNOSIS — S81851A Open bite, right lower leg, initial encounter: Secondary | ICD-10-CM

## 2023-05-05 DIAGNOSIS — S61431A Puncture wound without foreign body of right hand, initial encounter: Secondary | ICD-10-CM | POA: Diagnosis not present

## 2023-05-05 DIAGNOSIS — S6991XA Unspecified injury of right wrist, hand and finger(s), initial encounter: Secondary | ICD-10-CM | POA: Diagnosis present

## 2023-05-05 MED ORDER — LIDOCAINE-EPINEPHRINE 1 %-1:100000 IJ SOLN
30.0000 mL | Freq: Once | INTRAMUSCULAR | Status: AC
  Administered 2023-05-05: 30 mL
  Filled 2023-05-05: qty 2

## 2023-05-05 MED ORDER — AMOXICILLIN-POT CLAVULANATE 875-125 MG PO TABS
1.0000 | ORAL_TABLET | Freq: Once | ORAL | Status: AC
  Administered 2023-05-05: 1 via ORAL
  Filled 2023-05-05: qty 1

## 2023-05-05 MED ORDER — HYDROCODONE-ACETAMINOPHEN 5-325 MG PO TABS
1.0000 | ORAL_TABLET | ORAL | Status: AC
  Administered 2023-05-05: 1 via ORAL
  Filled 2023-05-05: qty 1

## 2023-05-05 MED ORDER — HYDROCODONE-ACETAMINOPHEN 5-325 MG PO TABS: 1.0000 | ORAL_TABLET | Freq: Four times a day (QID) | ORAL | 0 refills | Status: DC | PRN

## 2023-05-05 MED ORDER — TETANUS-DIPHTHERIA TOXOIDS TD 5-2 LFU IM INJ: 0.5000 mL | INJECTION | Freq: Once | INTRAMUSCULAR | Status: DC

## 2023-05-05 MED ORDER — AMOXICILLIN-POT CLAVULANATE 875-125 MG PO TABS: 1.0000 | ORAL_TABLET | Freq: Two times a day (BID) | ORAL | 0 refills | Status: AC

## 2023-05-05 MED ORDER — TETANUS-DIPHTH-ACELL PERTUSSIS 5-2.5-18.5 LF-MCG/0.5 IM SUSY
0.5000 mL | PREFILLED_SYRINGE | Freq: Once | INTRAMUSCULAR | Status: AC
  Administered 2023-05-05: 0.5 mL via INTRAMUSCULAR
  Filled 2023-05-05: qty 0.5

## 2023-05-05 MED ORDER — AMOXICILLIN-POT CLAVULANATE 875-125 MG PO TABS: 1.0000 | ORAL_TABLET | Freq: Two times a day (BID) | ORAL | 0 refills | Status: DC

## 2023-05-05 NOTE — ED Triage Notes (Signed)
 Patient states dog bite to right hand and right lower leg; reports this is his own personal dog and he is currently up to date on all immunizations.

## 2023-05-05 NOTE — Discharge Instructions (Addendum)
 Please continue with bulky dressings for 2 days then you can remove dressings and change daily.  You may cleanse daily with alcohol or half-strength peroxide.  You may shower and get wet but do not submerge underwater.  Use bulky dressings as long as there is any drainage.  Return to the ER for any fevers increasing pain swelling warmth redness or any urgent changes in health follow-up in 10 to 14-day for suture removal

## 2023-05-05 NOTE — ED Provider Notes (Signed)
 [place] EMERGENCY DEPARTMENT AT [facility] Provider Note   CSN: [id number] Arrival date & time: 05/05/23  1726     History  Chief Complaint  Patient presents with   Animal Bite    George Bruce is a 78 y.o. male.  Presents to the emergency department for evaluation of dog bite to the right hand and right calf.  Bite occurred just prior to arrival.  Patient was bitten by his own dog.  He was attempting to feed the dog and went to pick the food up off of the floor when he was bitten.  He states his dog's vaccinations are all up-to-date.  Patient uncertain of his own tetanus status.  His pain is mild to moderate.  He has been to the right fifth digit metacarpal region as well as the right calf.  HPI     Home Medications Prior to Admission medications   Medication Sig Start Date End Date Taking? Authorizing Provider  amoxicillin-clavulanate (AUGMENTIN) 875-125 MG tablet Take 1 tablet by mouth 2 (two) times daily for 10 days. 05/05/23 05/15/23 Yes Evon Slack, PA-C  HYDROcodone-acetaminophen (NORCO/VICODIN) 5-325 MG tablet Take 1 tablet by mouth every 6 (six) hours as needed for moderate pain (pain score 4-6). 05/05/23  Yes Amador Cunas C, PA-C  finasteride (PROSCAR) 5 MG tablet TAKE ONE TABLET (5 MG) BY MOUTH EVERY DAY 03/05/23   Stoioff, Verna Czech, MD  Multiple Vitamin (MULTIVITAMIN WITH MINERALS) TABS tablet Take 1 tablet by mouth daily.    [provider]  naproxen (NAPROSYN) 500 MG tablet Take 500 mg by mouth 2 (two) times daily with a meal.    [provider]  sildenafil (REVATIO) 20 MG tablet TAKE 3 TO 5 TABLETS EVERY DAY AS NEEDED 01/15/21   [provider]  simvastatin (ZOCOR) 40 MG tablet Take 40 mg by mouth daily at 6 PM.    [provider]  tamsulosin (FLOMAX) 0.4 MG CAPS capsule TAKE ONE CAPSULE DAILY 30 MINUTES AFTER THE SAME MEAL EACH DAY 07/29/22   Stoioff, Verna Czech, MD      Allergies    Iodine    Review of Systems    Review of Systems  Physical Exam Updated Vital Signs BP (!) 105/93   Pulse 77   Temp 98.3 F (36.8 C) (Oral)   Resp 18   Ht 5\' 9"  (1.753 m)   Wt 82.1 kg   SpO2 95%   BMI 26.73 kg/m  Physical Exam Constitutional:      Appearance: He is well-developed.  HENT:     Head: Normocephalic and atraumatic.  Eyes:     Conjunctiva/sclera: Conjunctivae normal.  Cardiovascular:     Rate and Rhythm: Normal rate.  Pulmonary:     Effort: Pulmonary effort is normal. No respiratory distress.  Musculoskeletal:        General: Normal range of motion.     Cervical back: Normal range of motion.     Comments: Patient with puncture wounds to the palmar aspect of the right fifth digit along the mid fifth metacarpal as well as the volar aspect of the fifth digit middle phalanx.  Puncture wounds are slightly gaping and irregular.  He has full flexion extension of the digits with no concern for tendon injury.  No visible or palpable foreign body.  Sensation is intact distally.  Patient's right calf with puncture wound and superficial skin abrasion/tear.  No active bleeding.  No defect in the muscle.  Ankle plantarflexion dorsiflexion  is intact.  There is no visible or palpable foreign body.  Skin:    General: Skin is warm.     Findings: No rash.  Neurological:     Mental Status: He is alert and oriented to person, place, and time.  Psychiatric:        Behavior: Behavior normal.        Thought Content: Thought content normal.     ED Results / Procedures / Treatments   Labs (all labs ordered are listed, but only abnormal results are displayed) Labs Reviewed - No data to display  EKG None  Radiology DG Hand 2 View Right Result Date: 05/05/2023 CLINICAL DATA:  Dog bite to right mid calf EXAM: RIGHT HAND - 2 VIEW COMPARISON:  None Available. FINDINGS: No fracture or dislocation is seen. The joint spaces are preserved. Mild soft tissue irregularity along the medial calf. No radiopaque foreign body  is seen. IMPRESSION: No fracture, dislocation, or radiopaque foreign body is seen. Mild soft tissue irregularity along the medial calf. Electronically Signed   By: Charline Bills M.D.   On: 05/05/2023 20:28   DG Tibia/Fibula Right Result Date: 05/05/2023 CLINICAL DATA:  Dog bite right mid calf EXAM: RIGHT TIBIA AND FIBULA - 2 VIEW COMPARISON:  None Available. FINDINGS: Small soft tissue defect/laceration noted in the mid right calf medial soft tissues. No radiopaque foreign body. No acute bony abnormality. Specifically, no fracture, subluxation, or dislocation. IMPRESSION: No fracture or foreign body. Electronically Signed   By: Charlett Nose M.D.   On: 05/05/2023 20:28    Procedures .Laceration Repair  Date/Time: 05/05/2023 9:24 PM  Performed by: Evon Slack, PA-C Authorized by: Evon Slack, PA-C   Consent:    Consent obtained:  Verbal   Consent given by:  Patient Universal protocol:    Patient identity confirmed:  Verbally with patient Anesthesia:    Anesthesia method:  Local infiltration   Local anesthetic:  Lidocaine 1% WITH epi Laceration details:    Location: Puncture wound with laceration right hand 3.5 cm, puncture wound with laceration to the right calf 3.5 cm.   Length (cm):  7   Depth (mm):  2 Pre-procedure details:    Preparation:  Patient was prepped and draped in usual sterile fashion Exploration:    Imaging obtained: x-ray     Imaging outcome: foreign body not noted     Wound exploration: wound explored through full range of motion   Treatment:    Area cleansed with:  Povidone-iodine and saline   Amount of cleaning:  Extensive   Irrigation volume:  150   Irrigation method:  Pressure wash Skin repair:    Repair method:  Sutures   Suture size:  4-0   Suture material:  Prolene   Suture technique:  Simple interrupted   Number of sutures:  7 Approximation:    Approximation:  Loose Repair type:    Repair type:  Simple     Medications Ordered in  ED Medications  amoxicillin-clavulanate (AUGMENTIN) 875-125 MG per tablet 1 tablet (1 tablet Oral Given 05/05/23 2013)  HYDROcodone-acetaminophen (NORCO/VICODIN) 5-325 MG per tablet 1 tablet (1 tablet Oral Given 05/05/23 2013)  lidocaine-EPINEPHrine (XYLOCAINE W/EPI) 1 %-1:100000 (with pres) injection 30 mL (30 mLs Infiltration Given by Other 05/05/23 2014)  Tdap (BOOSTRIX) injection 0.5 mL (0.5 mLs Intramuscular Given 05/05/23 2013)    ED Course/ Medical Decision Making/ A&P  Medical Decision Making Amount and/or Complexity of Data Reviewed Radiology: ordered.  Risk Prescription drug management.   78 year old male with dog bite to the right hand and right calf.  X-rays negative for foreign body or broken bones.  Tetanus updated here in the ED.  Dog's vaccinations are up-to-date.  Patient placed on prophylactic antibiotics.  Lacerations thoroughly irrigated cleansed and loosely repaired with seven 5-0 Prolene sutures.  Patient educated on wound care and suture removal understands signs symptoms return to the ED for. Final Clinical Impression(s) / ED Diagnoses Final diagnoses:  Dog bite of right hand, initial encounter  Dog bite of right calf, initial encounter    Rx / DC Orders ED Discharge Orders          Ordered    amoxicillin-clavulanate (AUGMENTIN) 875-125 MG tablet  2 times daily        05/05/23 1956    HYDROcodone-acetaminophen (NORCO/VICODIN) 5-325 MG tablet  Every 6 hours PRN        05/05/23 1956              Ronnette Juniper 05/05/23 2129    Corena Herter, MD 05/06/23 [phone]

## 2023-07-18 ENCOUNTER — Other Ambulatory Visit: Payer: Self-pay | Admitting: Urology

## 2023-07-18 DIAGNOSIS — N401 Enlarged prostate with lower urinary tract symptoms: Secondary | ICD-10-CM

## 2023-07-20 ENCOUNTER — Encounter (HOSPITAL_COMMUNITY): Payer: Self-pay

## 2023-07-20 ENCOUNTER — Emergency Department

## 2023-07-20 ENCOUNTER — Encounter: Payer: Self-pay | Admitting: Emergency Medicine

## 2023-07-20 ENCOUNTER — Emergency Department
Admission: EM | Admit: 2023-07-20 | Discharge: 2023-07-20 | Disposition: A | Discharge status: Home / Self Care | Attending: Emergency Medicine | Admitting: Emergency Medicine

## 2023-07-20 ENCOUNTER — Other Ambulatory Visit: Payer: Self-pay

## 2023-07-20 DIAGNOSIS — R471 Dysarthria and anarthria: Secondary | ICD-10-CM | POA: Insufficient documentation

## 2023-07-20 DIAGNOSIS — I6621 Occlusion and stenosis of right posterior cerebral artery: Secondary | ICD-10-CM

## 2023-07-20 DIAGNOSIS — R791 Abnormal coagulation profile: Secondary | ICD-10-CM | POA: Insufficient documentation

## 2023-07-20 DIAGNOSIS — R531 Weakness: Secondary | ICD-10-CM | POA: Insufficient documentation

## 2023-07-20 DIAGNOSIS — R270 Ataxia, unspecified: Secondary | ICD-10-CM | POA: Insufficient documentation

## 2023-07-20 DIAGNOSIS — Z79899 Other long term (current) drug therapy: Secondary | ICD-10-CM | POA: Diagnosis not present

## 2023-07-20 DIAGNOSIS — I6782 Cerebral ischemia: Secondary | ICD-10-CM | POA: Insufficient documentation

## 2023-07-20 DIAGNOSIS — R202 Paresthesia of skin: Secondary | ICD-10-CM | POA: Insufficient documentation

## 2023-07-20 DIAGNOSIS — Z87891 Personal history of nicotine dependence: Secondary | ICD-10-CM | POA: Diagnosis not present

## 2023-07-20 DIAGNOSIS — I639 Cerebral infarction, unspecified: Secondary | ICD-10-CM

## 2023-07-20 DIAGNOSIS — Z8673 Personal history of transient ischemic attack (TIA), and cerebral infarction without residual deficits: Secondary | ICD-10-CM | POA: Insufficient documentation

## 2023-07-20 LAB — CBC
HCT: 47.2 % (ref 39.0–52.0)
Hemoglobin: 15.6 g/dL (ref 13.0–17.0)
MCH: 29.4 pg (ref 26.0–34.0)
MCHC: 33.1 g/dL (ref 30.0–36.0)
MCV: 89.1 fL (ref 80.0–100.0)
Platelets: 193 10*3/uL (ref 150–400)
RBC: 5.3 MIL/uL (ref 4.22–5.81)
RDW: 13.6 % (ref 11.5–15.5)
WBC: 10.5 10*3/uL (ref 4.0–10.5)
nRBC: 0 % (ref 0.0–0.2)

## 2023-07-20 LAB — DIFFERENTIAL
Abs Immature Granulocytes: 0.05 10*3/uL (ref 0.00–0.07)
Basophils Absolute: 0 10*3/uL (ref 0.0–0.1)
Basophils Relative: 0 %
Eosinophils Absolute: 0 10*3/uL (ref 0.0–0.5)
Eosinophils Relative: 0 %
Immature Granulocytes: 1 %
Lymphocytes Relative: 13 %
Lymphs Abs: 1.3 10*3/uL (ref 0.7–4.0)
Monocytes Absolute: 0.5 10*3/uL (ref 0.1–1.0)
Monocytes Relative: 4 %
Neutro Abs: 8.7 10*3/uL — ABNORMAL HIGH (ref 1.7–7.7)
Neutrophils Relative %: 82 %

## 2023-07-20 LAB — APTT: aPTT: 24 s (ref 24–36)

## 2023-07-20 LAB — COMPREHENSIVE METABOLIC PANEL WITH GFR
ALT: 19 U/L (ref 0–44)
AST: 22 U/L (ref 15–41)
Albumin: 3.7 g/dL (ref 3.5–5.0)
Alkaline Phosphatase: 58 U/L (ref 38–126)
Anion gap: 7 (ref 5–15)
BUN: 28 mg/dL — ABNORMAL HIGH (ref 8–23)
CO2: 26 mmol/L (ref 22–32)
Calcium: 9 mg/dL (ref 8.9–10.3)
Chloride: 104 mmol/L (ref 98–111)
Creatinine, Ser: 1.04 mg/dL (ref 0.61–1.24)
GFR, Estimated: >60 mL/min (ref >60)
Glucose, Bld: 137 mg/dL — ABNORMAL HIGH (ref 70–99)
Potassium: 4.3 mmol/L (ref 3.5–5.1)
Sodium: 137 mmol/L (ref 135–145)
Total Bilirubin: 0.9 mg/dL (ref 0.0–1.2)
Total Protein: 6.3 g/dL — ABNORMAL LOW (ref 6.5–8.1)

## 2023-07-20 LAB — PROTIME-INR
INR: 1 (ref 0.8–1.2)
Prothrombin Time: 13.5 s (ref 11.4–15.2)

## 2023-07-20 LAB — ETHANOL: Alcohol, Ethyl (B): <15 mg/dL (ref <15)

## 2023-07-20 LAB — CBG MONITORING, ED: Glucose-Capillary: 160 mg/dL — ABNORMAL HIGH (ref 70–99)

## 2023-07-20 MED ORDER — ASPIRIN 325 MG PO TBEC
325.0000 mg | DELAYED_RELEASE_TABLET | Freq: Once | ORAL | Status: AC
  Administered 2023-07-20: 325 mg via ORAL
  Filled 2023-07-20: qty 1

## 2023-07-20 MED ORDER — IOHEXOL 350 MG/ML SOLN
75.0000 mL | Freq: Once | INTRAVENOUS | Status: AC | PRN
  Administered 2023-07-20: 75 mL via INTRAVENOUS

## 2023-07-20 MED ORDER — SODIUM CHLORIDE 0.9 % IV SOLN: INTRAVENOUS | Status: DC

## 2023-07-20 MED ORDER — SODIUM CHLORIDE 0.9% FLUSH
3.0000 mL | Freq: Once | INTRAVENOUS | Status: AC
  Administered 2023-07-20: 3 mL via INTRAVENOUS

## 2023-07-20 MED ORDER — IOHEXOL 350 MG/ML SOLN
50.0000 mL | Freq: Once | INTRAVENOUS | Status: AC | PRN
  Administered 2023-07-20: 50 mL via INTRAVENOUS

## 2023-07-20 MED ORDER — CLOPIDOGREL BISULFATE 75 MG PO TABS
300.0000 mg | ORAL_TABLET | Freq: Once | ORAL | Status: AC
  Administered 2023-07-20: 300 mg via ORAL
  Filled 2023-07-20: qty 4

## 2023-07-20 NOTE — Consult Note (Addendum)
 NEUROLOGY CONSULT NOTE   Date of service: Jul 20, 2023 Patient Name: George Bruce MRN:  [id number] DOB:  1945-08-05 Chief Complaint: "Left sided weakness" Requesting Provider: Kandee Orion, MD  History of Present Illness  George Bruce is a 78 y.o. male with a PMHx as listed below (reviewed) who presents to the ED after acute onset of left arm and leg weakness, left sided incoordination, gait unsteadiness and dizziness (feels like rocking on a boat). Symptoms were first noted at 1:00 PM when his legs gave out on him and he settled himself to the floor. He was then able to get up but felt weak in both legs, so he got into a recliner and took a nap. On getting up, he then felt gait unsteadiness with L > R leg weakness. He went to the kitchen to eat and had some difficulty handling utensils with his left hand. After dinner he went to the bathroom to brush his teeth and noted a worsening of his left hand weakness, dropping the toothbrush several times. He then decided to go to the ED for evaluation.   LKW: 1300 Modified rankin score: 0-Completely asymptomatic and back to baseline post- stroke IV Thrombolysis:  No: Outside of the time window EVT: No: CTP without sufficient penumbra  NIHSS components Score: Comment  1a Level of Conscious 0[x]  1[]  2[]  3[]      1b LOC Questions 0[x]  1[]  2[]       1c LOC Commands 0[x]  1[]  2[]       2 Best Gaze 0[x]  1[]  2[]       3 Visual 0[x]  1[]  2[]  3[]      4 Facial Palsy 0[x]  1[]  2[]  3[]      5a Motor Arm - left 0[]  1[x]  2[]  3[]  4[]  UN[]    5b Motor Arm - Right 0[x]  1[]  2[]  3[]  4[]  UN[]    6a Motor Leg - Left 0[]  1[x]  2[]  3[]  4[]  UN[]    6b Motor Leg - Right 0[x]  1[]  2[]  3[]  4[]  UN[]    7 Limb Ataxia 0[]  1[]  2[x]  UN[]      8 Sensory 0[]  1[x]  2[]  UN[]      9 Best Language 0[x]  1[]  2[]  3[]      10 Dysarthria 0[]  1[x]  2[]  UN[]      11 Extinct. 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   Hypertension    Wears dentures    partial upper and lower    Past Surgical History:  Procedure Laterality Date   CATARACT EXTRACTION W/PHACO Right 09/29/2022   Procedure: CATARACT EXTRACTION PHACO AND INTRAOCULAR LENS PLACEMENT (IOC) RIGHT  10.98  00:56.5;  Surgeon: Rosa College, MD;  Location: [facility];  Service: Ophthalmology;  Laterality: Right;   CATARACT EXTRACTION W/PHACO Left 10/13/2022   Procedure: CATARACT EXTRACTION PHACO AND INTRAOCULAR LENS PLACEMENT (IOC) LEFT 10.79 00:54.1;  Surgeon: Rosa College, MD;  Location: [facility];  Service: Ophthalmology;  Laterality: Left;   COLONOSCOPY WITH PROPOFOL  N/A 04/21/2018   Procedure: COLONOSCOPY WITH  PROPOFOL ;  Surgeon: George Mannan, MD;  Location: [facility];  Service: Endoscopy;  Laterality: N/A;   INGUINAL HERNIA REPAIR Left 1990's   INGUINAL HERNIA REPAIR Right 2015   REVERSE SHOULDER ARTHROPLASTY Left 11/06/2015   Procedure: REVERSE SHOULDER ARTHROPLASTY;  Surgeon: Elner Hahn, MD;  Location: [facility];  Service: Orthopedics;  Laterality: Left;   REVERSE SHOULDER ARTHROPLASTY Right 03/25/2022   Procedure: REVERSE SHOULDER ARTHROPLASTY;  Surgeon: Elner Hahn, MD;  Location: [facility];  Service: Orthopedics;  Laterality: Right;  MAKE 2ND CASE    Family History: Family History  Problem Relation Age of Onset   Diabetes Mother     Social History  reports that he quit smoking about 28 years ago. His smoking use included cigarettes. He has never used smokeless tobacco. He reports current alcohol use of about 7.0 standard drinks of alcohol per week. He reports that he does not use  drugs.  Allergies  Allergen Reactions   Iodine Rash    topical    Medications   Current Facility-Administered Medications:    sodium chloride  flush (NS) 0.9 % injection 3 mL, 3 mL, Intravenous, Once, Wells, Achilles Holes, MD  Current Outpatient Medications:    finasteride  (PROSCAR ) 5 MG tablet, TAKE ONE TABLET (5 MG) BY MOUTH EVERY DAY, Disp: 90 tablet, Rfl: 3   HYDROcodone -acetaminophen  (NORCO/VICODIN) 5-325 MG tablet, Take 1 tablet by mouth every 6 (six) hours as needed for moderate pain (pain score 4-6)., Disp: 15 tablet, Rfl: 0   Multiple Vitamin (MULTIVITAMIN WITH MINERALS) TABS tablet, Take 1 tablet by mouth daily., Disp: , Rfl:    naproxen (NAPROSYN) 500 MG tablet, Take 500 mg by mouth 2 (two) times daily with a meal., Disp: , Rfl:    sildenafil  (REVATIO ) 20 MG tablet, TAKE 3 TO 5 TABLETS EVERY DAY AS NEEDED, Disp: , Rfl:    simvastatin  (ZOCOR ) 40 MG tablet, Take 40 mg by mouth daily at 6 PM., Disp: , Rfl:    tamsulosin  (FLOMAX ) 0.4 MG CAPS capsule, TAKE ONE CAPSULE DAILY 30 MINS AFTER THE SAME MEAL EACH DAY., Disp: 90 capsule, Rfl: 1  Vitals   Vitals:   Jul 23, 2023 1830  BP: (!) 178/91  Pulse: 69  Resp: 16  Temp: 98.1 F (36.7 C)  TempSrc: Oral  SpO2: 99%    There is no height or weight on file to calculate BMI.  Physical Exam   Constitutional: Appears well-developed and well-nourished.  Psych: Affect appropriate to situation.  Eyes: No scleral injection.  HENT: No OP obstruction.  Head: Normocephalic.  Respiratory: Effort normal, non-labored breathing.    Neurologic Examination   See NIHSS  Labs/Imaging/Neurodiagnostic studies   CBC:  Recent Labs  Lab Jul 23, 2023 1829  WBC 10.5  NEUTROABS 8.7*  HGB 15.6  HCT 47.2  MCV 89.1  PLT 193   Basic Metabolic Panel:  Lab Results  Component Value Date   NA 139 03/13/2022   K 4.0 03/13/2022   CO2 25 03/13/2022   GLUCOSE 95 03/13/2022   BUN 20 03/13/2022   CREATININE 0.94 03/13/2022   CALCIUM 8.9 03/13/2022    GFRNONAA >60 03/13/2022   GFRAA >60 05/09/2018   Lipid Panel: No results found for: "LDLCALC" HgbA1c: No results found for: "HGBA1C" Urine Drug Screen: No results found for: "LABOPIA", "COCAINSCRNUR", "LABBENZ", "AMPHETMU", "THCU", "LABBARB"  Alcohol Level No results found for: "ETH" INR  Lab Results  Component Value Date   INR 1.09 11/02/2015   APTT No results found for: "APTT" AED  levels: No results found for: "PHENYTOIN", "ZONISAMIDE", "LAMOTRIGINE", "LEVETIRACETA"   ASSESSMENT  George Bruce is a 78 y.o. left handed male who presents to the ED with acute onset of left sided weakness.  - Exam reveals left hemiataxia, mild left sided weakness, mild dysarthria, left sided sensory numbness and left sided extinction. NIHSS 7.  - CT head: No acute intracranial hemorrhage or evidence of an acute infarct. 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 Background parenchymal atrophy and chronic small vessel ischemic disease. - CTA neck: Streak/beam hardening artifact arising from a dense contrast bolus partially obscures the aortic arch and bilateral subclavian arteries. The common carotid and internal carotid arteries are patent within the neck. Atherosclerotic plaque bilaterally, as described. Most notably, atherosclerotic plaque results in less than 50% stenosis of the proximal left internal carotid artery. The vertebral arteries are patent within the neck without stenosis. 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 There is some reconstitution of enhancement within right posterior cerebral artery branches beginning at the distal P2 segment level. 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 Atherosclerotic plaque within the intracranial internal carotid arteries  with no more than mild stenosis. - Not a TNK candidate due to time criteria.  - CT perfusion: No core infarct. Tmax>6.0s) volume: 0mL. However, there is a hypoperfused region in the right occipital lobe with Tmax > 4.0 seconds. Discussed with Dr. Alvira Josephs. Although not an emergent thrombectomy candidate, the patient should be transferred to [facility] for close monitoring. If he clinically worsens, then will need to be reconsidered for thrombectomy.   RECOMMENDATIONS  - ASA 325 mg and Plavix 300 mg po crushed x 1 now (ordered) - Transfer to [facility] for close neuromonitoring. If no bed available, transfer ED to ED.  - Frequent neuro checks - IVNS at 125 cc/hr - Permissive HTN - Will need full stroke work up at [facility] - Discussed with EDP via SecureChat - Plan communicated to Dr. Alecia Ames at [facility] ______________________________________________________________________    Hope Ly, Mohamad Bruso, MD [facility]

## 2023-07-20 NOTE — ED Notes (Signed)
 1832  Code stroke activation.  Pt in CT.  Pt with c/o left upper and lower weakness with dizziness.  Initial LKW stated at 1800 per pt.  1835  Dr. Renaee Caro paged  [phone]  Dr. Renaee Caro called to verify he received page  1852  pt from CT.  Discussed with pt his symptoms and his LKW.  States he was fine until he got up to let the dog out today at 1300.  States he had to almost crawl back to the chair because his left side was weak.  He took at nap and got up for dinner at 1800 and still had left side weakness but improved from 1300 today but did not return to baseline.    1857  Dr. Lindzen in person at bedside to evaluate pt.    1927 back to CT.  mRs 0

## 2023-07-20 NOTE — ED Notes (Signed)
 Code stroke called on this patient, called care link at 6:30pm

## 2023-07-20 NOTE — ED Provider Notes (Addendum)
 [facility] Provider Note    Event Date/Time   First MD Initiated Contact with Patient 07/20/23 1832     (approximate)   History   Code Stroke   HPI George Bruce is a 78 y.o. male with history of HLD presenting today for left-sided weakness.  Patient states he was doing his normal activities and ate dinner.  Around 6 PM right after eating dinner he went to brush his teeth and started having difficulty using his left arm and standing on his left leg.  Felt numbness sensations throughout there.  Denied any speech or vision changes.  Promptly went to the ED.  Recently been on prednisone for chronic sciatica issues.  Otherwise denies cough, congestion, shortness of breath, chest pain.  No prior CVA history.  Stroke alert called in triage.     Physical Exam   Triage Vital Signs: ED Triage Vitals [07/20/23 1830]  Encounter Vitals Group     BP (!) 178/91     Systolic BP Percentile      Diastolic BP Percentile      Pulse Rate 69     Resp 16     Temp 98.1 F (36.7 C)     Temp Source Oral     SpO2 99 %     Weight      Height      Head Circumference      Peak Flow      Pain Score      Pain Loc      Pain Education      Exclude from Growth Chart     Most recent vital signs: Vitals:   07/20/23 2030 07/20/23 2100  BP: (!) 153/85 (!) 165/77  Pulse:  (!) 56  Resp: 19 20  Temp:    SpO2:  98%   Physical Exam: I have reviewed the vital signs and nursing notes. General: Awake, alert, no acute distress.  Nontoxic appearing. Head:  Atraumatic, normocephalic.   ENT:  EOM intact, PERRL. Oral mucosa is pink and moist with no lesions. Neck: Neck is supple with full range of motion, No meningeal signs. Cardiovascular:  RRR, No murmurs. Peripheral pulses palpable and equal bilaterally. Respiratory:  Symmetrical chest wall expansion.  No rhonchi, rales, or wheezes.  Good air movement throughout.  No use of accessory muscles.   Musculoskeletal:  No cyanosis or  edema. Moving extremities with full ROM Abdomen:  Soft, nontender, nondistended. Neuro:  GCS 15, left upper extremity drifts but does not hit bed.  No drifting of right upper extremity.  Diminished to no sensation in left upper extremity.  Normal sensation in right upper extremity.  Diminished sensation throughout left side of face without facial droop.  No significant speech changes.  No aphasia.  Drift of the left lower extremity with diminished sensation in comparison to right.  No concerns with right lower extremity. Psych:  Calm, appropriate.   Skin:  Warm, dry, no rash.    ED Results / Procedures / Treatments   Labs (all labs ordered are listed, but only abnormal results are displayed) Labs Reviewed  DIFFERENTIAL - Abnormal; Notable for the following components:      Result Value   Neutro Abs 8.7 (*)    All other components within normal limits  COMPREHENSIVE METABOLIC PANEL WITH GFR - Abnormal; Notable for the following components:   Glucose, Bld 137 (*)    BUN 28 (*)    Total Protein 6.3 (*)  All other components within normal limits  CBG MONITORING, ED - Abnormal; Notable for the following components:   Glucose-Capillary 160 (*)    All other components within normal limits  PROTIME-INR  APTT  CBC  ETHANOL  CBG MONITORING, ED     EKG My EKG interpretation: Rate of 59, low normal axis, normal intervals.  No acute ST elevations or depressions   RADIOLOGY Independently interpreted CT head and CTA head and neck with concerns of occlusion and right PCA.  Perfusion with no deficits   PROCEDURES:  Critical Care performed: Yes, see critical care procedure note(s)  .Critical Care  Performed by: Kandee Orion, MD Authorized by: Kandee Orion, MD   Critical care provider statement:    Critical care time (minutes):  30   Critical care was necessary to treat or prevent imminent or life-threatening deterioration of the following conditions: stroke.   Critical care was  time spent personally by me on the following activities:  Development of treatment plan with patient or surrogate, discussions with consultants, evaluation of patient's response to treatment, examination of patient, ordering and review of laboratory studies, ordering and review of radiographic studies, ordering and performing treatments and interventions, pulse oximetry, re-evaluation of patient's condition and review of old charts   I assumed direction of critical care for this patient from another provider in my specialty: no     Care discussed with: admitting provider      MEDICATIONS ORDERED IN ED: Medications  0.9 %  sodium chloride  infusion ( Intravenous New Bag/Given 07/20/23 2037)  sodium chloride  flush (NS) 0.9 % injection 3 mL (3 mLs Intravenous Given 07/20/23 1923)  iohexol  (OMNIPAQUE ) 350 MG/ML injection 75 mL (75 mLs Intravenous Contrast Given 07/20/23 1845)  iohexol  (OMNIPAQUE ) 350 MG/ML injection 50 mL (50 mLs Intravenous Contrast Given 07/20/23 1929)  aspirin  EC tablet 325 mg (325 mg Oral Given 07/20/23 2038)  clopidogrel (PLAVIX) tablet 300 mg (300 mg Oral Given 07/20/23 2038)     IMPRESSION / MDM / ASSESSMENT AND PLAN / ED COURSE  I reviewed the triage vital signs and the nursing notes.                              Differential diagnosis includes, but is not limited to, CVA, ICH  Patient's presentation is most consistent with acute presentation with potential threat to life or bodily function.  Patient is an 78-year-old male presenting today for acute onset left-sided numbness and weakness.  Has drifting to both left upper extremity and left lower extremity but does not hit the bed.  Diminished sensation throughout.  Slight dysarthria and left-sided facial numbness.  Also has ataxia.  NIHSS of 7.  Stroke alert called.  Neurology evaluated patient.  Did not consider him candidate for TNK at that time.  CT head and eventually CTA head and neck show occlusion in the right PCA.   Neurology discussed with interventional neurology at [facility] and nonemergent thrombectomy candidate.  They did wish for him to be transferred to [facility] for closer monitoring in case he worsens and then would be reconsidered for thrombectomy.  Given aspirin  and Plavix at this time.  Initially discussed case with hospitalist at [facility].  Given long delay in transfer, discussed with the ED for ED to ED transfer.  They have agreed to transfer. [facility] picked patient up for transfer.  The patient is on the cardiac monitor to evaluate for  evidence of arrhythmia and/or significant heart rate changes. Clinical Course as of 07/20/23 2248  Mon Jul 20, 2023  1852 NIHSS of 4 [DW]  1904 Radiology - No acute findings on CT head. R posterior cerebral artery occlusion proximally. At P1/P2 junction. [DW]  2043 Neurology deciding against TNK at this time.  No emergent thrombectomy needed but given concerns if symptoms worsen, they recommended transfer to [place] to be monitored for the next 24 hours in case he has worsening of symptoms in which case he will need thrombectomy. [DW]  2106 Dr. Michell Ahumada with [facility] Hospitalist has agreed to admit [DW]  2233 Dr. Lewis Red to accept ED to ED given significant delay in bed availability at [facility] [DW]    Clinical Course User Index [DW] Kandee Orion, MD     FINAL CLINICAL IMPRESSION(S) / ED DIAGNOSES   Final diagnoses:  Cerebrovascular accident (CVA), unspecified mechanism (HCC)     Rx / DC Orders   ED Discharge Orders     None        Note:  This document was prepared using Dragon voice recognition software and may include unintentional dictation errors.   Kandee Orion, MD 07/20/23 Monna Antes    Kandee Orion, MD 07/20/23 2325

## 2023-07-20 NOTE — Plan of Care (Signed)
 Plan of Care Note for accepted transfer   Patient name: George Bruce EAV:[id number] DOB: 18-Oct-1945  Facility requesting transfer: [facility] ED Requesting Provider: Dr. Karlynn Oyster Facility course: 78 year old male with history of hypertension, hyperlipidemia presented to the ED as code stroke for evaluation of acute onset left-sided weakness/numbness.  Out of time window for IV thrombolytics at the time of arrival.  No significant lab abnormalities on CBC and CMP.  CT head/CTA head and neck showing: "IMPRESSION: Non-contrast head CT:   1. No acute intracranial hemorrhage or evidence of an acute infarct. 2. 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 3. Background parenchymal atrophy and chronic small vessel ischemic disease.   CTA neck:   1. Streak/beam hardening artifact arising from a dense contrast bolus partially obscures the aortic arch and bilateral subclavian arteries. 2. The common carotid and internal carotid arteries are patent within the neck. Atherosclerotic plaque bilaterally, as described. Most notably, atherosclerotic plaque results in less than 50% stenosis of the proximal left internal carotid artery. 3. The vertebral arteries are patent within the neck without stenosis. 4. Aortic Atherosclerosis (ICD10-I70.0).   CTA head:   1. 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 There is some reconstitution of enhancement within right posterior cerebral artery branches beginning at the distal P2 segment level. 2. 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 3. Atherosclerotic plaque within the intracranial internal carotid arteries with no more than mild stenosis."  CT cerebral perfusion study showing: "IMPRESSION: No emergent large vessel occlusion or high-grade stenosis of  the intracranial arteries."   Neurology discussed the case with neuro IR (Dr. Alvira Josephs) and it was felt that the patient was not an emergent thrombectomy candidate.  Recommended transfer to [facility] for close monitoring and if he clinically worsens then he will need to be reconsidered for thrombectomy.  Patient was given aspirin  and Plavix.  Needs stroke workup.    Plan of care: The patient is accepted for admission to Progressive unit at [facility].  [facility] will assume care on arrival to accepting facility. Until arrival, care as per EDP. However, [facility] available 24/7 for questions and assistance.  Check [email or web address] for on-call coverage.  Nursing staff, please call [facility] Admits & Consults System-Wide number under Amion on patient's arrival so appropriate admitting provider can evaluate the pt.

## 2023-07-20 NOTE — ED Notes (Signed)
 Attempted to call primary RN to informed her that pt will be coming to room after CT

## 2023-07-20 NOTE — ED Notes (Signed)
 Pt taken to CT 1 at this time. Teleneuro cart on in CT.

## 2023-07-20 NOTE — ED Triage Notes (Signed)
 Pt to ED via POV with wife. Pt reports that around 1800 he noticed he was having weakness in his left arm and leg. Pt reports that he was trying to brush his teeth and he kept dropping the toothbrush in the sink. Pt having to lift left leg to move it. Pt has drift in left arm. No slurred speech or facial droop noted. Pt does report an episode of dizziness earlier today but states that had completely resolved.   Pt also reports that he is on his second round of prednisone for back pain.

## 2023-07-20 NOTE — ED Notes (Signed)
 Neurologist Lindzen at pt bedside

## 2023-07-20 NOTE — ED Notes (Signed)
 ED charge RN Pattie Borders and ED Secretary Valinda Gault informed pt is going to be a CODE STROKE

## 2023-07-20 NOTE — ED Notes (Signed)
 Returned from CT and family at bedside x2. CCMD called to confirm cardiac monitoring.

## 2023-07-20 NOTE — ED Notes (Signed)
 Pt in room 14 awaiting tele-neuro consult.

## 2023-07-21 ENCOUNTER — Encounter (HOSPITAL_COMMUNITY): Payer: Self-pay | Admitting: Internal Medicine

## 2023-07-21 ENCOUNTER — Other Ambulatory Visit (HOSPITAL_COMMUNITY)

## 2023-07-21 ENCOUNTER — Inpatient Hospital Stay (HOSPITAL_COMMUNITY)

## 2023-07-21 ENCOUNTER — Inpatient Hospital Stay (HOSPITAL_COMMUNITY)
Admission: EM | Admit: 2023-07-21 | Discharge: 2023-07-22 | DRG: 065 | Disposition: A | Discharge status: Home / Self Care | Source: Other Acute Inpatient Hospital | Attending: Internal Medicine | Admitting: Internal Medicine

## 2023-07-21 DIAGNOSIS — Z85828 Personal history of other malignant neoplasm of skin: Secondary | ICD-10-CM | POA: Diagnosis not present

## 2023-07-21 DIAGNOSIS — Z87891 Personal history of nicotine dependence: Secondary | ICD-10-CM | POA: Diagnosis not present

## 2023-07-21 DIAGNOSIS — I63531 Cerebral infarction due to unspecified occlusion or stenosis of right posterior cerebral artery: Secondary | ICD-10-CM | POA: Diagnosis present

## 2023-07-21 DIAGNOSIS — E7800 Pure hypercholesterolemia, unspecified: Secondary | ICD-10-CM | POA: Diagnosis present

## 2023-07-21 DIAGNOSIS — R030 Elevated blood-pressure reading, without diagnosis of hypertension: Secondary | ICD-10-CM | POA: Diagnosis not present

## 2023-07-21 DIAGNOSIS — Z7982 Long term (current) use of aspirin: Secondary | ICD-10-CM

## 2023-07-21 DIAGNOSIS — Z833 Family history of diabetes mellitus: Secondary | ICD-10-CM | POA: Diagnosis not present

## 2023-07-21 DIAGNOSIS — R29704 NIHSS score 4: Secondary | ICD-10-CM | POA: Diagnosis not present

## 2023-07-21 DIAGNOSIS — I6389 Other cerebral infarction: Secondary | ICD-10-CM | POA: Diagnosis not present

## 2023-07-21 DIAGNOSIS — Z96612 Presence of left artificial shoulder joint: Secondary | ICD-10-CM | POA: Diagnosis present

## 2023-07-21 DIAGNOSIS — E785 Hyperlipidemia, unspecified: Secondary | ICD-10-CM | POA: Diagnosis present

## 2023-07-21 DIAGNOSIS — R3914 Feeling of incomplete bladder emptying: Secondary | ICD-10-CM | POA: Diagnosis present

## 2023-07-21 DIAGNOSIS — R270 Ataxia, unspecified: Secondary | ICD-10-CM | POA: Diagnosis present

## 2023-07-21 DIAGNOSIS — Z9842 Cataract extraction status, left eye: Secondary | ICD-10-CM | POA: Diagnosis not present

## 2023-07-21 DIAGNOSIS — I10 Essential (primary) hypertension: Secondary | ICD-10-CM | POA: Diagnosis present

## 2023-07-21 DIAGNOSIS — I6381 Other cerebral infarction due to occlusion or stenosis of small artery: Secondary | ICD-10-CM | POA: Diagnosis present

## 2023-07-21 DIAGNOSIS — Z96611 Presence of right artificial shoulder joint: Secondary | ICD-10-CM | POA: Diagnosis present

## 2023-07-21 DIAGNOSIS — R29707 NIHSS score 7: Secondary | ICD-10-CM | POA: Diagnosis present

## 2023-07-21 DIAGNOSIS — G8194 Hemiplegia, unspecified affecting left nondominant side: Secondary | ICD-10-CM | POA: Diagnosis present

## 2023-07-21 DIAGNOSIS — Z8673 Personal history of transient ischemic attack (TIA), and cerebral infarction without residual deficits: Secondary | ICD-10-CM

## 2023-07-21 DIAGNOSIS — Z7902 Long term (current) use of antithrombotics/antiplatelets: Secondary | ICD-10-CM

## 2023-07-21 DIAGNOSIS — Z9841 Cataract extraction status, right eye: Secondary | ICD-10-CM

## 2023-07-21 DIAGNOSIS — N401 Enlarged prostate with lower urinary tract symptoms: Secondary | ICD-10-CM | POA: Diagnosis present

## 2023-07-21 DIAGNOSIS — R739 Hyperglycemia, unspecified: Secondary | ICD-10-CM | POA: Diagnosis present

## 2023-07-21 DIAGNOSIS — I639 Cerebral infarction, unspecified: Principal | ICD-10-CM | POA: Diagnosis present

## 2023-07-21 DIAGNOSIS — M47816 Spondylosis without myelopathy or radiculopathy, lumbar region: Secondary | ICD-10-CM | POA: Diagnosis present

## 2023-07-21 DIAGNOSIS — Z961 Presence of intraocular lens: Secondary | ICD-10-CM | POA: Diagnosis present

## 2023-07-21 DIAGNOSIS — I779 Disorder of arteries and arterioles, unspecified: Secondary | ICD-10-CM

## 2023-07-21 DIAGNOSIS — M160 Bilateral primary osteoarthritis of hip: Secondary | ICD-10-CM | POA: Diagnosis present

## 2023-07-21 DIAGNOSIS — Z9889 Other specified postprocedural states: Secondary | ICD-10-CM

## 2023-07-21 DIAGNOSIS — M5432 Sciatica, left side: Secondary | ICD-10-CM | POA: Diagnosis present

## 2023-07-21 DIAGNOSIS — Z888 Allergy status to other drugs, medicaments and biological substances status: Secondary | ICD-10-CM | POA: Diagnosis not present

## 2023-07-21 DIAGNOSIS — Z8719 Personal history of other diseases of the digestive system: Secondary | ICD-10-CM

## 2023-07-21 LAB — CBC
HCT: 42.8 % (ref 39.0–52.0)
Hemoglobin: 14.4 g/dL (ref 13.0–17.0)
MCH: 29.6 pg (ref 26.0–34.0)
MCHC: 33.6 g/dL (ref 30.0–36.0)
MCV: 87.9 fL (ref 80.0–100.0)
Platelets: 154 10*3/uL (ref 150–400)
RBC: 4.87 MIL/uL (ref 4.22–5.81)
RDW: 13.9 % (ref 11.5–15.5)
WBC: 10.3 10*3/uL (ref 4.0–10.5)
nRBC: 0 % (ref 0.0–0.2)

## 2023-07-21 LAB — COMPREHENSIVE METABOLIC PANEL WITH GFR
ALT: 16 U/L (ref 0–44)
AST: 17 U/L (ref 15–41)
Albumin: 3 g/dL — ABNORMAL LOW (ref 3.5–5.0)
Alkaline Phosphatase: 41 U/L (ref 38–126)
Anion gap: 7 (ref 5–15)
BUN: 18 mg/dL (ref 8–23)
CO2: 25 mmol/L (ref 22–32)
Calcium: 8.3 mg/dL — ABNORMAL LOW (ref 8.9–10.3)
Chloride: 109 mmol/L (ref 98–111)
Creatinine, Ser: 1.04 mg/dL (ref 0.61–1.24)
GFR, Estimated: >60 mL/min (ref >60)
Glucose, Bld: 85 mg/dL (ref 70–99)
Potassium: 3.9 mmol/L (ref 3.5–5.1)
Sodium: 141 mmol/L (ref 135–145)
Total Bilirubin: 0.7 mg/dL (ref 0.0–1.2)
Total Protein: 5 g/dL — ABNORMAL LOW (ref 6.5–8.1)

## 2023-07-21 LAB — HEMOGLOBIN A1C
Hgb A1c MFr Bld: 5.6 % (ref 4.8–5.6)
Mean Plasma Glucose: 114 mg/dL

## 2023-07-21 LAB — LIPID PANEL
Cholesterol: 103 mg/dL (ref 0–200)
HDL: 37 mg/dL — ABNORMAL LOW (ref >40)
LDL Cholesterol: 53 mg/dL (ref 0–99)
Total CHOL/HDL Ratio: 2.8 ratio
Triglycerides: 65 mg/dL (ref <150)
VLDL: 13 mg/dL (ref 0–40)

## 2023-07-21 MED ORDER — ENOXAPARIN SODIUM 40 MG/0.4ML IJ SOSY
40.0000 mg | PREFILLED_SYRINGE | INTRAMUSCULAR | Status: DC
  Administered 2023-07-21 – 2023-07-22 (×2): 40 mg via SUBCUTANEOUS
  Filled 2023-07-21 (×2): qty 0.4

## 2023-07-21 MED ORDER — ASPIRIN 300 MG RE SUPP: 300.0000 mg | Freq: Every day | RECTAL | Status: DC

## 2023-07-21 MED ORDER — FINASTERIDE 5 MG PO TABS
5.0000 mg | ORAL_TABLET | Freq: Every day | ORAL | Status: DC
  Administered 2023-07-21 – 2023-07-22 (×2): 5 mg via ORAL
  Filled 2023-07-21 (×2): qty 1

## 2023-07-21 MED ORDER — PREDNISONE 5 MG PO TABS
10.0000 mg | ORAL_TABLET | Freq: Every day | ORAL | Status: AC
  Administered 2023-07-22: 10 mg via ORAL
  Filled 2023-07-21: qty 2

## 2023-07-21 MED ORDER — STROKE: EARLY STAGES OF RECOVERY BOOK
Freq: Once | Status: AC
  Filled 2023-07-21: qty 1

## 2023-07-21 MED ORDER — ACETAMINOPHEN 160 MG/5ML PO SOLN: 650.0000 mg | ORAL | Status: DC | PRN

## 2023-07-21 MED ORDER — ASPIRIN 81 MG PO TBEC
81.0000 mg | DELAYED_RELEASE_TABLET | Freq: Every day | ORAL | Status: DC
  Administered 2023-07-21 – 2023-07-22 (×2): 81 mg via ORAL
  Filled 2023-07-21 (×2): qty 1

## 2023-07-21 MED ORDER — PREDNISONE 20 MG PO TABS
20.0000 mg | ORAL_TABLET | Freq: Every day | ORAL | Status: AC
  Administered 2023-07-21: 20 mg via ORAL
  Filled 2023-07-21: qty 1

## 2023-07-21 MED ORDER — ACETAMINOPHEN 650 MG RE SUPP: 650.0000 mg | RECTAL | Status: DC | PRN

## 2023-07-21 MED ORDER — TAMSULOSIN HCL 0.4 MG PO CAPS
0.4000 mg | ORAL_CAPSULE | Freq: Every day | ORAL | Status: DC
  Administered 2023-07-21 – 2023-07-22 (×2): 0.4 mg via ORAL
  Filled 2023-07-21 (×2): qty 1

## 2023-07-21 MED ORDER — SIMVASTATIN 20 MG PO TABS
40.0000 mg | ORAL_TABLET | Freq: Every day | ORAL | Status: DC
  Filled 2023-07-21: qty 2

## 2023-07-21 MED ORDER — CELECOXIB 200 MG PO CAPS
200.0000 mg | ORAL_CAPSULE | Freq: Two times a day (BID) | ORAL | Status: DC
  Administered 2023-07-21 – 2023-07-22 (×2): 200 mg via ORAL
  Filled 2023-07-21 (×3): qty 1

## 2023-07-21 MED ORDER — CLOPIDOGREL BISULFATE 75 MG PO TABS
75.0000 mg | ORAL_TABLET | Freq: Every day | ORAL | Status: DC
  Administered 2023-07-22: 75 mg via ORAL
  Filled 2023-07-21: qty 1

## 2023-07-21 MED ORDER — ACETAMINOPHEN 325 MG PO TABS: 650.0000 mg | ORAL_TABLET | ORAL | Status: DC | PRN

## 2023-07-21 MED ORDER — SODIUM CHLORIDE 0.9 % IV SOLN: INTRAVENOUS | Status: DC

## 2023-07-21 MED ORDER — ATORVASTATIN CALCIUM 40 MG PO TABS
40.0000 mg | ORAL_TABLET | Freq: Every day | ORAL | Status: DC
  Administered 2023-07-22: 40 mg via ORAL
  Filled 2023-07-21: qty 1

## 2023-07-21 MED ORDER — HYDROCODONE-ACETAMINOPHEN 5-325 MG PO TABS
1.0000 | ORAL_TABLET | ORAL | Status: DC | PRN
  Administered 2023-07-21 (×2): 1 via ORAL
  Filled 2023-07-21 (×2): qty 1

## 2023-07-21 MED ORDER — ASPIRIN 325 MG PO TABS: 325.0000 mg | ORAL_TABLET | Freq: Every day | ORAL | Status: DC

## 2023-07-21 MED ORDER — AMLODIPINE BESYLATE 5 MG PO TABS
5.0000 mg | ORAL_TABLET | Freq: Every day | ORAL | Status: DC
  Administered 2023-07-21 – 2023-07-22 (×2): 5 mg via ORAL
  Filled 2023-07-21 (×2): qty 1

## 2023-07-21 NOTE — Progress Notes (Signed)
 Patient seen and examined.  Wife was at the bedside.  Patient was emotional and sad.   He is still in the emergency room.  Admitted early stroke hours by nighttime hospitalist with brief summary, 78 year old man with history of hyperlipidemia, BPH complained of left-sided weakness that was on and off for 1 day.  Came to the emergency room.  CT angiogram showed occlusion of the left posterior cerebral artery and corresponding acute lacunar infarct of the lateral right thalamus, right hippocampal formation.  Mild left hemiparesis on presentation.  Seen by neurology and admitted for stroke workup.  Acute right MCA stroke: Patient is clinically improving.  Needs to work with PT OT.  Started on dual antiplatelet therapy with aspirin  and Plavix.  Also on simvastatin .  Echocardiogram pending.  Neurology to follow-up.  Needs to stay today in the hospital, anticipate going home tomorrow and may benefit with outpatient therapies.  Same-day admit.  No charge visit.

## 2023-07-21 NOTE — Progress Notes (Signed)
   07/21/23 1541  TOC Brief Assessment  Insurance and Status Reviewed Administrator Medicare HMO/PPO)  Patient has primary care physician Yes (Sparks, Rosalynn Come, MD)  Home environment has been reviewed From home with Spouse  Prior level of function: Independent  Prior/Current Home Services  (Ha[facility] had [facility])  Social Drivers of Health Review SDOH reviewed no interventions necessary  Readmission risk has been reviewed Yes (9%)  Transition of care needs no transition of care needs at this time   [facility] will continue to follow patient for any discharge needs

## 2023-07-21 NOTE — ED Provider Notes (Signed)
 [place] EMERGENCY DEPARTMENT AT [facility] Provider Note   CSN: [id number] Arrival date & time: 07/21/23  0013     History  No chief complaint on file.   George Bruce is a 78 y.o. male.  HPI     This is an 78 year old male who presents from [facility] after likely having a stroke.  Initial presenting complaint was left-sided weakness and decreased sensation.  Had some left arm drift.  Imaging showed a PCA occlusion.  Will be admitted to the hospitalist.  Plan was for transfer here for close monitoring if he were to worsen.  Patient states that he is not having any further complaints.  Denies any worsening of symptoms.  Denies any vision changes.  Neurology and hospitalist advised of patient arrival.  Home Medications Prior to Admission medications   Medication Sig Start Date End Date Taking? Authorizing Provider  finasteride  (PROSCAR ) 5 MG tablet TAKE ONE TABLET (5 MG) BY MOUTH EVERY DAY 03/05/23   Stoioff, Kizzie Perks, MD  HYDROcodone -acetaminophen  (NORCO/VICODIN) 5-325 MG tablet Take 1 tablet by mouth every 6 (six) hours as needed for moderate pain (pain score 4-6). 05/05/23   Coralyn Derry, PA-C  Multiple Vitamin (MULTIVITAMIN WITH MINERALS) TABS tablet Take 1 tablet by mouth daily.    [provider]  naproxen (NAPROSYN) 500 MG tablet Take 500 mg by mouth 2 (two) times daily with a meal.    [provider]  sildenafil  (REVATIO ) 20 MG tablet TAKE 3 TO 5 TABLETS EVERY DAY AS NEEDED 01/15/21   [provider]  simvastatin  (ZOCOR ) 40 MG tablet Take 40 mg by mouth daily at 6 PM.    [provider]  tamsulosin  (FLOMAX ) 0.4 MG CAPS capsule TAKE ONE CAPSULE DAILY 30 MINS AFTER THE SAME MEAL EACH DAY. 07/20/23   Stoioff, Kizzie Perks, MD      Allergies    Iodine    Review of Systems   Review of Systems  Neurological:  Positive for weakness and numbness. Negative for light-headedness and headaches.  All other systems reviewed and are  negative.   Physical Exam Updated Vital Signs BP (!) 152/95   Pulse (!) 53   Temp 98 F (36.7 C)   Resp 18   SpO2 96%  Physical Exam Vitals and nursing note reviewed.  Constitutional:      Appearance: He is well-developed. He is not ill-appearing.  HENT:     Head: Normocephalic and atraumatic.  Eyes:     Pupils: Pupils are equal, round, and reactive to light.  Cardiovascular:     Rate and Rhythm: Normal rate and regular rhythm.  Pulmonary:     Effort: Pulmonary effort is normal. No respiratory distress.  Abdominal:     Palpations: Abdomen is soft.     Tenderness: There is no abdominal tenderness.  Musculoskeletal:     Cervical back: Neck supple.  Lymphadenopathy:     Cervical: No cervical adenopathy.  Skin:    General: Skin is warm and dry.  Neurological:     Mental Status: He is alert and oriented to person, place, and time.     Comments: Full neurologic exam deferred, see prior notes  Psychiatric:        Mood and Affect: Mood normal.     ED Results / Procedures / Treatments   Labs (all labs ordered are listed, but only abnormal results are displayed) Labs Reviewed - No data to display  EKG None  Radiology CT CEREBRAL PERFUSION  W CONTRAST Result Date: 07/20/2023 CLINICAL DATA:  Stroke EXAM: CT PERFUSION BRAIN TECHNIQUE: Multiphase CT imaging of the brain was performed following IV bolus contrast injection. Subsequent parametric perfusion maps were calculated using RAPID software. RADIATION DOSE REDUCTION: This exam was performed according to the departmental dose-optimization program which includes automated exposure control, adjustment of the mA and/or kV according to patient size and/or use of iterative reconstruction technique. CONTRAST:  50mL OMNIPAQUE  IOHEXOL  350 MG/ML SOLN COMPARISON:  None Available. FINDINGS: CT Brain Perfusion Findings: CBF (<30%) Volume: 0mL Perfusion (Tmax>6.0s) volume: 0mL Mismatch Volume: 0mL Infarct Core: 0 mL Infarction Location:None  IMPRESSION: No emergent large vessel occlusion or high-grade stenosis of the intracranial arteries. Electronically Signed   By: Juanetta Nordmann M.D.   On: 07/20/2023 19:44   CT HEAD CODE STROKE WO CONTRAST Result Date: 07/20/2023 CLINICAL DATA:  Code stroke. Neuro deficit, acute, stroke suspected. EXAM: CT ANGIOGRAPHY HEAD AND NECK TECHNIQUE: Multidetector CT imaging of the head and neck was performed using the standard protocol during bolus administration of intravenous contrast. Multiplanar CT image reconstructions and MIPs were obtained to evaluate the vascular anatomy. Carotid stenosis measurements (when applicable) are obtained utilizing NASCET criteria, using the distal internal carotid diameter as the denominator. RADIATION DOSE REDUCTION: This exam was performed according to the departmental dose-optimization program which includes automated exposure control, adjustment of the mA and/or kV according to patient size and/or use of iterative reconstruction technique. CONTRAST:  75mL OMNIPAQUE  IOHEXOL  350 MG/ML SOLN COMPARISON:  Brain MRI 02/22/2008. FINDINGS: CT HEAD FINDINGS Brain: Generalized cerebral atrophy. 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 Mild patchy and ill-defined hypoattenuation within the cerebral white matter, nonspecific but compatible with chronic small vessel ischemic disease. There is no acute intracranial hemorrhage. No demarcated cortical infarct. No extra-axial fluid collection. No evidence of an intracranial mass. No midline shift. Vascular: No hyperdense vessel. Atherosclerotic calcifications. Skull: No calvarial fracture or aggressive osseous lesion. Sinuses/Orbits: No orbital mass or acute orbital finding. No significant paranasal sinus disease. ASPECTS (Alberta Stroke Program Early CT Score) - Ganglionic level infarction (caudate, lentiform nuclei, internal capsule, insula, M1-M3 cortex): 7 - Supraganglionic infarction (M4-M6 cortex): 3 Total  score (0-10 with 10 being normal): 10 Review of the MIP images confirms the above findings No noncontrast CT evidence of an acute intracranial mallet. These results were called by telephone at the time of interpretation on 07/20/2023 at 7:06 pm to provider DAVID [facility] , who verbally acknowledged these results. CTA NECK FINDINGS Aortic arch: Streak/beam hardening artifact arising from a dense contrast bolus partially obscures the aortic arch and bilateral subclavian arteries. Within this limitation, findings are as follows. Atherosclerotic plaque within the visualized aortic arch and proximal major branch vessels of the neck. No appreciable hemodynamically significant innominate or proximal subclavian artery stenosis. Right carotid system: CCA and ICA patent within the neck without hemodynamically significant stenosis (50% or greater). Mild atherosclerotic plaque about the carotid bifurcation and within the proximal ICA. Left carotid system: CCA and ICA patent within the neck. A sclerotic plaque about the carotid bifurcation and within the proximal ICA resulting in less than 50% stenosis of the proximal ICA Vertebral arteries: Codominant and patent within the neck without stenosis. Skeleton: Grade 1 anterolisthesis at C2-C3, C3-C4, C4-C5, C7-T1, T1-T2 and T2-T3. Spondylosis at the cervical and visualized upper thoracic levels. Other neck: No neck mass or cervical lymphadenopathy. Upper chest: No consolidation within the imaged lung apices. Review of the MIP images confirms the above findings CTA HEAD  FINDINGS Anterior circulation: The intracranial internal carotid arteries are patent. Atherosclerotic plaque within both vessels with no more than mild stenosis. The M1 middle cerebral arteries are patent. No M2 proximal branch occlusion or high-grade proximal stenosis. The anterior cerebral arteries are patent. Hypoplastic right A1 segment. No intracranial aneurysm is identified. Posterior circulation: The intracranial  vertebral arteries are patent. The basilar artery is patent. The right posterior cerebral artery is occluded proximally (beginning at the P1/P2 junction (series 10, image 20). There is some reconstitution of enhancement within right PCA branches at the distal P2 segment level. Left PCA is patent. 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 A left posterior communicating artery is present. The right posterior communicating artery is diminutive or absent. Venous sinuses: Within the limitations of contrast timing, no convincing thrombus. Anatomic variants: As described. 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 This result was called by telephone at the time of interpretation on 07/20/2023 at 7:07 pm to provider DAVID [facility] , who verbally acknowledged these results. IMPRESSION: Non-contrast head CT: 1. No acute intracranial hemorrhage or evidence of an acute infarct. 2. 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 3. Background parenchymal atrophy and chronic small vessel ischemic disease. CTA neck: 1. Streak/beam hardening artifact arising from a dense contrast bolus partially obscures the aortic arch and bilateral subclavian arteries. 2. The common carotid and internal carotid arteries are patent within the neck. Atherosclerotic plaque bilaterally, as described. Most notably, atherosclerotic plaque results in less than 50% stenosis of the proximal left internal carotid artery. 3. The vertebral arteries are patent within the neck without stenosis. 4. Aortic Atherosclerosis (ICD10-I70.0). CTA head: 1. 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 There is some reconstitution of enhancement within  right posterior cerebral artery branches beginning at the distal P2 segment level. 2. 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 3. Atherosclerotic plaque within the intracranial internal carotid arteries with no more than mild stenosis. Electronically Signed   By: Bascom Lily D.O.   On: 07/20/2023 19:32   CT ANGIO HEAD NECK W WO CM (CODE STROKE) Result Date: 07/20/2023 CLINICAL DATA:  Code stroke. Neuro deficit, acute, stroke suspected. EXAM: CT ANGIOGRAPHY HEAD AND NECK TECHNIQUE: Multidetector CT imaging of the head and neck was performed using the standard protocol during bolus administration of intravenous contrast. Multiplanar CT image reconstructions and MIPs were obtained to evaluate the vascular anatomy. Carotid stenosis measurements (when applicable) are obtained utilizing NASCET criteria, using the distal internal carotid diameter as the denominator. RADIATION DOSE REDUCTION: This exam was performed according to the departmental dose-optimization program which includes automated exposure control, adjustment of the mA and/or kV according to patient size and/or use of iterative reconstruction technique. CONTRAST:  75mL OMNIPAQUE  IOHEXOL  350 MG/ML SOLN COMPARISON:  Brain MRI 02/22/2008. FINDINGS: CT HEAD FINDINGS Brain: Generalized cerebral atrophy. 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 Mild patchy and ill-defined hypoattenuation within the cerebral white matter, nonspecific but compatible with chronic small vessel ischemic disease. There is no acute intracranial hemorrhage. No demarcated cortical infarct. No extra-axial fluid collection. No evidence of an intracranial mass. No midline shift.  Vascular: No hyperdense vessel. Atherosclerotic calcifications. Skull: No calvarial fracture or aggressive osseous lesion. Sinuses/Orbits: No orbital mass or acute orbital finding. No significant paranasal  sinus disease. ASPECTS (Alberta Stroke Program Early CT Score) - Ganglionic level infarction (caudate, lentiform nuclei, internal capsule, insula, M1-M3 cortex): 7 - Supraganglionic infarction (M4-M6 cortex): 3 Total score (0-10 with 10 being normal): 10 Review of the MIP images confirms the above findings No noncontrast CT evidence of an acute intracranial mallet. These results were called by telephone at the time of interpretation on 07/20/2023 at 7:06 pm to provider DAVID [facility] , who verbally acknowledged these results. CTA NECK FINDINGS Aortic arch: Streak/beam hardening artifact arising from a dense contrast bolus partially obscures the aortic arch and bilateral subclavian arteries. Within this limitation, findings are as follows. Atherosclerotic plaque within the visualized aortic arch and proximal major branch vessels of the neck. No appreciable hemodynamically significant innominate or proximal subclavian artery stenosis. Right carotid system: CCA and ICA patent within the neck without hemodynamically significant stenosis (50% or greater). Mild atherosclerotic plaque about the carotid bifurcation and within the proximal ICA. Left carotid system: CCA and ICA patent within the neck. A sclerotic plaque about the carotid bifurcation and within the proximal ICA resulting in less than 50% stenosis of the proximal ICA Vertebral arteries: Codominant and patent within the neck without stenosis. Skeleton: Grade 1 anterolisthesis at C2-C3, C3-C4, C4-C5, C7-T1, T1-T2 and T2-T3. Spondylosis at the cervical and visualized upper thoracic levels. Other neck: No neck mass or cervical lymphadenopathy. Upper chest: No consolidation within the imaged lung apices. Review of the MIP images confirms the above findings CTA HEAD FINDINGS Anterior circulation: The intracranial internal carotid arteries are patent. Atherosclerotic plaque within both vessels with no more than mild stenosis. The M1 middle cerebral arteries are patent. No  M2 proximal branch occlusion or high-grade proximal stenosis. The anterior cerebral arteries are patent. Hypoplastic right A1 segment. No intracranial aneurysm is identified. Posterior circulation: The intracranial vertebral arteries are patent. The basilar artery is patent. The right posterior cerebral artery is occluded proximally (beginning at the P1/P2 junction (series 10, image 20). There is some reconstitution of enhancement within right PCA branches at the distal P2 segment level. Left PCA is patent. 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 A left posterior communicating artery is present. The right posterior communicating artery is diminutive or absent. Venous sinuses: Within the limitations of contrast timing, no convincing thrombus. Anatomic variants: As described. 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 This result was called by telephone at the time of interpretation on 07/20/2023 at 7:07 pm to provider DAVID [facility] , who verbally acknowledged these results. IMPRESSION: Non-contrast head CT: 1. No acute intracranial hemorrhage or evidence of an acute infarct. 2. 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 3. Background parenchymal atrophy and chronic small vessel ischemic disease. CTA neck: 1. Streak/beam hardening artifact arising from a dense contrast bolus partially obscures the aortic arch and bilateral subclavian arteries. 2. The common carotid and internal carotid arteries are patent within the neck. Atherosclerotic plaque bilaterally, as described. Most notably, atherosclerotic plaque results in less than 50% stenosis of the proximal left internal carotid artery. 3. The vertebral arteries are patent within the neck without stenosis. 4. Aortic Atherosclerosis  (ICD10-I70.0). CTA head: 1.  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 There is some reconstitution of enhancement within right posterior cerebral artery branches beginning at the distal P2 segment level. 2. 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 3. Atherosclerotic plaque within the intracranial internal carotid arteries with no more than mild stenosis. Electronically Signed   By: Bascom Lily D.O.   On: 07/20/2023 19:32    Procedures Procedures    Medications Ordered in ED Medications - No data to display  ED Course/ Medical Decision Making/ A&P Clinical Course as of 07/21/23 0123  Tue Jul 21, 2023  0122 Spoke with Dr. Alecia Ames.  No intervention.  He will evaluate the patient. [CH]    Clinical Course User Index [CH] Sekai Gitlin, Vonzella Guernsey, MD                                 Medical Decision Making Risk Decision regarding hospitalization.   This patient presents to the ED for concern of CVA, this involves an extensive number of treatment options, and is a complaint that carries with it a high risk of complications and morbidity.  I considered the following differential and admission for this acute, potentially life threatening condition.  The differential diagnosis includes CVA  MDM:    This an 78 year old male who was transferred from [facility] for hospitalist admission for monitoring regarding stroke.  Found to have stroke on imaging.  Initial presenting complaints left arm numbness and weakness.  No further complaints.  Neurology and hospitalist advised of his arrival.  (Labs, imaging, consults)  Labs: I Ordered, and personally interpreted labs.  The pertinent results include: None  Imaging Studies ordered: I ordered imaging studies including imaging reviewed I independently visualized and interpreted imaging. I  agree with the radiologist interpretation  Additional history obtained from chart review.  External records from outside source obtained and reviewed including prior evaluations  Cardiac Monitoring: The patient was maintained on a cardiac monitor.  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   I have reviewed the patients home medicines and have made adjustments as needed  Problem List / ED Course: Problem List Items Addressed This Visit   None Visit Diagnoses       Cerebrovascular accident (CVA), unspecified mechanism (HCC)    -  Primary                   Final Clinical Impression(s) / ED Diagnoses Final diagnoses:  Cerebrovascular accident (CVA), unspecified  mechanism [facility])    Rx / DC Orders ED Discharge Orders     None         Leinaala Catanese, Vonzella Guernsey, MD 07/21/23 [phone]

## 2023-07-21 NOTE — Progress Notes (Addendum)
 STROKE TEAM PROGRESS NOTE   INTERIM HISTORY/SUBJECTIVE  States that yesterday after lunch he became dizzy and then had trouble using his left arm and his left leg got weak.  His wife is at the bedside. 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  CBC    Component Value Date/Time   WBC 10.3 07/21/2023 0508   RBC 4.87 07/21/2023 0508   HGB 14.4 07/21/2023 0508   HCT 42.8 07/21/2023 0508   PLT 154 07/21/2023 0508   MCV 87.9 07/21/2023 0508   MCH 29.6 07/21/2023 0508   MCHC 33.6 07/21/2023 0508   RDW 13.9 07/21/2023 0508   LYMPHSABS 1.3 07/20/2023 1829   MONOABS 0.5 07/20/2023 1829   EOSABS 0.0 07/20/2023 1829   BASOSABS 0.0 07/20/2023 1829    BMET    Component Value Date/Time   NA 141 07/21/2023 0508   K 3.9 07/21/2023 0508   CL 109 07/21/2023 0508   CO2 25 07/21/2023 0508   GLUCOSE 85 07/21/2023 0508   BUN 18 07/21/2023 0508   CREATININE 1.04 07/21/2023 0508   CALCIUM 8.3 (L) 07/21/2023 0508   GFRNONAA >60 07/21/2023 0508    IMAGING past 24 hours MR BRAIN WO CONTRAST Result Date: 07/21/2023 CLINICAL DATA:  78 year old male with neurologic deficit. Code stroke presentation yesterday. EXAM: MRI HEAD WITHOUT CONTRAST TECHNIQUE: Multiplanar, multiecho pulse sequences of the brain and surrounding structures were obtained without intravenous contrast. COMPARISON:  CT head, CTA, CTP yesterday.  Brain MRI 02/22/2008. FINDINGS: Brain: Asymmetric, abnormal diffusion in the right hippocampal formation including the body and tail. But also patchy restricted diffusion in the lateral right thalamus near the posterior limb external capsule (series 9, image 76, series 10, image 26). Patchy T2 and FLAIR hyperintensity in both areas. No acute hemorrhage or mass effect. No other abnormal diffusion in the right PCA territory. Questionable faint abnormal diffusion also in the right  posterior corona radiata on series 9, image 81 although might be T2 shine through. Nearby chronic lacunar infarct of the right caudate. Cerebral volume loss since 2009 appears fairly generalized. Progressed T2 heterogeneity in the bilateral deep gray nuclei since that time. And scattered increased bilateral cerebral white matter T2 and FLAIR hyperintensity. But no cortical encephalomalacia and no chronic cerebral blood products identified. Brainstem and cerebellum remain normal for age. No midline shift, mass effect, evidence of mass lesion, ventriculomegaly, extra-axial collection or acute intracranial hemorrhage. Cervicomedullary junction and pituitary are within normal limits. Vascular: Major intracranial vascular flow voids are stable since 2009. Skull and upper cervical spine: Visualized bone marrow signal is within normal limits. Partially visible cervical disc protrusion at C4-C5 on series 13, image 13. Negative for age upper cervical spine otherwise. Sinuses/Orbits: Postoperative changes to both globes since 2009, otherwise stable and negative. Other: Mastoids are clear. Grossly normal visible internal auditory structures. Negative visible scalp and face. IMPRESSION: 1. Positive for Acute lacunar infarct of the lateral Right Thalamus, but also abnormal diffusion throughout the right hippocampal formation. Both are Right PCA territory, although superimposed seizure related changes of the right hippocampus would be difficult to exclude. No associated hemorrhage or mass effect. 2. 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 No other acute intracranial abnormality identified. Electronically Signed   By: Marlise Simpers M.D.   On: 07/21/2023  05:51   CT CEREBRAL PERFUSION W CONTRAST Result Date: 07/20/2023 CLINICAL DATA:  Stroke EXAM: CT PERFUSION BRAIN TECHNIQUE: Multiphase CT imaging of the brain was performed  following IV bolus contrast injection. Subsequent parametric perfusion maps were calculated using RAPID software. RADIATION DOSE REDUCTION: This exam was performed according to the departmental dose-optimization program which includes automated exposure control, adjustment of the mA and/or kV according to patient size and/or use of iterative reconstruction technique. CONTRAST:  50mL OMNIPAQUE  IOHEXOL  350 MG/ML SOLN COMPARISON:  None Available. FINDINGS: CT Brain Perfusion Findings: CBF (<30%) Volume: 0mL Perfusion (Tmax>6.0s) volume: 0mL Mismatch Volume: 0mL Infarct Core: 0 mL Infarction Location:None IMPRESSION: No emergent large vessel occlusion or high-grade stenosis of the intracranial arteries. Electronically Signed   By: Juanetta Nordmann M.D.   On: 07/20/2023 19:44   CT HEAD CODE STROKE WO CONTRAST Result Date: 07/20/2023 CLINICAL DATA:  Code stroke. Neuro deficit, acute, stroke suspected. EXAM: CT ANGIOGRAPHY HEAD AND NECK TECHNIQUE: Multidetector CT imaging of the head and neck was performed using the standard protocol during bolus administration of intravenous contrast. Multiplanar CT image reconstructions and MIPs were obtained to evaluate the vascular anatomy. Carotid stenosis measurements (when applicable) are obtained utilizing NASCET criteria, using the distal internal carotid diameter as the denominator. RADIATION DOSE REDUCTION: This exam was performed according to the departmental dose-optimization program which includes automated exposure control, adjustment of the mA and/or kV according to patient size and/or use of iterative reconstruction technique. CONTRAST:  75mL OMNIPAQUE  IOHEXOL  350 MG/ML SOLN COMPARISON:  Brain MRI 02/22/2008. FINDINGS: CT HEAD FINDINGS Brain: Generalized cerebral atrophy. 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 Mild patchy and ill-defined hypoattenuation within the cerebral white matter, nonspecific but compatible with chronic small  vessel ischemic disease. There is no acute intracranial hemorrhage. No demarcated cortical infarct. No extra-axial fluid collection. No evidence of an intracranial mass. No midline shift. Vascular: No hyperdense vessel. Atherosclerotic calcifications. Skull: No calvarial fracture or aggressive osseous lesion. Sinuses/Orbits: No orbital mass or acute orbital finding. No significant paranasal sinus disease. ASPECTS (Alberta Stroke Program Early CT Score) - Ganglionic level infarction (caudate, lentiform nuclei, internal capsule, insula, M1-M3 cortex): 7 - Supraganglionic infarction (M4-M6 cortex): 3 Total score (0-10 with 10 being normal): 10 Review of the MIP images confirms the above findings No noncontrast CT evidence of an acute intracranial mallet. These results were called by telephone at the time of interpretation on 07/20/2023 at 7:06 pm to provider DAVID [facility] , who verbally acknowledged these results. CTA NECK FINDINGS Aortic arch: Streak/beam hardening artifact arising from a dense contrast bolus partially obscures the aortic arch and bilateral subclavian arteries. Within this limitation, findings are as follows. Atherosclerotic plaque within the visualized aortic arch and proximal major branch vessels of the neck. No appreciable hemodynamically significant innominate or proximal subclavian artery stenosis. Right carotid system: CCA and ICA patent within the neck without hemodynamically significant stenosis (50% or greater). Mild atherosclerotic plaque about the carotid bifurcation and within the proximal ICA. Left carotid system: CCA and ICA patent within the neck. A sclerotic plaque about the carotid bifurcation and within the proximal ICA resulting in less than 50% stenosis of the proximal ICA Vertebral arteries: Codominant and patent within the neck without stenosis. Skeleton: Grade 1 anterolisthesis at C2-C3, C3-C4, C4-C5, C7-T1, T1-T2 and T2-T3. Spondylosis at the cervical and visualized upper thoracic  levels. Other neck: No neck mass or cervical lymphadenopathy. Upper chest: No consolidation within the imaged lung apices. Review of the MIP  images confirms the above findings CTA HEAD FINDINGS Anterior circulation: The intracranial internal carotid arteries are patent. Atherosclerotic plaque within both vessels with no more than mild stenosis. The M1 middle cerebral arteries are patent. No M2 proximal branch occlusion or high-grade proximal stenosis. The anterior cerebral arteries are patent. Hypoplastic right A1 segment. No intracranial aneurysm is identified. Posterior circulation: The intracranial vertebral arteries are patent. The basilar artery is patent. The right posterior cerebral artery is occluded proximally (beginning at the P1/P2 junction (series 10, image 20). There is some reconstitution of enhancement within right PCA branches at the distal P2 segment level. Left PCA is patent. 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 A left posterior communicating artery is present. The right posterior communicating artery is diminutive or absent. Venous sinuses: Within the limitations of contrast timing, no convincing thrombus. Anatomic variants: As described. 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 This result was called by telephone at the time of interpretation on 07/20/2023 at 7:07 pm to provider DAVID [facility] , who verbally acknowledged these results. IMPRESSION: Non-contrast head CT: 1. No acute intracranial hemorrhage or evidence of an acute infarct. 2. 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 3. Background parenchymal atrophy and chronic small vessel ischemic disease. CTA neck: 1. Streak/beam hardening artifact arising from a dense contrast bolus partially obscures the  aortic arch and bilateral subclavian arteries. 2. The common carotid and internal carotid arteries are patent within the neck. Atherosclerotic plaque bilaterally, as described. Most notably, atherosclerotic plaque results in less than 50% stenosis of the proximal left internal carotid artery. 3. The vertebral arteries are patent within the neck without stenosis. 4. Aortic Atherosclerosis (ICD10-I70.0). CTA head: 1. 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 There is some reconstitution of enhancement within right posterior cerebral artery branches beginning at the distal P2 segment level. 2. 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 3. Atherosclerotic plaque within the intracranial internal carotid arteries with no more than mild stenosis. Electronically Signed   By: Bascom Lily D.O.   On: 07/20/2023 19:32   CT ANGIO HEAD NECK W WO CM (CODE STROKE) Result Date: 07/20/2023 CLINICAL DATA:  Code stroke. Neuro deficit, acute, stroke suspected. EXAM: CT ANGIOGRAPHY HEAD AND NECK TECHNIQUE: Multidetector CT imaging of the head and neck was performed using the standard protocol during bolus administration of intravenous contrast. Multiplanar CT image reconstructions and MIPs were obtained to evaluate the vascular anatomy. Carotid stenosis measurements (when applicable) are obtained utilizing NASCET criteria, using the distal internal carotid diameter as the denominator. RADIATION DOSE REDUCTION: This exam was performed according to the departmental dose-optimization program which includes automated exposure control, adjustment of the mA and/or kV according to patient size and/or use of iterative reconstruction technique. CONTRAST:  75mL OMNIPAQUE  IOHEXOL  350 MG/ML SOLN COMPARISON:  Brain MRI 02/22/2008. FINDINGS: CT HEAD FINDINGS Brain: Generalized  cerebral atrophy. 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 Mild patchy and ill-defined hypoattenuation within the cerebral white matter, nonspecific but compatible with chronic small vessel ischemic disease. There is no acute intracranial hemorrhage. No demarcated cortical infarct. No extra-axial fluid collection. No evidence  of an intracranial mass. No midline shift. Vascular: No hyperdense vessel. Atherosclerotic calcifications. Skull: No calvarial fracture or aggressive osseous lesion. Sinuses/Orbits: No orbital mass or acute orbital finding. No significant paranasal sinus disease. ASPECTS ([place]berta Stroke Program Early CT Score) - Ganglionic level infarction (caudate, lentiform nuclei, internal capsule, insula, M1-M3 cortex): 7 - Supraganglionic infarction (M4-M6 cortex): 3 Total score (0-10 with 10 being normal): 10 Review of the MIP images confirms the above findings No noncontrast CT evidence of an acute intracranial mallet. These results were called by telephone at the time of interpretation on 07/20/2023 at 7:06 pm to provider DAVID [facility] , who verbally acknowledged these results. CTA NECK FINDINGS Aortic arch: Streak/beam hardening artifact arising from a dense contrast bolus partially obscures the aortic arch and bilateral subclavian arteries. Within this limitation, findings are as follows. Atherosclerotic plaque within the visualized aortic arch and proximal major branch vessels of the neck. No appreciable hemodynamically significant innominate or proximal subclavian artery stenosis. Right carotid system: CCA and ICA patent within the neck without hemodynamically significant stenosis (50% or greater). Mild atherosclerotic plaque about the carotid bifurcation and within the proximal ICA. Left carotid system: CCA and ICA patent within the neck. A sclerotic plaque about the carotid bifurcation and within the proximal ICA resulting in less than 50% stenosis of the proximal  ICA Vertebral arteries: Codominant and patent within the neck without stenosis. Skeleton: Grade 1 anterolisthesis at C2-C3, C3-C4, C4-C5, C7-T1, T1-T2 and T2-T3. Spondylosis at the cervical and visualized upper thoracic levels. Other neck: No neck mass or cervical lymphadenopathy. Upper chest: No consolidation within the imaged lung apices. Review of the MIP images confirms the above findings CTA HEAD FINDINGS Anterior circulation: The intracranial internal carotid arteries are patent. Atherosclerotic plaque within both vessels with no more than mild stenosis. The M1 middle cerebral arteries are patent. No M2 proximal branch occlusion or high-grade proximal stenosis. The anterior cerebral arteries are patent. Hypoplastic right A1 segment. No intracranial aneurysm is identified. Posterior circulation: The intracranial vertebral arteries are patent. The basilar artery is patent. The right posterior cerebral artery is occluded proximally (beginning at the P1/P2 junction (series 10, image 20). There is some reconstitution of enhancement within right PCA branches at the distal P2 segment level. Left PCA is patent. 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 A left posterior communicating artery is present. The right posterior communicating artery is diminutive or absent. Venous sinuses: Within the limitations of contrast timing, no convincing thrombus. Anatomic variants: As described. 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 This result was called by telephone at the time of interpretation on 07/20/2023 at 7:07 pm to provider DAVID [facility] , who verbally acknowledged these results. IMPRESSION: Non-contrast head CT: 1. No acute intracranial hemorrhage or evidence of an acute infarct. 2. Chronic lacunar infarct again  demonstrated within the right caudate nucleus. 3. Background parenchymal atrophy and chronic small vessel ischemic disease. CTA neck: 1. Streak/beam hardening artifact arising from a dense contrast bolus partially obscures the aortic arch and bilateral subclavian arteries. 2. The common carotid and internal carotid arteries are patent within the neck. Atherosclerotic plaque bilaterally, as described. Most notably, atherosclerotic plaque results in less than 50% stenosis of the proximal left internal carotid artery. 3. The vertebral arteries are patent within the neck without stenosis. 4.  Aortic Atherosclerosis (ICD10-I70.0). CTA head: 1. 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 There is some reconstitution of enhancement within right posterior cerebral artery branches beginning at the distal P2 segment level. 2. 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 3. Atherosclerotic plaque within the intracranial internal carotid arteries with no more than mild stenosis. Electronically Signed   By: Bascom Lily D.O.   On: 07/20/2023 19:32    Vitals:   07/21/23 1008 07/21/23 1200 07/21/23 1300 07/21/23 1317  BP:  (!) 185/95 (!) 171/94   Pulse:  (!) 59 67   Resp:  16 17   Temp: 98.5 F (36.9 C)   98.2 F (36.8 C)  TempSrc: Temporal   Temporal  SpO2:  98% 98%      PHYSICAL EXAM General:  Alert, well-nourished, well-developed patient in no acute distress Psych:  Mood and affect appropriate for situation CV: Regular rate and rhythm on monitor Respiratory:  Regular, unlabored respirations on room air GI: Abdomen soft and nontender   NEURO:  Mental Status: AA&Ox3, patient is able to give clear and coherent history Speech/Language: speech is without dysarthria or aphasia.  Naming, repetition, fluency, and comprehension intact.  Cranial  Nerves:  II: PERRL. Visual fields full.  III, IV, VI: EOMI. Eyelids elevate symmetrically.  V: Sensation is intact to light touch and symmetrical to face.  VII: Face is symmetrical resting and smiling VIII: hearing intact to voice. IX, X: Palate elevates symmetrically. Phonation is normal.  XB:[id number] shrug 5/5. XII: tongue is midline without fasciculations. Motor: 5/5 strength in right upper, and bilateral lowers with slight drift in left lower leg.  Left arm 4/5 with drift Tone: is normal and bulk is normal Sensation-decreased sensation on the left extinction absent to light touch to DSS.   Coordination: FTN intact bilaterally, HKS: no ataxia in BLE.No drift.  Gait- deferred  Most Recent NIH 4   ASSESSMENT/PLAN  Mr. ERIS HALTEMAN is a 78 y.o. male with history of hyperlipidemia, BPH presents to the ER with complaints of left-sided weakness. 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 2D Echo pending LDL 53 HgbA1c pending VTE prophylaxis -Lovenox  No antithrombotic prior to admission, now on aspirin  81 mg daily and clopidogrel 75 mg daily for 3 months and then aspirin  alone. Therapy recommendations: Outpatient PT Disposition: Pending  Hypertension Home meds: None Stable on the high end Put on amlodipine 5 Gradually normalize BP in 2 to 3 days Long-term BP goal normotensive  Hyperlipidemia Home meds: Simvastatin  40 mg LDL 53, goal < 70 On Lipitor 40 Continue statin at discharge  Other Stroke Risk Factors ETOH  use, alcohol level <15, advised to drink no more than 2 drink(s) a day Advanced age Remote stroke on CT - chronic lacunar infarct within the right caudate nucleus.  Other Active Problems BPH Left sciatica - following with [facility] for pain management  Hospital day # 0   [facility]  Sierra Dresser, ACNPC-AG  [facility]  ATTENDING NOTE: I reviewed above note and agree with the assessment and plan. Pt was seen and examined.   Wife at bedside.  Patient reclining in bed, still has left mild hemiparesis but more pronounced left finger-to-nose ataxia, out of proportion to weakness.  Decree sensation on the left, otherwise neuro intact.  Patient still likely large vessel disease from right PCA occlusion.  Now on DAPT for 3 months and then aspirin  alone.  Change Zocor  to Lipitor 40.  BP on the higher end, started low-dose amlodipine.  PT and OT recommend outpatient.  Will follow.  For detailed assessment and plan, please refer to above as I have made changes wherever appropriate.   Consuelo Denmark, MD PhD Stroke Neurology 07/21/2023 7:10 PM    To contact Stroke Continuity provider, please refer to [email or web address]. After hours, contact General Neurology

## 2023-07-21 NOTE — H&P (Signed)
 History and Physical    George Bruce ZOX:[id number] DOB: 1945-09-13 DOA: 07/21/2023  Patient coming from: Home.  Chief Complaint: Left-sided weakness.  HPI: George Bruce is a 78 y.o. male with history of hyperlipidemia, BPH presents to the ER with complaints of left-sided weakness.  Patient states at around 1 PM yesterday after lunch patient started experiencing left upper and lower extremity weakness with patient having to drag his left lower extremity.  He took a nap and following which when he woke up he felt better.  Then around 5:30 PM he started experiencing again difficulty walking and left-sided weakness.  At this point he decided to come to the ER.  Denies any visual symptoms or any difficulty speaking or swallowing.  ED Course: In the ER CT head was unremarkable.  CT angiogram head and neck shows occlusion of the left posterior cerebral artery on the right side with severe stenosis on the left side posterior cerebral artery.  Neurologist on-call was consulted and patient is being admitted for acute CVA.  Patient [facility] to [facility].  Patient at the time of my exam states that his strength has come back to his baseline on the left side.  EKG shows normal sinus rhythm.  Basic blood work is unremarkable.  Patient was given aspirin  and Plavix.  Review of Systems: As per HPI, rest all negative.   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   Hypertension    Wears dentures    partial upper and lower    Past Surgical History:  Procedure Laterality Date   CATARACT EXTRACTION W/PHACO Right 09/29/2022   Procedure: CATARACT EXTRACTION PHACO AND INTRAOCULAR LENS PLACEMENT (IOC) RIGHT  10.98  00:56.5;  Surgeon: Rosa College, MD;  Location: [facility];  Service: Ophthalmology;  Laterality: Right;   CATARACT EXTRACTION W/PHACO Left 10/13/2022   Procedure: CATARACT EXTRACTION PHACO AND INTRAOCULAR LENS PLACEMENT (IOC) LEFT 10.79 00:54.1;  Surgeon: Rosa College, MD;  Location: [facility];  Service: Ophthalmology;  Laterality: Left;   COLONOSCOPY WITH PROPOFOL  N/A 04/21/2018   Procedure: COLONOSCOPY WITH PROPOFOL ;  Surgeon: Irby Mannan, MD;  Location: [facility];  Service: Endoscopy;  Laterality: N/A;   INGUINAL HERNIA REPAIR Left 1990's   INGUINAL HERNIA REPAIR Right 2015   REVERSE SHOULDER ARTHROPLASTY Left 11/06/2015   Procedure: REVERSE SHOULDER ARTHROPLASTY;  Surgeon: Elner Hahn, MD;  Location: [facility];  Service: Orthopedics;  Laterality: Left;   REVERSE SHOULDER ARTHROPLASTY Right 03/25/2022   Procedure: REVERSE SHOULDER ARTHROPLASTY;  Surgeon: Elner Hahn, MD;  Location: [facility];  Service: Orthopedics;  Laterality: Right;  MAKE 2ND CASE     reports that he quit smoking about 28 years ago. His smoking use included cigarettes. He has never used smokeless tobacco. He reports current alcohol use of about 7.0 standard drinks of alcohol per week. He reports that he does not use drugs.  Allergies  Allergen Reactions   Iodine Rash    topical    Family History  Problem Relation Age of Onset   Diabetes Mother     Prior to Admission medications   Medication Sig Start Date End Date  Taking? Authorizing Provider  celecoxib (CELEBREX) 200 MG capsule Take 1 capsule by mouth 2 (two) times daily. 05/11/23  Yes [provider]  finasteride  (PROSCAR ) 5 MG tablet TAKE ONE TABLET (5 MG) BY MOUTH EVERY DAY 03/05/23  Yes Stoioff, Scott C, MD  Multiple Vitamin (MULTIVITAMIN WITH MINERALS) TABS tablet Take 1 tablet by mouth daily.   Yes [provider]  predniSONE (DELTASONE) 10 MG tablet TAKE 6 TABLETS ON DAY 1 AS DIRECTED ON PACKAGE AND DECREASE BY 1 TAB EACH DAY FOR A  TOTAL OF 6 DAYS   Yes [provider]  tamsulosin  (FLOMAX ) 0.4 MG CAPS capsule TAKE ONE CAPSULE DAILY 30 MINS AFTER THE SAME MEAL EACH DAY. 07/20/23  Yes Stoioff, Kizzie Perks, MD  HYDROcodone -acetaminophen  (NORCO/VICODIN) 5-325 MG tablet Take 1 tablet by mouth every 6 (six) hours as needed for moderate pain (pain score 4-6). Patient not taking: Reported on 07/21/2023 05/05/23   Coralyn Derry, PA-C  simvastatin  (ZOCOR ) 40 MG tablet Take 40 mg by mouth daily at 6 PM.    [provider]    Physical Exam: Constitutional: Moderately built and nourished. Vitals:   07/21/23 0115 07/21/23 0130 07/21/23 0145 07/21/23 0200  BP: (!) 181/93 (!) 211/91 (!) 189/77 (!) 186/94  Pulse: (!) 53 (!) 53 (!) 53 (!) 51  Resp: 14 13 12 19   Temp:      SpO2: 94% 98% 99% 98%   Eyes: Anicteric no pallor. ENMT: No discharge from the ears eyes nose and mouth. Neck: No mass felt.  No neck rigidity. Respiratory: No rhonchi or crepitations. Cardiovascular: S1-S2 heard. Abdomen: Soft nontender bowel sound present. Musculoskeletal: No edema. Skin: No rash. Neurologic: Alert awake oriented to time place and person.  Moving all extremities 5 x 5.  No facial asymmetry tongue is midline pupils are equal reacting to light but no pronator drift. Psychiatric: Appears normal.  Normal affect.   Labs on Admission: I have personally reviewed following labs and imaging studies  CBC: Recent Labs  Lab 07/20/23 1829  WBC 10.5  NEUTROABS 8.7*  HGB 15.6  HCT 47.2  MCV 89.1  PLT 193   Basic Metabolic Panel: Recent Labs  Lab 07/20/23 1829  NA 137  K 4.3  CL 104  CO2 26  GLUCOSE 137*  BUN 28*  CREATININE 1.04  CALCIUM 9.0   GFR: Estimated Creatinine Clearance: 56.7 mL/min (by C-G formula based on SCr of 1.04 mg/dL). Liver Function Tests: Recent Labs  Lab 07/20/23 1829  AST 22  ALT 19  ALKPHOS 58  BILITOT 0.9  PROT 6.3*  ALBUMIN  3.7   No results for input(s): "LIPASE", "AMYLASE" in the last  168 hours. No results for input(s): "AMMONIA" in the last 168 hours. Coagulation Profile: Recent Labs  Lab 07/20/23 1829  INR 1.0   Cardiac Enzymes: No results for input(s): "CKTOTAL", "CKMB", "CKMBINDEX", "TROPONINI" in the last 168 hours. BNP (last 3 results) No results for input(s): "PROBNP" in the last 8760 hours. HbA1C: No results for input(s): "HGBA1C" in the last 72 hours. CBG: Recent Labs  Lab 07/20/23 1829  GLUCAP 160*   Lipid Profile: No results for input(s): "CHOL", "HDL", "LDLCALC", "TRIG", "CHOLHDL", "LDLDIRECT" in the last 72 hours. Thyroid Function Tests: No results for input(s): "TSH", "T4TOTAL", "FREET4", "T3FREE", "THYROIDAB" in the last 72 hours. Anemia Panel: No results for input(s): "VITAMINB12", "FOLATE", "FERRITIN", "TIBC", "IRON", "RETICCTPCT" in the last 72 hours. Urine analysis:    Component Value Date/Time   COLORURINE STRAW (A) 03/13/2022  1435   APPEARANCEUR CLEAR (A) 03/13/2022 1435   APPEARANCEUR Clear 03/07/2020 1017   LABSPEC 1.004 (L) 03/13/2022 1435   PHURINE 6.0 03/13/2022 1435   GLUCOSEU NEGATIVE 03/13/2022 1435   HGBUR NEGATIVE 03/13/2022 1435   BILIRUBINUR NEGATIVE 03/13/2022 1435   BILIRUBINUR Negative 03/07/2020 1017   KETONESUR NEGATIVE 03/13/2022 1435   PROTEINUR NEGATIVE 03/13/2022 1435   NITRITE NEGATIVE 03/13/2022 1435   LEUKOCYTESUR NEGATIVE 03/13/2022 1435   Sepsis Labs: @LABRCNTIP (procalcitonin:4,lacticidven:4) )No results found for this or any previous visit (from the past 240 hours).   Radiological Exams on Admission: CT CEREBRAL PERFUSION W CONTRAST Result Date: 07/20/2023 CLINICAL DATA:  Stroke EXAM: CT PERFUSION BRAIN TECHNIQUE: Multiphase CT imaging of the brain was performed following IV bolus contrast injection. Subsequent parametric perfusion maps were calculated using RAPID software. RADIATION DOSE REDUCTION: This exam was performed according to the departmental dose-optimization program which includes  automated exposure control, adjustment of the mA and/or kV according to patient size and/or use of iterative reconstruction technique. CONTRAST:  50mL OMNIPAQUE  IOHEXOL  350 MG/ML SOLN COMPARISON:  None Available. FINDINGS: CT Brain Perfusion Findings: CBF (<30%) Volume: 0mL Perfusion (Tmax>6.0s) volume: 0mL Mismatch Volume: 0mL Infarct Core: 0 mL Infarction Location:None IMPRESSION: No emergent large vessel occlusion or high-grade stenosis of the intracranial arteries. Electronically Signed   By: Juanetta Nordmann M.D.   On: 07/20/2023 19:44   CT HEAD CODE STROKE WO CONTRAST Result Date: 07/20/2023 CLINICAL DATA:  Code stroke. Neuro deficit, acute, stroke suspected. EXAM: CT ANGIOGRAPHY HEAD AND NECK TECHNIQUE: Multidetector CT imaging of the head and neck was performed using the standard protocol during bolus administration of intravenous contrast. Multiplanar CT image reconstructions and MIPs were obtained to evaluate the vascular anatomy. Carotid stenosis measurements (when applicable) are obtained utilizing NASCET criteria, using the distal internal carotid diameter as the denominator. RADIATION DOSE REDUCTION: This exam was performed according to the departmental dose-optimization program which includes automated exposure control, adjustment of the mA and/or kV according to patient size and/or use of iterative reconstruction technique. CONTRAST:  75mL OMNIPAQUE  IOHEXOL  350 MG/ML SOLN COMPARISON:  Brain MRI 02/22/2008. FINDINGS: CT HEAD FINDINGS Brain: Generalized cerebral atrophy. 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 Mild patchy and ill-defined hypoattenuation within the cerebral white matter, nonspecific but compatible with chronic small vessel ischemic disease. There is no acute intracranial hemorrhage. No demarcated cortical infarct. No extra-axial fluid collection. No evidence of an intracranial mass. No midline shift. Vascular: No hyperdense vessel. Atherosclerotic  calcifications. Skull: No calvarial fracture or aggressive osseous lesion. Sinuses/Orbits: No orbital mass or acute orbital finding. No significant paranasal sinus disease. ASPECTS (Alberta Stroke Program Early CT Score) - Ganglionic level infarction (caudate, lentiform nuclei, internal capsule, insula, M1-M3 cortex): 7 - Supraganglionic infarction (M4-M6 cortex): 3 Total score (0-10 with 10 being normal): 10 Review of the MIP images confirms the above findings No noncontrast CT evidence of an acute intracranial mallet. These results were called by telephone at the time of interpretation on 07/20/2023 at 7:06 pm to provider DAVID [facility] , who verbally acknowledged these results. CTA NECK FINDINGS Aortic arch: Streak/beam hardening artifact arising from a dense contrast bolus partially obscures the aortic arch and bilateral subclavian arteries. Within this limitation, findings are as follows. Atherosclerotic plaque within the visualized aortic arch and proximal major branch vessels of the neck. No appreciable hemodynamically significant innominate or proximal subclavian artery stenosis. Right carotid system: CCA and ICA patent within the neck without hemodynamically significant stenosis (50% or greater).  Mild atherosclerotic plaque about the carotid bifurcation and within the proximal ICA. Left carotid system: CCA and ICA patent within the neck. A sclerotic plaque about the carotid bifurcation and within the proximal ICA resulting in less than 50% stenosis of the proximal ICA Vertebral arteries: Codominant and patent within the neck without stenosis. Skeleton: Grade 1 anterolisthesis at C2-C3, C3-C4, C4-C5, C7-T1, T1-T2 and T2-T3. Spondylosis at the cervical and visualized upper thoracic levels. Other neck: No neck mass or cervical lymphadenopathy. Upper chest: No consolidation within the imaged lung apices. Review of the MIP images confirms the above findings CTA HEAD FINDINGS Anterior circulation: The intracranial  internal carotid arteries are patent. Atherosclerotic plaque within both vessels with no more than mild stenosis. The M1 middle cerebral arteries are patent. No M2 proximal branch occlusion or high-grade proximal stenosis. The anterior cerebral arteries are patent. Hypoplastic right A1 segment. No intracranial aneurysm is identified. Posterior circulation: The intracranial vertebral arteries are patent. The basilar artery is patent. The right posterior cerebral artery is occluded proximally (beginning at the P1/P2 junction (series 10, image 20). There is some reconstitution of enhancement within right PCA branches at the distal P2 segment level. Left PCA is patent. 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 A left posterior communicating artery is present. The right posterior communicating artery is diminutive or absent. Venous sinuses: Within the limitations of contrast timing, no convincing thrombus. Anatomic variants: As described. 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 This result was called by telephone at the time of interpretation on 07/20/2023 at 7:07 pm to provider DAVID [facility] , who verbally acknowledged these results. IMPRESSION: Non-contrast head CT: 1. No acute intracranial hemorrhage or evidence of an acute infarct. 2. 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 3. Background parenchymal atrophy and chronic small vessel ischemic disease. CTA neck: 1. Streak/beam hardening artifact arising from a dense contrast bolus partially obscures the aortic arch and bilateral subclavian arteries. 2. The common carotid and internal carotid arteries are patent within the neck. Atherosclerotic plaque bilaterally, as described. Most notably, atherosclerotic plaque results in less than  50% stenosis of the proximal left internal carotid artery. 3. The vertebral arteries are patent within the neck without stenosis. 4. Aortic Atherosclerosis (ICD10-I70.0). CTA head: 1. 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 There is some reconstitution of enhancement within right posterior cerebral artery branches beginning at the distal P2 segment level. 2. 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 3. Atherosclerotic plaque within the intracranial internal carotid arteries with no more than mild stenosis. Electronically Signed   By: Bascom Lily D.O.   On: 07/20/2023 19:32   CT ANGIO HEAD NECK W WO CM (CODE STROKE) Result Date: 07/20/2023 CLINICAL DATA:  Code stroke. Neuro deficit, acute, stroke suspected. EXAM: CT ANGIOGRAPHY HEAD AND NECK TECHNIQUE: Multidetector CT imaging of the head and neck was performed using the standard protocol during bolus administration of intravenous contrast. Multiplanar CT image reconstructions and MIPs were obtained to evaluate the vascular anatomy. Carotid stenosis measurements (when applicable) are obtained utilizing NASCET criteria, using the distal internal carotid diameter as the denominator. RADIATION DOSE REDUCTION: This  exam was performed according to the departmental dose-optimization program which includes automated exposure control, adjustment of the mA and/or kV according to patient size and/or use of iterative reconstruction technique. CONTRAST:  75mL OMNIPAQUE  IOHEXOL  350 MG/ML SOLN COMPARISON:  Brain MRI 02/22/2008. FINDINGS: CT HEAD FINDINGS Brain: Generalized cerebral atrophy. 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 Mild patchy and ill-defined hypoattenuation within the cerebral white matter, nonspecific but compatible with chronic small vessel ischemic  disease. There is no acute intracranial hemorrhage. No demarcated cortical infarct. No extra-axial fluid collection. No evidence of an intracranial mass. No midline shift. Vascular: No hyperdense vessel. Atherosclerotic calcifications. Skull: No calvarial fracture or aggressive osseous lesion. Sinuses/Orbits: No orbital mass or acute orbital finding. No significant paranasal sinus disease. ASPECTS (Alberta Stroke Program Early CT Score) - Ganglionic level infarction (caudate, lentiform nuclei, internal capsule, insula, M1-M3 cortex): 7 - Supraganglionic infarction (M4-M6 cortex): 3 Total score (0-10 with 10 being normal): 10 Review of the MIP images confirms the above findings No noncontrast CT evidence of an acute intracranial mallet. These results were called by telephone at the time of interpretation on 07/20/2023 at 7:06 pm to provider DAVID [facility] , who verbally acknowledged these results. CTA NECK FINDINGS Aortic arch: Streak/beam hardening artifact arising from a dense contrast bolus partially obscures the aortic arch and bilateral subclavian arteries. Within this limitation, findings are as follows. Atherosclerotic plaque within the visualized aortic arch and proximal major branch vessels of the neck. No appreciable hemodynamically significant innominate or proximal subclavian artery stenosis. Right carotid system: CCA and ICA patent within the neck without hemodynamically significant stenosis (50% or greater). Mild atherosclerotic plaque about the carotid bifurcation and within the proximal ICA. Left carotid system: CCA and ICA patent within the neck. A sclerotic plaque about the carotid bifurcation and within the proximal ICA resulting in less than 50% stenosis of the proximal ICA Vertebral arteries: Codominant and patent within the neck without stenosis. Skeleton: Grade 1 anterolisthesis at C2-C3, C3-C4, C4-C5, C7-T1, T1-T2 and T2-T3. Spondylosis at the cervical and visualized upper thoracic levels. Other  neck: No neck mass or cervical lymphadenopathy. Upper chest: No consolidation within the imaged lung apices. Review of the MIP images confirms the above findings CTA HEAD FINDINGS Anterior circulation: The intracranial internal carotid arteries are patent. Atherosclerotic plaque within both vessels with no more than mild stenosis. The M1 middle cerebral arteries are patent. No M2 proximal branch occlusion or high-grade proximal stenosis. The anterior cerebral arteries are patent. Hypoplastic right A1 segment. No intracranial aneurysm is identified. Posterior circulation: The intracranial vertebral arteries are patent. The basilar artery is patent. The right posterior cerebral artery is occluded proximally (beginning at the P1/P2 junction (series 10, image 20). There is some reconstitution of enhancement within right PCA branches at the distal P2 segment level. Left PCA is patent. 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 A left posterior communicating artery is present. The right posterior communicating artery is diminutive or absent. Venous sinuses: Within the limitations of contrast timing, no convincing thrombus. Anatomic variants: As described. 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 This result was called by telephone at the time of interpretation on 07/20/2023 at 7:07 pm to provider DAVID [facility] , who verbally acknowledged these results. IMPRESSION: Non-contrast head CT: 1. No acute intracranial  hemorrhage or evidence of an acute infarct. 2. 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 3. Background parenchymal atrophy and chronic small vessel ischemic disease. CTA neck: 1. Streak/beam hardening artifact arising from a dense contrast bolus partially obscures the aortic arch and  bilateral subclavian arteries. 2. The common carotid and internal carotid arteries are patent within the neck. Atherosclerotic plaque bilaterally, as described. Most notably, atherosclerotic plaque results in less than 50% stenosis of the proximal left internal carotid artery. 3. The vertebral arteries are patent within the neck without stenosis. 4. Aortic Atherosclerosis (ICD10-I70.0). CTA head: 1. 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 There is some reconstitution of enhancement within right posterior cerebral artery branches beginning at the distal P2 segment level. 2. 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 3. Atherosclerotic plaque within the intracranial internal carotid arteries with no more than mild stenosis. Electronically Signed   By: Bascom Lily D.O.   On: 07/20/2023 19:32    EKG: Independently reviewed.  Normal sinus rhythm.  Assessment/Plan Principal Problem:   Acute CVA (cerebrovascular accident) [facility]) Active Problems:   Benign prostatic hyperplasia with incomplete bladder emptying   Hyperlipidemia    Acute CVA - appreciate neurology consult.  Patient did pass stroke swallow screen.  Will get MRI brain 2D echo lipid panel hemoglobin A1c on neurochecks.  Awaiting further recommendations from neurologist.  Continue antiplatelet agents and statins. Elevated blood pressure reading allow for permissive hypertension.  Follow blood pressure trends. BPH on tamsulosin  and finasteride .  Since patient has acute CVA will need further workup and more than 2 midnight stay.   DVT prophylaxis: Lovenox . Code Status: Full code. Family Communication: Discussed with patient. Disposition Plan: Progressive care. Consults called: Neurologist. Admission status: Inpatient.

## 2023-07-21 NOTE — ED Triage Notes (Signed)
 Pt BIB [facility] from [facility] as a code stroke. LKW 1600, left sided weakness and decreased sensation. Drift to left arm, last nih 5. PCA occlusion, here for ? IR intervention.  Pt A&O.   18g LAC 18g RAC  Plavix given at [facility]

## 2023-07-21 NOTE — Evaluation (Signed)
 Physical Therapy Evaluation Patient Details Name: George Bruce MRN: [id number] DOB: [birth year] Today's Date: 07/21/2023  History of Present Illness  Patient is an 78 y/o male admitted 07/21/23 due to L side weakness, CTA positive for L PCA occlusion on R side with severe L side PCA stenosis.  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  PMH positive for bilat TSA, B cataract removal, arthritis, diverticulitis, BPH, HTN and h/p squamous cell carcinoma.  Clinical Impression  Patient presents with decreased mobility due to L side decreased coordination and strength and impaired sequencing and timing and pain L sciatic area.  Patient previously using walker only lately due to pain from sciatic on L side.  Patient able to ambulate with RW with CGA to S and cues for veering to R as tendency to veer to L.  Also cues for clearing obstacles and evidence for imbalance.  Hopeful for return home with follow up outpatient PT.  Feel needs one more session prior to d/c for wife education on level of assistance and stair training.        If plan is discharge home, recommend the following: A little help with walking and/or transfers;A little help with bathing/dressing/bathroom;Assistance with cooking/housework;Assist for transportation;Help with stairs or ramp for entrance   Can travel by private vehicle        Equipment Recommendations None recommended by PT  Recommendations for Other Services       Functional Status Assessment Patient has had a recent decline in their functional status and demonstrates the ability to make significant improvements in function in a reasonable and predictable amount of time.     Precautions / Restrictions Precautions Precautions: Fall      Mobility  Bed Mobility Overal bed mobility: Needs Assistance Bed Mobility: Supine to Sit, Sit to Supine     Supine to sit: Contact guard, HOB elevated Sit to  supine: Supervision   General bed mobility comments: increased time, some help for trunk coming upright; to supine S for positioning and lines    Transfers Overall transfer level: Needs assistance Equipment used: Rolling walker (2 wheels) Transfers: Sit to/from Stand Sit to Stand: Contact guard assist           General transfer comment: up from tall stretcher in ED, assist for balance initially    Ambulation/Gait Ambulation/Gait assistance: Contact guard assist Gait Distance (Feet): 150 Feet Assistive device: Rolling walker (2 wheels) Gait Pattern/deviations: Drifts right/left, Decreased stride length, Decreased dorsiflexion - left, Step-through pattern       General Gait Details: decreased coordination with mild ataxia on L noting initially with L shoulder protraction and rotated trunk; improved with facilitation, cues for keeping to R as tendency to veer to L throughout needing cues for obstacles on L side.  Stairs            Wheelchair Mobility     Tilt Bed    Modified Rankin (Stroke Patients Only) Modified Rankin (Stroke Patients Only) Pre-Morbid Rankin Score: No significant disability Modified Rankin: Moderately severe disability     Balance Overall balance assessment: Needs assistance Sitting-balance support: Feet supported Sitting balance-Leahy Scale: Good     Standing balance support: Bilateral upper extremity supported Standing balance-Leahy Scale: Poor Standing balance comment: UE support needed for safety               High Level Balance Comments: walked around glove boxes with RW and cues CGA, able to step  over boxes while holding RW with CGA and bent to retrieve one from the floor with close S             Pertinent Vitals/Pain Pain Assessment Pain Assessment: 0-10 Pain Score: 4  Pain Location: L sciatic pain Pain Descriptors / Indicators: Aching Pain Intervention(s): Monitored during session    Home Living Family/patient  expects to be discharged to:: Private residence Living Arrangements: Spouse/significant other Available Help at Discharge: Family Type of Home: House Home Access: Stairs to enter Entrance Stairs-Rails: Right Entrance Stairs-Number of Steps: 3   Home Layout: One level Home Equipment: Agricultural consultant (2 wheels)      Prior Function Prior Level of Function : Independent/Modified Independent;Driving             Mobility Comments: usually no device, using walker recently due to pain at L LE sciatic pain; had therapy for shoulders at [facility], hopes to go there if needs further therapy ADLs Comments: likes to do chores around the yard and play golf     Extremity/Trunk Assessment   Upper Extremity Assessment Upper Extremity Assessment: LUE deficits/detail LUE Deficits / Details: strength grossly 4/5 throughout, decreased coordination with some ataxia noted during testing    Lower Extremity Assessment Lower Extremity Assessment: LLE deficits/detail LLE Deficits / Details: AROM WFL, strength 4- to 4/5 throughout, decreased coordination compared to R slower with toe taps and mild ataxia noted       Communication   Communication Communication: No apparent difficulties    Cognition Arousal: Alert Behavior During Therapy: WFL for tasks assessed/performed   PT - Cognitive impairments: No apparent impairments                         Following commands: Intact       Cueing Cueing Techniques: Verbal cues     General Comments General comments (skin integrity, edema, etc.): wife in the room, supportive and asking about return to activities in the yard to neurologist who was in the room initially; BP post ambuilation 168/87    Exercises Other Exercises Other Exercises: educated and assisted with hamstring stretch gentle x 30 sec and figure 4 piriformis stretch on L due to sciatic pain, heat applied to low back  and RN Aware   Assessment/Plan    PT Assessment  Patient needs continued PT services  PT Problem List Decreased strength;Decreased mobility;Decreased safety awareness;Decreased coordination;Decreased activity tolerance;Decreased knowledge of use of DME;Decreased balance;Pain       PT Treatment Interventions Gait training;DME instruction;Therapeutic exercise;Balance training;Stair training;Functional mobility training;Therapeutic activities;Patient/family education    PT Goals (Current goals can be found in the Care Plan section)  Acute Rehab PT Goals Patient Stated Goal: return to independent PT Goal Formulation: With patient/family Time For Goal Achievement: 08/04/23 Potential to Achieve Goals: Good    Frequency Min 3X/week     Co-evaluation               AM-PAC PT "6 Clicks" Mobility  Outcome Measure Help needed turning from your back to your side while in a flat bed without using bedrails?: None Help needed moving from lying on your back to sitting on the side of a flat bed without using bedrails?: None Help needed moving to and from a bed to a chair (including a wheelchair)?: A Little Help needed standing up from a chair using your arms (e.g., wheelchair or bedside chair)?: A Little Help needed to walk in hospital room?: A Little  Help needed climbing 3-5 steps with a railing? : A Lot 6 Click Score: 19    End of Session Equipment Utilized During Treatment: Gait belt Activity Tolerance: Patient limited by pain Patient left: in bed;with family/visitor present;with call bell/phone within reach Nurse Communication: Patient requests pain meds PT Visit Diagnosis: Other abnormalities of gait and mobility (R26.89);Other symptoms and signs involving the nervous system (R29.898)    Time: 1000-1029 PT Time Calculation (min) (ACUTE ONLY): 29 min   Charges:   PT Evaluation $PT Eval Moderate Complexity: 1 Mod PT Treatments $Gait Training: 8-22 mins PT General Charges $$ ACUTE PT VISIT: 1 Visit         Abigail Hoff,  PT Acute Rehabilitation Services Office:[phone] 07/21/2023   Marley Simmers 07/21/2023, 11:19 AM

## 2023-07-22 ENCOUNTER — Other Ambulatory Visit (HOSPITAL_COMMUNITY)

## 2023-07-22 ENCOUNTER — Inpatient Hospital Stay (HOSPITAL_COMMUNITY)

## 2023-07-22 DIAGNOSIS — E785 Hyperlipidemia, unspecified: Secondary | ICD-10-CM | POA: Diagnosis not present

## 2023-07-22 DIAGNOSIS — I639 Cerebral infarction, unspecified: Secondary | ICD-10-CM | POA: Diagnosis not present

## 2023-07-22 DIAGNOSIS — R030 Elevated blood-pressure reading, without diagnosis of hypertension: Secondary | ICD-10-CM | POA: Diagnosis not present

## 2023-07-22 DIAGNOSIS — N401 Enlarged prostate with lower urinary tract symptoms: Secondary | ICD-10-CM | POA: Diagnosis not present

## 2023-07-22 DIAGNOSIS — I6389 Other cerebral infarction: Secondary | ICD-10-CM | POA: Diagnosis not present

## 2023-07-22 DIAGNOSIS — R3914 Feeling of incomplete bladder emptying: Secondary | ICD-10-CM

## 2023-07-22 LAB — ECHOCARDIOGRAM COMPLETE
AR max vel: 1.86 cm2
AV Area VTI: 2.16 cm2
AV Area mean vel: 2.12 cm2
AV Mean grad: 10 mmHg
AV Peak grad: 22.7 mmHg
Ao pk vel: 2.38 m/s
Area-P 1/2: 2.06 cm2
MV VTI: 2.45 cm2
S' Lateral: 2.6 cm

## 2023-07-22 MED ORDER — ASPIRIN 81 MG PO TBEC: 81.0000 mg | DELAYED_RELEASE_TABLET | Freq: Every day | ORAL | 12 refills | Status: AC

## 2023-07-22 MED ORDER — ATORVASTATIN CALCIUM 40 MG PO TABS: 40.0000 mg | ORAL_TABLET | Freq: Every day | ORAL | 2 refills | Status: AC

## 2023-07-22 MED ORDER — PANTOPRAZOLE SODIUM 40 MG PO TBEC: 40.0000 mg | DELAYED_RELEASE_TABLET | Freq: Every day | ORAL | 1 refills | Status: AC

## 2023-07-22 MED ORDER — AMLODIPINE BESYLATE 5 MG PO TABS: 5.0000 mg | ORAL_TABLET | Freq: Every day | ORAL | 1 refills | Status: AC

## 2023-07-22 MED ORDER — CLOPIDOGREL BISULFATE 75 MG PO TABS: 75.0000 mg | ORAL_TABLET | Freq: Every day | ORAL | 0 refills | Status: AC

## 2023-07-22 NOTE — Progress Notes (Signed)
 Completed AVS; Primary RN completing discharge. Patient family at bed side ready to discharge home.

## 2023-07-22 NOTE — Evaluation (Signed)
 Speech Language Pathology Evaluation Patient Details Name: George Bruce MRN: [id number] DOB: 25-Jul-1945 Today's Date: 07/22/2023 Time: 1640-1650 SLP Time Calculation (min) (ACUTE ONLY): 10 min  Problem List:  Patient Active Problem List   Diagnosis Date Noted   Acute CVA (cerebrovascular accident) (HCC) 07/21/2023   Elevated blood pressure reading 07/21/2023   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   Acute diverticulitis 05/07/2018   History of colonic polyps    Benign neoplasm of ascending colon    Diverticulosis of large intestine without diverticulitis    Erectile dysfunction due to arterial insufficiency 02/18/2018   Degenerative disc disease, lumbar 08/26/2016   Bilateral hip pain 06/24/2016   Chronic midline low back pain 06/24/2016   Polyarthralgia 06/24/2016   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   Hematospermia 05/03/2015   Colon polyps 08/16/2013   Hematuria 08/16/2013   Hyperlipidemia 08/16/2013   Incomplete emptying of bladder 06/23/2013   Benign prostatic hyperplasia with incomplete bladder emptying 11/11/2011   Elevated PSA 11/11/2011   Reduced libido 11/11/2011   Past Medical History:  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   Hypertension    Wears dentures    partial upper and lower   Past Surgical History:  Past Surgical History:  Procedure Laterality Date   CATARACT EXTRACTION W/PHACO Right 09/29/2022   Procedure: CATARACT EXTRACTION PHACO AND INTRAOCULAR LENS PLACEMENT (IOC) RIGHT  10.98  00:56.5;  Surgeon: Rosa College, MD;  Location: [facility];  Service: Ophthalmology;  Laterality: Right;   CATARACT EXTRACTION W/PHACO Left  10/13/2022   Procedure: CATARACT EXTRACTION PHACO AND INTRAOCULAR LENS PLACEMENT (IOC) LEFT 10.79 00:54.1;  Surgeon: Rosa College, MD;  Location: [facility];  Service: Ophthalmology;  Laterality: Left;   COLONOSCOPY WITH PROPOFOL  N/A 04/21/2018   Procedure: COLONOSCOPY WITH PROPOFOL ;  Surgeon: Irby Mannan, MD;  Location: [facility];  Service: Endoscopy;  Laterality: N/A;   INGUINAL HERNIA REPAIR Left 1990's   INGUINAL HERNIA REPAIR Right 2015   REVERSE SHOULDER ARTHROPLASTY Left 11/06/2015   Procedure: REVERSE SHOULDER ARTHROPLASTY;  Surgeon: Elner Hahn, MD;  Location: [facility];  Service: Orthopedics;  Laterality: Left;   REVERSE SHOULDER ARTHROPLASTY Right 03/25/2022   Procedure: REVERSE SHOULDER ARTHROPLASTY;  Surgeon: Elner Hahn, MD;  Location: [facility];  Service: Orthopedics;  Laterality: Right;  MAKE 2ND CASE   HPI:  Patient is an 78 y/o male admitted 07/21/23 due to L side weakness, CTA positive for L PCA occlusion on R side with severe L side PCA stenosis.  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 PMH positive for bilat TSA, B cataract removal, arthritis, diverticulitis, BPH, HTN and h/p squamous cell carcinoma.   Assessment / Plan / Recommendation Clinical Impression  Pt presents with overall functional cognitive-linguistic function but did have increased difficulty with memory retrieval after a delay. Portions of the Cognistat were administered with digit and sentence repetition, command following, naming, problem solving, and reasoning all appear functional. He independently recalled 1/4 novel words after a delay but this increased to 3/4 given semantic cueing and/or multiple choices. Pt and his  wife report no concerns with current presentation. Provided education regarding increased assistance upon returning home with option to f/u with OP SLP. Will sign off acutely.    SLP Assessment  SLP  Recommendation/Assessment: All further Speech Lanaguage Pathology  needs can be addressed in the next venue of care SLP Visit Diagnosis: Cognitive communication deficit (R41.841)    Recommendations for follow up therapy are one component of a multi-disciplinary discharge planning process, led by the attending physician.  Recommendations may be updated based on patient status, additional functional criteria and insurance authorization.    Follow Up Recommendations  Outpatient SLP    Assistance Recommended at Discharge  Set up Supervision/Assistance  Functional Status Assessment Patient has had a recent decline in their functional status and demonstrates the ability to make significant improvements in function in a reasonable and predictable amount of time.  Frequency and Duration           SLP Evaluation Cognition  Overall Cognitive Status: Impaired/Different from baseline Arousal/Alertness: Awake/alert Orientation Level: Oriented X4 Attention: Sustained Sustained Attention: Appears intact Memory: Impaired Memory Impairment: Retrieval deficit Awareness: Appears intact Problem Solving: Appears intact       Comprehension  Auditory Comprehension Overall Auditory Comprehension: Appears within functional limits for tasks assessed    Expression Expression Primary Mode of Expression: Verbal Verbal Expression Overall Verbal Expression: Appears within functional limits for tasks assessed   Oral / Motor  Oral Motor/Sensory Function Overall Oral Motor/Sensory Function: Within functional limits Motor Speech Overall Motor Speech: Appears within functional limits for tasks assessed            Amil Kale, M.A., CCC-SLP Speech Language Pathology, Acute Rehabilitation Services  Secure Chat preferred [phone]  07/22/2023, 5:05 PM

## 2023-07-22 NOTE — Progress Notes (Signed)
 Physical Therapy Treatment Patient Details Name: George Bruce MRN: [id number] DOB: [birth year] Today's Date: 07/22/2023   History of Present Illness Patient is an 78 y/o male admitted 07/21/23 due to L side weakness, CTA positive for L PCA occlusion on R side with severe L side PCA stenosis.  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  PMH positive for bilat TSA, B cataract removal, arthritis, diverticulitis, BPH, HTN and h/p squamous cell carcinoma.    PT Comments  Tolerated well, safely navigating in hallways and up/down stairs with supervision. Pt feels confident with task. Verbalized understanding of need for RW at all times due to residual instability. Agreeable to OPPT follow-up for balance training to regain functional independence and maximize outcomes post-stroke. Patient will continue to benefit from skilled physical therapy services to further improve independence with functional mobility.     If plan is discharge home, recommend the following: A little help with walking and/or transfers;A little help with bathing/dressing/bathroom;Assistance with cooking/housework;Assist for transportation;Help with stairs or ramp for entrance   Can travel by private vehicle        Equipment Recommendations  None recommended by PT    Recommendations for Other Services       Precautions / Restrictions Precautions Precautions: Fall Restrictions Weight Bearing Restrictions Per Provider Order: No     Mobility  Bed Mobility Overal bed mobility: Modified Independent Bed Mobility: Supine to Sit, Sit to Supine     Supine to sit: HOB elevated, Modified independent (Device/Increase time) Sit to supine: Modified independent (Device/Increase time)   General bed mobility comments: No assist required, minor effort to rise.    Transfers Overall transfer level: Needs assistance Equipment used: Rolling walker (2  wheels) Transfers: Sit to/from Stand Sit to Stand: Supervision           General transfer comment: Supervision for safety, good power up, stablizes with RW for support. Minimal sway with this device.    Ambulation/Gait Ambulation/Gait assistance: Supervision Gait Distance (Feet): 225 Feet Assistive device: Rolling walker (2 wheels), None Gait Pattern/deviations: Drifts right/left, Decreased stride length, Decreased dorsiflexion - left, Step-through pattern Gait velocity: dec Gait velocity interpretation: <1.8 ft/sec, indicate of risk for recurrent falls   General Gait Details: Reviewed safe AD use with RW for support. Supervision for safety, minor deficits in gait as above. Short distance into restroom, furniture walking with more pronounced instability noted. Able to self correct but reviewed importance of RW use full time for safety.   Stairs Stairs: Yes Stairs assistance: Supervision Stair Management: One rail Right, Alternating pattern, Step to pattern, Forwards Number of Stairs: 13 General stair comments: Educated on sequencing tehcniques and safety. Able to navigate up with single rail on Rt similar to home set-up, alternating step pattern, descending with step-to pattern, bil hands to single rail on Lt. Feels confident with taks. Supervision for safety. Wife to guard at d/c.   Wheelchair Mobility     Tilt Bed    Modified Rankin (Stroke Patients Only) Modified Rankin (Stroke Patients Only) Pre-Morbid Rankin Score: No significant disability Modified Rankin: Moderately severe disability     Balance Overall balance assessment: Needs assistance Sitting-balance support: Feet supported Sitting balance-Leahy Scale: Good     Standing balance support: No upper extremity supported Standing balance-Leahy Scale: Fair Standing balance comment: increased sway, able to urinate while standing.  Communication  Communication Communication: No apparent difficulties  Cognition Arousal: Alert Behavior During Therapy: WFL for tasks assessed/performed   PT - Cognitive impairments: No apparent impairments                         Following commands: Intact      Cueing Cueing Techniques: Verbal cues  Exercises      General Comments General comments (skin integrity, edema, etc.): VSS      Pertinent Vitals/Pain Pain Assessment Pain Assessment: No/denies pain    Home Living                          Prior Function            PT Goals (current goals can now be found in the care plan section) Acute Rehab PT Goals Patient Stated Goal: return to independent PT Goal Formulation: With patient/family Time For Goal Achievement: 08/04/23 Potential to Achieve Goals: Good Progress towards PT goals: Progressing toward goals    Frequency    Min 3X/week      PT Plan      Co-evaluation              AM-PAC PT "6 Clicks" Mobility   Outcome Measure  Help needed turning from your back to your side while in a flat bed without using bedrails?: None Help needed moving from lying on your back to sitting on the side of a flat bed without using bedrails?: None Help needed moving to and from a bed to a chair (including a wheelchair)?: A Little Help needed standing up from a chair using your arms (e.g., wheelchair or bedside chair)?: A Little Help needed to walk in hospital room?: A Little Help needed climbing 3-5 steps with a railing? : A Little 6 Click Score: 20    End of Session Equipment Utilized During Treatment: Gait belt Activity Tolerance: Patient tolerated treatment well Patient left: in bed;with family/visitor present;with call bell/phone within reach;with bed alarm set Nurse Communication: Need for lift equipment PT Visit Diagnosis: Other abnormalities of gait and mobility (R26.89);Other symptoms and signs involving the nervous system (R29.898);Hemiplegia and  hemiparesis Hemiplegia - Right/Left: Left Hemiplegia - dominant/non-dominant: Non-dominant     Time: 1610-9604 PT Time Calculation (min) (ACUTE ONLY): 26 min  Charges:    $Gait Training: 8-22 mins $Therapeutic Activity: 8-22 mins PT General Charges $$ ACUTE PT VISIT: 1 Visit                     Jory Ng, PT, DPT [facility]  Rehabilitation Services Physical Therapist Office: [phone] Website: [email or web address]    Alinda Irani 07/22/2023, 9:43 AM

## 2023-07-22 NOTE — Evaluation (Signed)
 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  PMH positive for bilat TSA, B cataract removal, arthritis, diverticulitis, BPH, HTN and h/p squamous cell carcinoma.     Clinical Impressions Pt presents with decline in function and safety with ADLs and ADL mobility with impaired balance, L UE coordination, strength and endurance. PTA pt lives with wife and was Ind with ADLs/selfcare, IADLs, drives used no AD for mobility except with sciatic nerve pain flares up per pt report. Pt currently requires Sup for safety with ADLs and ADL mobility using RW and with HHA. Pt able to step in/out of shower using grab bar for safety, stand at sink for oral care and simulate LB bathing in shower while standing using grab bar for stability. Pt would benefit from acute OT services to address impairments to maximize level of function and safety     If plan is discharge home, recommend the following:   A little help with bathing/dressing/bathroom;A little help with walking and/or transfers;Assistance with cooking/housework;Assist for transportation     Functional Status Assessment   Patient has had a recent decline in their functional status and demonstrates the ability to make significant improvements in function in a reasonable and predictable amount of time.     Equipment Recommendations   None recommended by OT     Recommendations for Other Services         Precautions/Restrictions   Precautions Precautions: Fall Restrictions Weight Bearing Restrictions Per Provider Order: No     Mobility Bed Mobility Overal bed mobility:  Modified Independent Bed Mobility: Supine to Sit, Sit to Supine     Supine to sit: HOB elevated, Modified independent (Device/Increase time) Sit to supine: Modified independent (Device/Increase time)        Transfers Overall transfer level: Needs assistance Equipment used: Rolling walker (2 wheels) Transfers: Sit to/from Stand Sit to Stand: Supervision                  Balance Overall balance assessment: Needs assistance Sitting-balance support: Feet supported Sitting balance-Leahy Scale: Good     Standing balance support: No upper extremity supported, During functional activity Standing balance-Leahy Scale: Fair Standing balance comment: able to step in/out of shower using grab bar for safety, stand at sink for oral care and simulate LB bathing in shower while standing using grab bar for stability                           ADL either performed or assessed with clinical judgement   ADL Overall ADL's : Needs assistance/impaired Eating/Feeding: Independent   Grooming: Wash/dry hands;Wash/dry face;Oral care;Supervision/safety;Standing   Upper Body Bathing: Modified independent   Lower Body Bathing: Supervison/ safety;Cueing for safety;Sit to/from stand   Upper Body Dressing : Modified independent   Lower Body Dressing: Supervision/safety;Sit to/from stand;Cueing for safety   Toilet Transfer: Supervision/safety;Ambulation;Cueing for safety;Grab bars   Toileting- Clothing Manipulation and Hygiene: Supervision/safety;Sit to/from stand   Tub/ Shower Transfer: Supervision/safety;Ambulation;Grab bars;Cueing for safety   Functional mobility during ADLs: Supervision/safety;Cueing for safety General ADL Comments: able to step in/out of shower using grab bar for  safety, stand at sink for oral care and simulate LB bathing in shower while standing using grab bar for stability. Sup for safety, used grab bars for safety at toilet and in shower.     Vision Ability  to See in Adequate Light: 0 Adequate Patient Visual Report: No change from baseline       Perception         Praxis         Pertinent Vitals/Pain Pain Assessment Pain Assessment: No/denies pain Pain Score: 0-No pain Pain Intervention(s): Monitored during session, Premedicated before session, Repositioned     Extremity/Trunk Assessment Upper Extremity Assessment LUE Deficits / Details: 4/5 grossly, decreased coordination with some ataxia noted   Lower Extremity Assessment Lower Extremity Assessment: Defer to PT evaluation   Cervical / Trunk Assessment Cervical / Trunk Assessment: Normal   Communication Communication Communication: No apparent difficulties   Cognition Arousal: Alert Behavior During Therapy: WFL for tasks assessed/performed                                 Following commands: Intact       Cueing  General Comments   Cueing Techniques: Verbal cues  VSS   Exercises     Shoulder Instructions      Home Living Family/patient expects to be discharged to:: Private residence Living Arrangements: Spouse/significant other Available Help at Discharge: Family;Available 24 hours/day Type of Home: House Home Access: Stairs to enter [employer] of Steps: 3 Entrance Stairs-Rails: Right Home Layout: One level     Bathroom Shower/Tub: Chief Strategy Officer: Handicapped height     Home Equipment: Agricultural consultant (2 wheels);Shower seat - built in          Prior Functioning/Environment Prior Level of Function : Independent/Modified Independent;Driving             Mobility Comments: usually no device, using walker recently due to pain at L LE sciatic pain; had therapy for shoulders at [facility], hopes to go there if needs further therapy ADLs Comments: Ind with ADLs, IADLs, likes to do chores around the yard and play golf    OT Problem List: Decreased strength;Impaired balance (sitting and/or  standing);Decreased coordination;Decreased activity tolerance;Decreased knowledge of use of DME or AE   OT Treatment/Interventions: Self-care/ADL training;Patient/family education;Balance training;Therapeutic activities;DME and/or AE instruction      OT Goals(Current goals can be found in the care plan section)   Acute Rehab OT Goals Patient Stated Goal: go home OT Goal Formulation: With patient/family Time For Goal Achievement: 08/05/23 Potential to Achieve Goals: Good ADL Goals Pt Will Transfer to Toilet: with modified independence;Independently;ambulating Pt Will Perform Tub/Shower Transfer: with modified independence;ambulating;shower seat;grab bars Additional ADL Goal #1: Pt will complete all ADLs/selfcare tasks Mod I - Ind Additional ADL Goal #2: Pt will engage in L UE coordination exercises to maximize functional use   OT Frequency:  Min 2X/week    Co-evaluation              AM-PAC OT "6 Clicks" Daily Activity     Outcome Measure Help from another person eating meals?: None Help from another person taking care of personal grooming?: A Little Help from another person toileting, which includes using toliet, bedpan, or urinal?: A Little Help from another person bathing (including washing, rinsing, drying)?: A Little Help from another person to put on and taking off regular upper body clothing?: A Little Help  from another person to put on and taking off regular lower body clothing?: A Little 6 Click Score: 19   End of Session Equipment Utilized During Treatment: Gait belt;Rolling walker (2 wheels) Nurse Communication: Mobility status  Activity Tolerance: Patient tolerated treatment well Patient left: in bed;with call bell/phone within reach;with bed alarm set;with family/visitor present  OT Visit Diagnosis: Unsteadiness on feet (R26.81);Muscle weakness (generalized) (M62.81)                Time: 8469-6295 OT Time Calculation (min): 30 min Charges:  OT General  Charges $OT Visit: 1 Visit OT Evaluation $OT Eval Moderate Complexity: 1 Mod OT Treatments $Self Care/Home Management : 8-22 mins    Alfred Ann 07/22/2023, 12:32 PM

## 2023-07-22 NOTE — Discharge Summary (Signed)
 PATIENT DETAILS Name: George Bruce Age: 78 y.o. Sex: male Date of Birth: Feb 07, 1946 MRN: [id number]. Admitting Physician: Vada Garibaldi, MD [id number], Rosalynn Come, MD  Admit Date: 07/21/2023 Discharge date: 07/22/2023  Recommendations for Outpatient Follow-up:  Follow up with PCP in 1-2 weeks Please obtain CMP/CBC in one week Please ensure follow-up with stroke clinic/neurology.  Admitted From:  Home  Disposition: Home   Discharge Condition: good  CODE STATUS:   Code Status: Full Code   Diet recommendation:  Diet Order             Diet - low sodium heart healthy           Diet Heart Room service appropriate? Yes; Fluid consistency: Thin  Diet effective now                    Brief Summary: 78 year old with HLD, BPH who presented with left-sided weakness-he was found to have acute CVA.  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  Severe stenosis within a left posterior cerebral artery branch at the P2-P3 junction. 5/13>> MRI brain: 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 5/13>> A1c: 5.6 5/13>> LDL: 53 5/14>> echo: EF 65-70%  Brief Hospital Course: Acute ischemic CVA Left-sided weakness has essentially resolved Etiology felt to be due to large vessel disease-right PCA occlusion Workup as above Aspirin /Plavix x 3 months followed by aspirin  alone  HTN Initially permissive hyperglycemia allowed Amlodipine subsequently started-which will be continued on discharge-PCP to gradually optimize over the next several weeks.  HLD Continue statin on discharge  BPH Stable Continue Flomax /finasteride    Discharge Diagnoses:  Principal Problem:   Acute CVA (cerebrovascular accident) [facility]) Active Problems:   Benign prostatic hyperplasia with incomplete bladder emptying   Hyperlipidemia   Elevated blood pressure reading   Stroke (cerebrum) [facility])   Discharge  Instructions:  Activity:  As tolerated  Discharge Instructions     Ambulatory referral to Neurology   Complete by: As directed    Follow up for stroke in 4-6 weeks. Thanks   Call MD for:  extreme fatigue   Complete by: As directed    Call MD for:  persistant dizziness or light-headedness   Complete by: As directed    Diet - low sodium heart healthy   Complete by: As directed    Discharge instructions   Complete by: As directed    Follow with Primary MD  Yehuda Helms, MD in 1-2 weeks  Follow-up with stroke clinic at [facility] neurology-the office will call you with a follow-up appointment-if you do not hear from them-please give them a call  Take aspirin /Plavix x 3 months-after 3 months-stop Plavix and continue on aspirin   We have switched your cholesterol medication from simvastatin  to atorvastatin.  Please get a complete blood count and chemistry panel checked by your Primary MD at your next visit, and again as instructed by your Primary MD.  Get Medicines reviewed and adjusted: Please take all your medications with you for your next visit with your Primary MD  Laboratory/radiological data: Please request your Primary MD to go over all hospital tests and procedure/radiological results at the follow up, please ask your Primary MD to get all Hospital records sent to his/her office.  In some cases, they will be blood work, cultures and biopsy results pending at the time of your discharge. Please request that your primary care M.D. follows up on these results.  Also  Note the following: If you experience worsening of your admission symptoms, develop shortness of breath, life threatening emergency, suicidal or homicidal thoughts you must seek medical attention immediately by calling 911 or calling your MD immediately  if symptoms less severe.  You must read complete instructions/literature along with all the possible adverse reactions/side effects for all the Medicines you take  and that have been prescribed to you. Take any new Medicines after you have completely understood and accpet all the possible adverse reactions/side effects.   Do not drive when taking Pain medications or sleeping medications (Benzodaizepines)  Do not take more than prescribed Pain, Sleep and Anxiety Medications. It is not advisable to combine anxiety,sleep and pain medications without talking with your primary care practitioner  Special Instructions: If you have smoked or chewed Tobacco  in the last 2 yrs please stop smoking, stop any regular Alcohol  and or any Recreational drug use.  Wear Seat belts while driving.  Please note: You were cared for by a hospitalist during your hospital stay. Once you are discharged, your primary care physician will handle any further medical issues. Please note that NO REFILLS for any discharge medications will be authorized once you are discharged, as it is imperative that you return to your primary care physician (or establish a relationship with a primary care physician if you do not have one) for your post hospital discharge needs so that they can reassess your need for medications and monitor your lab values.   Increase activity slowly   Complete by: As directed       Allergies as of 07/22/2023       Reactions   Iodine Rash   topical        Medication List     STOP taking these medications    celecoxib 200 MG capsule Commonly known as: CELEBREX   simvastatin  40 MG tablet Commonly known as: ZOCOR        TAKE these medications    amLODipine 5 MG tablet Commonly known as: NORVASC Take 1 tablet (5 mg total) by mouth daily.   aspirin  EC 81 MG tablet Take 1 tablet (81 mg total) by mouth daily. Swallow whole.   atorvastatin 40 MG tablet Commonly known as: LIPITOR Take 1 tablet (40 mg total) by mouth daily at 6 PM.   clopidogrel 75 MG tablet Commonly known as: PLAVIX Take 1 tablet (75 mg total) by mouth daily. Take along with  aspirin  x 3 months-after 3 months stop Plavix and continue on aspirin .   finasteride  5 MG tablet Commonly known as: PROSCAR  TAKE ONE TABLET (5 MG) BY MOUTH EVERY DAY   HYDROcodone -acetaminophen  5-325 MG tablet Commonly known as: NORCO/VICODIN Take 1 tablet by mouth every 6 (six) hours as needed for moderate pain (pain score 4-6).   multivitamin with minerals Tabs tablet Take 1 tablet by mouth daily.   pantoprazole  40 MG tablet Commonly known as: Protonix  Take 1 tablet (40 mg total) by mouth daily.   predniSONE 10 MG tablet Commonly known as: DELTASONE TAKE 6 TABLETS ON DAY 1 AS DIRECTED ON PACKAGE AND DECREASE BY 1 TAB EACH DAY FOR A TOTAL OF 6 DAYS   tamsulosin  0.4 MG Caps capsule Commonly known as: FLOMAX  TAKE ONE CAPSULE DAILY 30 MINS AFTER THE SAME MEAL EACH DAY.        Follow-up Information     Yehuda Helms, MD. Schedule an appointment as soon as possible for a visit in 1 week(s).   Specialty: Internal  Medicine Contact information: [address] [facility] [place] [address] [phone]         Rosan Comfort, MD. Schedule an appointment as soon as possible for a visit in 1 month(s).   Specialty: Neurology Contact information: [phone] [facility] [address] [facility] [place] [address] [phone]                Allergies  Allergen Reactions   Iodine Rash    topical     Other Procedures/Studies: ECHOCARDIOGRAM COMPLETE Result Date: 07/22/2023    ECHOCARDIOGRAM REPORT   Patient Name:   George Bruce Date of Exam: 07/22/2023 Medical Rec #:  [id number]       Height:       69.0 in Accession #:    [id number]      Weight:       178.0 lb Date of Birth:  04/12/1945        BSA:          1.966 m Patient Age:    78 years        BP:           143/83 mmHg Patient Gender: M               HR:           69 bpm. Exam Location:  Inpatient Procedure: 2D Echo, Cardiac Doppler and Color Doppler (Both Spectral and Color             Flow Doppler were utilized during procedure). Indications:    Stroke  History:        Patient has no prior history of Echocardiogram examinations.                 Risk Factors:Former Smoker and Hypertension.  Sonographer:    Willey Harrier Referring Phys: Mayo Speck Tylene Galla M Carey Johndrow IMPRESSIONS  1. Left ventricular ejection fraction, by estimation, is 65 to 70%. The left ventricle has normal function. The left ventricle has no regional wall motion abnormalities. There is mild left ventricular hypertrophy. Left ventricular diastolic parameters are consistent with Grade I diastolic dysfunction (impaired relaxation).  2. Right ventricular systolic function is normal. The right ventricular size is mildly enlarged. There is normal pulmonary artery systolic pressure. The estimated right ventricular systolic pressure is 23.4 mmHg.  3. Left atrial size was moderately dilated.  4. Right atrial size was moderately dilated.  5. The mitral valve is degenerative. Trivial mitral valve regurgitation. No evidence of mitral stenosis.  6. The aortic valve is tricuspid. There is mild calcification of the aortic valve. Aortic valve regurgitation is not visualized. Mild aortic valve stenosis. Aortic valve area, by VTI measures 2.16 cm. Aortic valve mean gradient measures 10.0 mmHg. Aortic valve Vmax measures 2.38 m/s.  7. The inferior vena cava is normal in size with greater than 50% respiratory variability, suggesting right atrial pressure of 3 mmHg. Conclusion(s)/Recommendation(s): No intracardiac source of embolism detected on this transthoracic study. Consider a transesophageal echocardiogram to exclude cardiac source of embolism if clinically indicated. FINDINGS  Left Ventricle: Left ventricular ejection fraction, by estimation, is 65 to 70%. The left ventricle has normal function. The left ventricle has no regional wall motion abnormalities. The left ventricular internal cavity size was normal in size. There is  mild left ventricular  hypertrophy. Left ventricular diastolic parameters are consistent with Grade I diastolic dysfunction (impaired relaxation). Right Ventricle: The right ventricular size is mildly enlarged. No increase in right ventricular  wall thickness. Right ventricular systolic function is normal. There is normal pulmonary artery systolic pressure. The tricuspid regurgitant velocity is 2.26  m/s, and with an assumed right atrial pressure of 3 mmHg, the estimated right ventricular systolic pressure is 23.4 mmHg. Left Atrium: Left atrial size was moderately dilated. Right Atrium: Right atrial size was moderately dilated. Pericardium: There is no evidence of pericardial effusion. Mitral Valve: The mitral valve is degenerative in appearance. Mild to moderate mitral annular calcification. Trivial mitral valve regurgitation. No evidence of mitral valve stenosis. MV peak gradient, 6.2 mmHg. The mean mitral valve gradient is 2.0 mmHg. Tricuspid Valve: The tricuspid valve is normal in structure. Tricuspid valve regurgitation is trivial. No evidence of tricuspid stenosis. Aortic Valve: The aortic valve is tricuspid. There is mild calcification of the aortic valve. Aortic valve regurgitation is not visualized. Mild aortic stenosis is present. Aortic valve mean gradient measures 10.0 mmHg. Aortic valve peak gradient measures 22.7 mmHg. Aortic valve area, by VTI measures 2.16 cm. Pulmonic Valve: The pulmonic valve was normal in structure. Pulmonic valve regurgitation is trivial. No evidence of pulmonic stenosis. Aorta: The aortic root is normal in size and structure. Ascending aorta measurements are within normal limits for age when indexed to body surface area. Venous: The inferior vena cava is normal in size with greater than 50% respiratory variability, suggesting right atrial pressure of 3 mmHg. IAS/Shunts: No atrial level shunt detected by color flow Doppler.  LEFT VENTRICLE PLAX 2D LVIDd:         5.00 cm   Diastology LVIDs:         2.60  cm   LV e' medial:    7.83 cm/s LV PW:         1.00 cm   LV E/e' medial:  10.8 LV IVS:        1.00 cm   LV e' lateral:   8.49 cm/s LVOT diam:     2.00 cm   LV E/e' lateral: 10.0 LV SV:         88 LV SV Index:   45 LVOT Area:     3.14 cm  RIGHT VENTRICLE             IVC RV Basal diam:  4.70 cm     IVC diam: 1.70 cm RV S prime:     22.00 cm/s TAPSE (M-mode): 2.6 cm LEFT ATRIUM             Index        RIGHT ATRIUM           Index LA Vol (A2C):   65.7 ml 33.42 ml/m  RA Area:     20.40 cm LA Vol (A4C):   54.7 ml 27.82 ml/m  RA Volume:   59.60 ml  30.31 ml/m LA Biplane Vol: 64.5 ml 32.80 ml/m  AORTIC VALVE AV Area (Vmax):    1.86 cm AV Area (Vmean):   2.12 cm AV Area (VTI):     2.16 cm AV Vmax:           238.00 cm/s AV Vmean:          139.000 cm/s AV VTI:            0.408 m AV Peak Grad:      22.7 mmHg AV Mean Grad:      10.0 mmHg LVOT Vmax:         141.00 cm/s LVOT Vmean:        93.900 cm/s  LVOT VTI:          0.281 m LVOT/AV VTI ratio: 0.69  AORTA Ao Root diam: 3.20 cm Ao Asc diam:  3.80 cm MITRAL VALVE                TRICUSPID VALVE MV Area (PHT): 2.06 cm     TR Peak grad:   20.4 mmHg MV Area VTI:   2.45 cm     TR Vmax:        226.00 cm/s MV Peak grad:  6.2 mmHg MV Mean grad:  2.0 mmHg     SHUNTS MV Vmax:       1.24 m/s     Systemic VTI:  0.28 m MV Vmean:      74.6 cm/s    Systemic Diam: 2.00 cm MV Decel Time: 368 msec MV E velocity: 84.90 cm/s MV A velocity: 114.00 cm/s MV E/A ratio:  0.74 Grady Lawman MD Electronically signed by Grady Lawman MD Signature Date/Time: 07/22/2023/3:43:54 PM    Final    MR BRAIN WO CONTRAST Result Date: 07/21/2023 CLINICAL DATA:  78 year old male with neurologic deficit. Code stroke presentation yesterday. EXAM: MRI HEAD WITHOUT CONTRAST TECHNIQUE: Multiplanar, multiecho pulse sequences of the brain and surrounding structures were obtained without intravenous contrast. COMPARISON:  CT head, CTA, CTP yesterday.  Brain MRI 02/22/2008. FINDINGS: Brain: Asymmetric, abnormal  diffusion in the right hippocampal formation including the body and tail. But also patchy restricted diffusion in the lateral right thalamus near the posterior limb external capsule (series 9, image 76, series 10, image 26). Patchy T2 and FLAIR hyperintensity in both areas. No acute hemorrhage or mass effect. No other abnormal diffusion in the right PCA territory. Questionable faint abnormal diffusion also in the right posterior corona radiata on series 9, image 81 although might be T2 shine through. Nearby chronic lacunar infarct of the right caudate. Cerebral volume loss since 2009 appears fairly generalized. Progressed T2 heterogeneity in the bilateral deep gray nuclei since that time. And scattered increased bilateral cerebral white matter T2 and FLAIR hyperintensity. But no cortical encephalomalacia and no chronic cerebral blood products identified. Brainstem and cerebellum remain normal for age. No midline shift, mass effect, evidence of mass lesion, ventriculomegaly, extra-axial collection or acute intracranial hemorrhage. Cervicomedullary junction and pituitary are within normal limits. Vascular: Major intracranial vascular flow voids are stable since 2009. Skull and upper cervical spine: Visualized bone marrow signal is within normal limits. Partially visible cervical disc protrusion at C4-C5 on series 13, image 13. Negative for age upper cervical spine otherwise. Sinuses/Orbits: Postoperative changes to both globes since 2009, otherwise stable and negative. Other: Mastoids are clear. Grossly normal visible internal auditory structures. Negative visible scalp and face. IMPRESSION: 1. Positive for Acute lacunar infarct of the lateral Right Thalamus, but also abnormal diffusion throughout the right hippocampal formation. Both are Right PCA territory, although superimposed seizure related changes of the right hippocampus would be difficult to exclude. No associated hemorrhage or mass effect. 2. 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 No other acute intracranial abnormality identified. Electronically Signed   By: Marlise Simpers M.D.   On: 07/21/2023 05:51   CT CEREBRAL PERFUSION W CONTRAST Result Date: 07/20/2023 CLINICAL DATA:  Stroke EXAM: CT PERFUSION BRAIN TECHNIQUE: Multiphase CT imaging of the brain was performed following IV bolus contrast injection. Subsequent parametric perfusion maps were calculated using RAPID software. RADIATION DOSE  REDUCTION: This exam was performed according to the departmental dose-optimization program which includes automated exposure control, adjustment of the mA and/or kV according to patient size and/or use of iterative reconstruction technique. CONTRAST:  50mL OMNIPAQUE  IOHEXOL  350 MG/ML SOLN COMPARISON:  None Available. FINDINGS: CT Brain Perfusion Findings: CBF (<30%) Volume: 0mL Perfusion (Tmax>6.0s) volume: 0mL Mismatch Volume: 0mL Infarct Core: 0 mL Infarction Location:None IMPRESSION: No emergent large vessel occlusion or high-grade stenosis of the intracranial arteries. Electronically Signed   By: Juanetta Nordmann M.D.   On: 07/20/2023 19:44   CT HEAD CODE STROKE WO CONTRAST Result Date: 07/20/2023 CLINICAL DATA:  Code stroke. Neuro deficit, acute, stroke suspected. EXAM: CT ANGIOGRAPHY HEAD AND NECK TECHNIQUE: Multidetector CT imaging of the head and neck was performed using the standard protocol during bolus administration of intravenous contrast. Multiplanar CT image reconstructions and MIPs were obtained to evaluate the vascular anatomy. Carotid stenosis measurements (when applicable) are obtained utilizing NASCET criteria, using the distal internal carotid diameter as the denominator. RADIATION DOSE REDUCTION: This exam was performed according to the departmental dose-optimization program which includes automated exposure control, adjustment of the mA and/or kV  according to patient size and/or use of iterative reconstruction technique. CONTRAST:  75mL OMNIPAQUE  IOHEXOL  350 MG/ML SOLN COMPARISON:  Brain MRI 02/22/2008. FINDINGS: CT HEAD FINDINGS Brain: Generalized cerebral atrophy. 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 Mild patchy and ill-defined hypoattenuation within the cerebral white matter, nonspecific but compatible with chronic small vessel ischemic disease. There is no acute intracranial hemorrhage. No demarcated cortical infarct. No extra-axial fluid collection. No evidence of an intracranial mass. No midline shift. Vascular: No hyperdense vessel. Atherosclerotic calcifications. Skull: No calvarial fracture or aggressive osseous lesion. Sinuses/Orbits: No orbital mass or acute orbital finding. No significant paranasal sinus disease. ASPECTS ([place] Stroke Program Early CT Score) - Ganglionic level infarction (caudate, lentiform nuclei, internal capsule, insula, M1-M3 cortex): 7 - Supraganglionic infarction (M4-M6 cortex): 3 Total score (0-10 with 10 being normal): 10 Review of the MIP images confirms the above findings No noncontrast CT evidence of an acute intracranial mallet. These results were called by telephone at the time of interpretation on 07/20/2023 at 7:06 pm to provider DAVID [facility] , who verbally acknowledged these results. CTA NECK FINDINGS Aortic arch: Streak/beam hardening artifact arising from a dense contrast bolus partially obscures the aortic arch and bilateral subclavian arteries. Within this limitation, findings are as follows. Atherosclerotic plaque within the visualized aortic arch and proximal major branch vessels of the neck. No appreciable hemodynamically significant innominate or proximal subclavian artery stenosis. Right carotid system: CCA and ICA patent within the neck without hemodynamically significant stenosis (50% or greater). Mild atherosclerotic plaque about the carotid bifurcation and  within the proximal ICA. Left carotid system: CCA and ICA patent within the neck. A sclerotic plaque about the carotid bifurcation and within the proximal ICA resulting in less than 50% stenosis of the proximal ICA Vertebral arteries: Codominant and patent within the neck without stenosis. Skeleton: Grade 1 anterolisthesis at C2-C3, C3-C4, C4-C5, C7-T1, T1-T2 and T2-T3. Spondylosis at the cervical and visualized upper thoracic levels. Other neck: No neck mass or cervical lymphadenopathy. Upper chest: No consolidation within the imaged lung apices. Review of the MIP images confirms the above findings CTA HEAD FINDINGS Anterior circulation: The intracranial internal carotid arteries are patent. Atherosclerotic plaque within both vessels with no more than mild stenosis. The M1 middle cerebral arteries are patent. No M2 proximal branch occlusion or high-grade proximal stenosis. The  anterior cerebral arteries are patent. Hypoplastic right A1 segment. No intracranial aneurysm is identified. Posterior circulation: The intracranial vertebral arteries are patent. The basilar artery is patent. The right posterior cerebral artery is occluded proximally (beginning at the P1/P2 junction (series 10, image 20). There is some reconstitution of enhancement within right PCA branches at the distal P2 segment level. Left PCA is patent. 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 A left posterior communicating artery is present. The right posterior communicating artery is diminutive or absent. Venous sinuses: Within the limitations of contrast timing, no convincing thrombus. Anatomic variants: As described. 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 This result was called by telephone at the time of interpretation on 07/20/2023 at 7:07 pm  to provider DAVID [facility] , who verbally acknowledged these results. IMPRESSION: Non-contrast head CT: 1. No acute intracranial hemorrhage or evidence of an acute infarct. 2. 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 3. Background parenchymal atrophy and chronic small vessel ischemic disease. CTA neck: 1. Streak/beam hardening artifact arising from a dense contrast bolus partially obscures the aortic arch and bilateral subclavian arteries. 2. The common carotid and internal carotid arteries are patent within the neck. Atherosclerotic plaque bilaterally, as described. Most notably, atherosclerotic plaque results in less than 50% stenosis of the proximal left internal carotid artery. 3. The vertebral arteries are patent within the neck without stenosis. 4. Aortic Atherosclerosis (ICD10-I70.0). CTA head: 1. 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 There is some reconstitution of enhancement within right posterior cerebral artery branches beginning at the distal P2 segment level. 2. 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 3. Atherosclerotic plaque within the intracranial internal carotid arteries with no more than mild stenosis. Electronically Signed   By: Bascom Lily D.O.   On: 07/20/2023 19:32   CT ANGIO HEAD NECK W WO CM (CODE STROKE) Result Date: 07/20/2023 CLINICAL DATA:  Code stroke. Neuro deficit, acute, stroke suspected. EXAM: CT ANGIOGRAPHY HEAD AND NECK TECHNIQUE: Multidetector CT imaging of the head and neck was performed using the standard protocol during bolus administration of intravenous contrast. Multiplanar CT image reconstructions and MIPs were obtained to evaluate the vascular anatomy. Carotid stenosis measurements (when applicable) are obtained utilizing NASCET criteria, using the distal  internal carotid diameter as the denominator. RADIATION DOSE REDUCTION: This exam was performed according to the departmental dose-optimization program which includes automated exposure control, adjustment of the mA and/or kV according to patient size and/or use of iterative reconstruction technique. CONTRAST:  75mL OMNIPAQUE  IOHEXOL  350 MG/ML SOLN COMPARISON:  Brain MRI 02/22/2008. FINDINGS: CT HEAD FINDINGS Brain: Generalized cerebral atrophy. 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 Mild patchy and ill-defined hypoattenuation within the cerebral white matter, nonspecific but compatible with chronic small vessel ischemic disease. There is no acute intracranial hemorrhage. No demarcated cortical infarct. No extra-axial fluid collection. No evidence of an intracranial mass. No midline shift. Vascular: No hyperdense vessel. Atherosclerotic calcifications. Skull: No calvarial fracture or aggressive osseous lesion. Sinuses/Orbits: No orbital mass or acute orbital finding. No significant paranasal sinus disease. ASPECTS [facility] Stroke Program Early CT Score) - Ganglionic level infarction (  caudate, lentiform nuclei, internal capsule, insula, M1-M3 cortex): 7 - Supraganglionic infarction (M4-M6 cortex): 3 Total score (0-10 with 10 being normal): 10 Review of the MIP images confirms the above findings No noncontrast CT evidence of an acute intracranial mallet. These results were called by telephone at the time of interpretation on 07/20/2023 at 7:06 pm to provider DAVID [facility] , who verbally acknowledged these results. CTA NECK FINDINGS Aortic arch: Streak/beam hardening artifact arising from a dense contrast bolus partially obscures the aortic arch and bilateral subclavian arteries. Within this limitation, findings are as follows. Atherosclerotic plaque within the visualized aortic arch and proximal major branch vessels of the neck. No appreciable hemodynamically significant innominate  or proximal subclavian artery stenosis. Right carotid system: CCA and ICA patent within the neck without hemodynamically significant stenosis (50% or greater). Mild atherosclerotic plaque about the carotid bifurcation and within the proximal ICA. Left carotid system: CCA and ICA patent within the neck. A sclerotic plaque about the carotid bifurcation and within the proximal ICA resulting in less than 50% stenosis of the proximal ICA Vertebral arteries: Codominant and patent within the neck without stenosis. Skeleton: Grade 1 anterolisthesis at C2-C3, C3-C4, C4-C5, C7-T1, T1-T2 and T2-T3. Spondylosis at the cervical and visualized upper thoracic levels. Other neck: No neck mass or cervical lymphadenopathy. Upper chest: No consolidation within the imaged lung apices. Review of the MIP images confirms the above findings CTA HEAD FINDINGS Anterior circulation: The intracranial internal carotid arteries are patent. Atherosclerotic plaque within both vessels with no more than mild stenosis. The M1 middle cerebral arteries are patent. No M2 proximal branch occlusion or high-grade proximal stenosis. The anterior cerebral arteries are patent. Hypoplastic right A1 segment. No intracranial aneurysm is identified. Posterior circulation: The intracranial vertebral arteries are patent. The basilar artery is patent. The right posterior cerebral artery is occluded proximally (beginning at the P1/P2 junction (series 10, image 20). There is some reconstitution of enhancement within right PCA branches at the distal P2 segment level. Left PCA is patent. 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 A left posterior communicating artery is present. The right posterior communicating artery is diminutive or absent. Venous sinuses: Within the limitations of contrast timing, no convincing thrombus. Anatomic variants: As described. 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 This result was called by telephone at the time of interpretation on 07/20/2023 at 7:07 pm to provider DAVID [facility] , who verbally acknowledged these results. IMPRESSION: Non-contrast head CT: 1. No acute intracranial hemorrhage or evidence of an acute infarct. 2. 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 3. Background parenchymal atrophy and chronic small vessel ischemic disease. CTA neck: 1. Streak/beam hardening artifact arising from a dense contrast bolus partially obscures the aortic arch and bilateral subclavian arteries. 2. The common carotid and internal carotid arteries are patent within the neck. Atherosclerotic plaque bilaterally, as described. Most notably, atherosclerotic plaque results in less than 50% stenosis of the proximal left internal carotid artery. 3. The vertebral arteries are patent within the neck without stenosis. 4. Aortic Atherosclerosis (ICD10-I70.0). CTA head: 1. 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 There is some reconstitution of enhancement within right posterior cerebral artery branches beginning at the distal  P2 segment level. 2. 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 3. Atherosclerotic plaque within the intracranial internal carotid arteries with no more than mild stenosis. Electronically Signed   By: Bascom Lily D.O.   On: 07/20/2023 19:32     TODAY-DAY OF DISCHARGE:  Subjective:   George Bruce today has no headache,no chest abdominal pain,no new weakness tingling or numbness, feels much better wants to go home today.   Objective:   Blood pressure 101/63, pulse 65, temperature (!) 97.3 F (36.3 C), temperature source Oral, resp.  rate (!) 23, SpO2 98%.  Intake/Output Summary (Last 24 hours) at 07/22/2023 1622 Last data filed at 07/22/2023 0730 Gross per 24 hour  Intake 840 ml  Output 350 ml  Net 490 ml   There were no vitals filed for this visit.  Exam: Awake Alert, Oriented *3, No new F.N deficits, Normal affect [place].AT,PERRAL Supple Neck,No JVD, No cervical lymphadenopathy appriciated.  Symmetrical Chest wall movement, Good air movement bilaterally, CTAB RRR,No Gallops,Rubs or new Murmurs, No Parasternal Heave +ve B.Sounds, Abd Soft, Non tender, No organomegaly appriciated, No rebound -guarding or rigidity. No Cyanosis, Clubbing or edema, No new Rash or bruise   PERTINENT RADIOLOGIC STUDIES: ECHOCARDIOGRAM COMPLETE Result Date: 07/22/2023    ECHOCARDIOGRAM REPORT   Patient Name:   George Bruce Date of Exam: 07/22/2023 Medical Rec #:  [id number]       Height:       69.0 in Accession #:    [id number]      Weight:       178.0 lb Date of Birth:  Apr 05, 1945        BSA:          1.966 m Patient Age:    78 years        BP:           143/83 mmHg Patient Gender: M               HR:           69 bpm. Exam Location:  Inpatient Procedure: 2D Echo, Cardiac Doppler and Color Doppler (Both Spectral and Color            Flow Doppler were utilized during procedure). Indications:    Stroke  History:        Patient has no prior history of Echocardiogram examinations.                 Risk Factors:Former Smoker and Hypertension.  Sonographer:    Willey Harrier Referring Phys: Mayo Speck Tylene Galla M Dunbar Buras IMPRESSIONS  1. Left ventricular ejection fraction, by estimation, is 65 to 70%. The left ventricle has normal function. The left ventricle has no regional wall motion abnormalities. There is mild left ventricular hypertrophy. Left ventricular diastolic parameters are consistent with Grade I diastolic dysfunction (impaired relaxation).  2. Right ventricular systolic function is normal. The right ventricular size is mildly enlarged. There is normal  pulmonary artery systolic pressure. The estimated right ventricular systolic pressure is 23.4 mmHg.  3. Left atrial size was moderately dilated.  4. Right atrial size was moderately dilated.  5. The mitral valve is degenerative. Trivial mitral valve regurgitation. No evidence of mitral stenosis.  6. The aortic valve is tricuspid. There is mild calcification of the aortic valve. Aortic valve regurgitation is not visualized. Mild aortic valve stenosis. Aortic valve area, by VTI measures 2.16 cm. Aortic valve mean gradient  measures 10.0 mmHg. Aortic valve Vmax measures 2.38 m/s.  7. The inferior vena cava is normal in size with greater than 50% respiratory variability, suggesting right atrial pressure of 3 mmHg. Conclusion(s)/Recommendation(s): No intracardiac source of embolism detected on this transthoracic study. Consider a transesophageal echocardiogram to exclude cardiac source of embolism if clinically indicated. FINDINGS  Left Ventricle: Left ventricular ejection fraction, by estimation, is 65 to 70%. The left ventricle has normal function. The left ventricle has no regional wall motion abnormalities. The left ventricular internal cavity size was normal in size. There is  mild left ventricular hypertrophy. Left ventricular diastolic parameters are consistent with Grade I diastolic dysfunction (impaired relaxation). Right Ventricle: The right ventricular size is mildly enlarged. No increase in right ventricular wall thickness. Right ventricular systolic function is normal. There is normal pulmonary artery systolic pressure. The tricuspid regurgitant velocity is 2.26  m/s, and with an assumed right atrial pressure of 3 mmHg, the estimated right ventricular systolic pressure is 23.4 mmHg. Left Atrium: Left atrial size was moderately dilated. Right Atrium: Right atrial size was moderately dilated. Pericardium: There is no evidence of pericardial effusion. Mitral Valve: The mitral valve is degenerative in  appearance. Mild to moderate mitral annular calcification. Trivial mitral valve regurgitation. No evidence of mitral valve stenosis. MV peak gradient, 6.2 mmHg. The mean mitral valve gradient is 2.0 mmHg. Tricuspid Valve: The tricuspid valve is normal in structure. Tricuspid valve regurgitation is trivial. No evidence of tricuspid stenosis. Aortic Valve: The aortic valve is tricuspid. There is mild calcification of the aortic valve. Aortic valve regurgitation is not visualized. Mild aortic stenosis is present. Aortic valve mean gradient measures 10.0 mmHg. Aortic valve peak gradient measures 22.7 mmHg. Aortic valve area, by VTI measures 2.16 cm. Pulmonic Valve: The pulmonic valve was normal in structure. Pulmonic valve regurgitation is trivial. No evidence of pulmonic stenosis. Aorta: The aortic root is normal in size and structure. Ascending aorta measurements are within normal limits for age when indexed to body surface area. Venous: The inferior vena cava is normal in size with greater than 50% respiratory variability, suggesting right atrial pressure of 3 mmHg. IAS/Shunts: No atrial level shunt detected by color flow Doppler.  LEFT VENTRICLE PLAX 2D LVIDd:         5.00 cm   Diastology LVIDs:         2.60 cm   LV e' medial:    7.83 cm/s LV PW:         1.00 cm   LV E/e' medial:  10.8 LV IVS:        1.00 cm   LV e' lateral:   8.49 cm/s LVOT diam:     2.00 cm   LV E/e' lateral: 10.0 LV SV:         88 LV SV Index:   45 LVOT Area:     3.14 cm  RIGHT VENTRICLE             IVC RV Basal diam:  4.70 cm     IVC diam: 1.70 cm RV S prime:     22.00 cm/s TAPSE (M-mode): 2.6 cm LEFT ATRIUM             Index        RIGHT ATRIUM           Index LA Vol (A2C):   65.7 ml 33.42 ml/m  RA Area:     20.40 cm LA Vol (A4C):   54.7 ml 27.82 ml/m  RA Volume:   59.60 ml  30.31 ml/m LA Biplane Vol: 64.5 ml 32.80 ml/m  AORTIC VALVE AV Area (Vmax):    1.86 cm AV Area (Vmean):   2.12 cm AV Area (VTI):     2.16 cm AV Vmax:            238.00 cm/s AV Vmean:          139.000 cm/s AV VTI:            0.408 m AV Peak Grad:      22.7 mmHg AV Mean Grad:      10.0 mmHg LVOT Vmax:         141.00 cm/s LVOT Vmean:        93.900 cm/s LVOT VTI:          0.281 m LVOT/AV VTI ratio: 0.69  AORTA Ao Root diam: 3.20 cm Ao Asc diam:  3.80 cm MITRAL VALVE                TRICUSPID VALVE MV Area (PHT): 2.06 cm     TR Peak grad:   20.4 mmHg MV Area VTI:   2.45 cm     TR Vmax:        226.00 cm/s MV Peak grad:  6.2 mmHg MV Mean grad:  2.0 mmHg     SHUNTS MV Vmax:       1.24 m/s     Systemic VTI:  0.28 m MV Vmean:      74.6 cm/s    Systemic Diam: 2.00 cm MV Decel Time: 368 msec MV E velocity: 84.90 cm/s MV A velocity: 114.00 cm/s MV E/A ratio:  0.74 Grady Lawman MD Electronically signed by Grady Lawman MD Signature Date/Time: 07/22/2023/3:43:54 PM    Final    MR BRAIN WO CONTRAST Result Date: 07/21/2023 CLINICAL DATA:  78 year old male with neurologic deficit. Code stroke presentation yesterday. EXAM: MRI HEAD WITHOUT CONTRAST TECHNIQUE: Multiplanar, multiecho pulse sequences of the brain and surrounding structures were obtained without intravenous contrast. COMPARISON:  CT head, CTA, CTP yesterday.  Brain MRI 02/22/2008. FINDINGS: Brain: Asymmetric, abnormal diffusion in the right hippocampal formation including the body and tail. But also patchy restricted diffusion in the lateral right thalamus near the posterior limb external capsule (series 9, image 76, series 10, image 26). Patchy T2 and FLAIR hyperintensity in both areas. No acute hemorrhage or mass effect. No other abnormal diffusion in the right PCA territory. Questionable faint abnormal diffusion also in the right posterior corona radiata on series 9, image 81 although might be T2 shine through. Nearby chronic lacunar infarct of the right caudate. Cerebral volume loss since 2009 appears fairly generalized. Progressed T2 heterogeneity in the bilateral deep gray nuclei since that time. And scattered  increased bilateral cerebral white matter T2 and FLAIR hyperintensity. But no cortical encephalomalacia and no chronic cerebral blood products identified. Brainstem and cerebellum remain normal for age. No midline shift, mass effect, evidence of mass lesion, ventriculomegaly, extra-axial collection or acute intracranial hemorrhage. Cervicomedullary junction and pituitary are within normal limits. Vascular: Major intracranial vascular flow voids are stable since 2009. Skull and upper cervical spine: Visualized bone marrow signal is within normal limits. Partially visible cervical disc protrusion at C4-C5 on series 13, image 13. Negative for age upper cervical spine otherwise. Sinuses/Orbits: Postoperative changes to both globes since 2009, otherwise stable and negative. Other: Mastoids are clear. Grossly normal visible internal auditory structures. Negative visible scalp and face. IMPRESSION: 1. Positive for Acute lacunar  infarct of the lateral Right Thalamus, but also abnormal diffusion throughout the right hippocampal formation. Both are Right PCA territory, although superimposed seizure related changes of the right hippocampus would be difficult to exclude. No associated hemorrhage or mass effect. 2. 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 No other acute intracranial abnormality identified. Electronically Signed   By: Marlise Simpers M.D.   On: 07/21/2023 05:51   CT CEREBRAL PERFUSION W CONTRAST Result Date: 07/20/2023 CLINICAL DATA:  Stroke EXAM: CT PERFUSION BRAIN TECHNIQUE: Multiphase CT imaging of the brain was performed following IV bolus contrast injection. Subsequent parametric perfusion maps were calculated using RAPID software. RADIATION DOSE REDUCTION: This exam was performed according to the departmental dose-optimization program which includes automated exposure control, adjustment of the mA  and/or kV according to patient size and/or use of iterative reconstruction technique. CONTRAST:  50mL OMNIPAQUE  IOHEXOL  350 MG/ML SOLN COMPARISON:  None Available. FINDINGS: CT Brain Perfusion Findings: CBF (<30%) Volume: 0mL Perfusion (Tmax>6.0s) volume: 0mL Mismatch Volume: 0mL Infarct Core: 0 mL Infarction Location:None IMPRESSION: No emergent large vessel occlusion or high-grade stenosis of the intracranial arteries. Electronically Signed   By: Juanetta Nordmann M.D.   On: 07/20/2023 19:44   CT HEAD CODE STROKE WO CONTRAST Result Date: 07/20/2023 CLINICAL DATA:  Code stroke. Neuro deficit, acute, stroke suspected. EXAM: CT ANGIOGRAPHY HEAD AND NECK TECHNIQUE: Multidetector CT imaging of the head and neck was performed using the standard protocol during bolus administration of intravenous contrast. Multiplanar CT image reconstructions and MIPs were obtained to evaluate the vascular anatomy. Carotid stenosis measurements (when applicable) are obtained utilizing NASCET criteria, using the distal internal carotid diameter as the denominator. RADIATION DOSE REDUCTION: This exam was performed according to the departmental dose-optimization program which includes automated exposure control, adjustment of the mA and/or kV according to patient size and/or use of iterative reconstruction technique. CONTRAST:  75mL OMNIPAQUE  IOHEXOL  350 MG/ML SOLN COMPARISON:  Brain MRI 02/22/2008. FINDINGS: CT HEAD FINDINGS Brain: Generalized cerebral atrophy. 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 Mild patchy and ill-defined hypoattenuation within the cerebral white matter, nonspecific but compatible with chronic small vessel ischemic disease. There is no acute intracranial hemorrhage. No demarcated cortical infarct. No extra-axial fluid collection. No evidence of an intracranial mass. No midline shift. Vascular: No hyperdense vessel. Atherosclerotic calcifications. Skull: No calvarial fracture or  aggressive osseous lesion. Sinuses/Orbits: No orbital mass or acute orbital finding. No significant paranasal sinus disease. ASPECTS ([place] Stroke Program Early CT Score) - Ganglionic level infarction (caudate, lentiform nuclei, internal capsule, insula, M1-M3 cortex): 7 - Supraganglionic infarction (M4-M6 cortex): 3 Total score (0-10 with 10 being normal): 10 Review of the MIP images confirms the above findings No noncontrast CT evidence of an acute intracranial mallet. These results were called by telephone at the time of interpretation on 07/20/2023 at 7:06 pm to provider DAVID [facility] , who verbally acknowledged these results. CTA NECK FINDINGS Aortic arch: Streak/beam hardening artifact arising from a dense contrast bolus partially obscures the aortic arch and bilateral subclavian arteries. Within this limitation, findings are as follows. Atherosclerotic plaque within the visualized aortic arch and proximal major branch vessels of the neck. No appreciable hemodynamically significant innominate or proximal subclavian artery stenosis. Right carotid system: CCA and ICA patent within the neck without hemodynamically significant stenosis (50% or greater). Mild atherosclerotic plaque about the carotid bifurcation and within the proximal ICA. Left  carotid system: CCA and ICA patent within the neck. A sclerotic plaque about the carotid bifurcation and within the proximal ICA resulting in less than 50% stenosis of the proximal ICA Vertebral arteries: Codominant and patent within the neck without stenosis. Skeleton: Grade 1 anterolisthesis at C2-C3, C3-C4, C4-C5, C7-T1, T1-T2 and T2-T3. Spondylosis at the cervical and visualized upper thoracic levels. Other neck: No neck mass or cervical lymphadenopathy. Upper chest: No consolidation within the imaged lung apices. Review of the MIP images confirms the above findings CTA HEAD FINDINGS Anterior circulation: The intracranial internal carotid arteries are patent.  Atherosclerotic plaque within both vessels with no more than mild stenosis. The M1 middle cerebral arteries are patent. No M2 proximal branch occlusion or high-grade proximal stenosis. The anterior cerebral arteries are patent. Hypoplastic right A1 segment. No intracranial aneurysm is identified. Posterior circulation: The intracranial vertebral arteries are patent. The basilar artery is patent. The right posterior cerebral artery is occluded proximally (beginning at the P1/P2 junction (series 10, image 20). There is some reconstitution of enhancement within right PCA branches at the distal P2 segment level. Left PCA is patent. 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 A left posterior communicating artery is present. The right posterior communicating artery is diminutive or absent. Venous sinuses: Within the limitations of contrast timing, no convincing thrombus. Anatomic variants: As described. 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 This result was called by telephone at the time of interpretation on 07/20/2023 at 7:07 pm to provider DAVID [facility] , who verbally acknowledged these results. IMPRESSION: Non-contrast head CT: 1. No acute intracranial hemorrhage or evidence of an acute infarct. 2. 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 3. Background parenchymal atrophy and chronic small vessel ischemic disease. CTA neck: 1. Streak/beam hardening artifact arising from a dense contrast bolus partially obscures the aortic arch and bilateral subclavian arteries. 2. The common carotid and internal carotid arteries are patent within the neck. Atherosclerotic plaque bilaterally, as described. Most notably, atherosclerotic plaque results in less than 50% stenosis of the proximal left  internal carotid artery. 3. The vertebral arteries are patent within the neck without stenosis. 4. Aortic Atherosclerosis (ICD10-I70.0). CTA head: 1. 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 There is some reconstitution of enhancement within right posterior cerebral artery branches beginning at the distal P2 segment level. 2. 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 3. Atherosclerotic plaque within the intracranial internal carotid arteries with no more than mild stenosis. Electronically Signed   By: Bascom Lily D.O.   On: 07/20/2023 19:32   CT ANGIO HEAD NECK W WO CM (CODE STROKE) Result Date: 07/20/2023 CLINICAL DATA:  Code stroke. Neuro deficit, acute, stroke suspected. EXAM: CT ANGIOGRAPHY HEAD AND NECK TECHNIQUE: Multidetector CT imaging of the head and neck was performed using the standard protocol during bolus administration of intravenous contrast. Multiplanar CT image reconstructions and MIPs were obtained to evaluate the vascular anatomy. Carotid stenosis measurements (when applicable) are obtained utilizing NASCET criteria, using the distal internal carotid diameter as the denominator. RADIATION DOSE REDUCTION: This exam was performed according to the departmental dose-optimization program which includes automated exposure  control, adjustment of the mA and/or kV according to patient size and/or use of iterative reconstruction technique. CONTRAST:  75mL OMNIPAQUE  IOHEXOL  350 MG/ML SOLN COMPARISON:  Brain MRI 02/22/2008. FINDINGS: CT HEAD FINDINGS Brain: Generalized cerebral atrophy. 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 Mild patchy and ill-defined hypoattenuation within the cerebral white matter, nonspecific but compatible with chronic small vessel ischemic disease. There is no acute  intracranial hemorrhage. No demarcated cortical infarct. No extra-axial fluid collection. No evidence of an intracranial mass. No midline shift. Vascular: No hyperdense vessel. Atherosclerotic calcifications. Skull: No calvarial fracture or aggressive osseous lesion. Sinuses/Orbits: No orbital mass or acute orbital finding. No significant paranasal sinus disease. ASPECTS ([place] Stroke Program Early CT Score) - Ganglionic level infarction (caudate, lentiform nuclei, internal capsule, insula, M1-M3 cortex): 7 - Supraganglionic infarction (M4-M6 cortex): 3 Total score (0-10 with 10 being normal): 10 Review of the MIP images confirms the above findings No noncontrast CT evidence of an acute intracranial mallet. These results were called by telephone at the time of interpretation on 07/20/2023 at 7:06 pm to provider DAVID [facility] , who verbally acknowledged these results. CTA NECK FINDINGS Aortic arch: Streak/beam hardening artifact arising from a dense contrast bolus partially obscures the aortic arch and bilateral subclavian arteries. Within this limitation, findings are as follows. Atherosclerotic plaque within the visualized aortic arch and proximal major branch vessels of the neck. No appreciable hemodynamically significant innominate or proximal subclavian artery stenosis. Right carotid system: CCA and ICA patent within the neck without hemodynamically significant stenosis (50% or greater). Mild atherosclerotic plaque about the carotid bifurcation and within the proximal ICA. Left carotid system: CCA and ICA patent within the neck. A sclerotic plaque about the carotid bifurcation and within the proximal ICA resulting in less than 50% stenosis of the proximal ICA Vertebral arteries: Codominant and patent within the neck without stenosis. Skeleton: Grade 1 anterolisthesis at C2-C3, C3-C4, C4-C5, C7-T1, T1-T2 and T2-T3. Spondylosis at the cervical and visualized upper thoracic levels. Other neck: No neck mass or  cervical lymphadenopathy. Upper chest: No consolidation within the imaged lung apices. Review of the MIP images confirms the above findings CTA HEAD FINDINGS Anterior circulation: The intracranial internal carotid arteries are patent. Atherosclerotic plaque within both vessels with no more than mild stenosis. The M1 middle cerebral arteries are patent. No M2 proximal branch occlusion or high-grade proximal stenosis. The anterior cerebral arteries are patent. Hypoplastic right A1 segment. No intracranial aneurysm is identified. Posterior circulation: The intracranial vertebral arteries are patent. The basilar artery is patent. The right posterior cerebral artery is occluded proximally (beginning at the P1/P2 junction (series 10, image 20). There is some reconstitution of enhancement within right PCA branches at the distal P2 segment level. Left PCA is patent. 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 A left posterior communicating artery is present. The right posterior communicating artery is diminutive or absent. Venous sinuses: Within the limitations of contrast timing, no convincing thrombus. Anatomic variants: As described. 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 This result was called by telephone at the time of interpretation on 07/20/2023 at 7:07 pm to provider DAVID [facility] , who verbally acknowledged these results. IMPRESSION: Non-contrast head CT: 1. No acute intracranial hemorrhage or evidence of an acute infarct. 2. 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 3. Background parenchymal atrophy and chronic small vessel ischemic disease. CTA neck: 1. Streak/beam hardening artifact arising from a dense contrast bolus partially obscures the aortic arch and bilateral subclavian  arteries. 2. The common carotid and internal carotid arteries are patent within the neck. Atherosclerotic plaque bilaterally, as described. Most notably, atherosclerotic plaque results in less than 50% stenosis of the proximal left internal carotid artery. 3. The vertebral arteries are patent within the neck without stenosis. 4. Aortic Atherosclerosis (ICD10-I70.0). CTA head: 1. 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 There is some reconstitution of enhancement within right posterior cerebral artery branches beginning at the distal P2 segment level. 2. 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 3. Atherosclerotic plaque within the intracranial internal carotid arteries with no more than mild stenosis. Electronically Signed   By: Bascom Lily D.O.   On: 07/20/2023 19:32     PERTINENT LAB RESULTS: CBC: Recent Labs    07/20/23 1829 07/21/23 0508  WBC 10.5 10.3  HGB 15.6 14.4  HCT 47.2 42.8  PLT 193 154   CMET CMP     Component Value Date/Time   NA 141 07/21/2023 0508   K 3.9 07/21/2023 0508   CL 109 07/21/2023 0508   CO2 25 07/21/2023 0508   GLUCOSE 85 07/21/2023 0508   BUN 18 07/21/2023 0508   CREATININE 1.04 07/21/2023 0508   CALCIUM 8.3 (L) 07/21/2023 0508   PROT 5.0 (L) 07/21/2023 0508   ALBUMIN  3.0 (L) 07/21/2023 0508   AST 17 07/21/2023 0508   ALT 16 07/21/2023 0508   ALKPHOS 41 07/21/2023 0508   BILITOT 0.7 07/21/2023 0508   GFRNONAA >60 07/21/2023 0508    GFR Estimated Creatinine Clearance: 56.7 mL/min (by C-G formula based on SCr of 1.04 mg/dL). No results for input(s): "LIPASE", "AMYLASE" in the last 72 hours. No results for input(s): "CKTOTAL", "CKMB", "CKMBINDEX", "TROPONINI" in the last 72 hours. Invalid input(s): "POCBNP" No results for input(s): "DDIMER" in the last 72 hours. Recent Labs     07/21/23 0508  HGBA1C 5.6   Recent Labs    07/21/23 0508  CHOL 103  HDL 37*  LDLCALC 53  TRIG 65  CHOLHDL 2.8   No results for input(s): "TSH", "T4TOTAL", "T3FREE", "THYROIDAB" in the last 72 hours.  Invalid input(s): "FREET3" No results for input(s): "VITAMINB12", "FOLATE", "FERRITIN", "TIBC", "IRON", "RETICCTPCT" in the last 72 hours. Coags: Recent Labs    07/20/23 1829  INR 1.0   Microbiology: No results found for this or any previous visit (from the past 240 hours).  FURTHER DISCHARGE INSTRUCTIONS:  Get Medicines reviewed and adjusted: Please take all your medications with you for your next visit with your Primary MD  Laboratory/radiological data: Please request your Primary MD to go over all hospital tests and procedure/radiological results at the follow up, please ask your Primary MD to get all Hospital records sent to his/her office.  In some cases, they will be blood work, cultures and biopsy results pending at the time of your discharge. Please request that your primary care M.D. goes through all the records of your hospital data and follows up on these results.  Also Note the following: If you experience worsening of your admission symptoms, develop shortness of breath, life threatening emergency, suicidal or homicidal thoughts you must seek medical  attention immediately by calling 911 or calling your MD immediately  if symptoms less severe.  You must read complete instructions/literature along with all the possible adverse reactions/side effects for all the Medicines you take and that have been prescribed to you. Take any new Medicines after you have completely understood and accpet all the possible adverse reactions/side effects.   Do not drive when taking Pain medications or sleeping medications (Benzodaizepines)  Do not take more than prescribed Pain, Sleep and Anxiety Medications. It is not advisable to combine anxiety,sleep and pain medications without  talking with your primary care practitioner  Special Instructions: If you have smoked or chewed Tobacco  in the last 2 yrs please stop smoking, stop any regular Alcohol  and or any Recreational drug use.  Wear Seat belts while driving.  Please note: You were cared for by a hospitalist during your hospital stay. Once you are discharged, your primary care physician will handle any further medical issues. Please note that NO REFILLS for any discharge medications will be authorized once you are discharged, as it is imperative that you return to your primary care physician (or establish a relationship with a primary care physician if you do not have one) for your post hospital discharge needs so that they can reassess your need for medications and monitor your lab values.  Total Time spent coordinating discharge including counseling, education and face to face time equals greater than 30 minutes.  SignedKimberly Penna 07/22/2023 4:22 PM

## 2023-07-22 NOTE — Progress Notes (Signed)
 STROKE TEAM PROGRESS NOTE   INTERIM HISTORY/SUBJECTIVE Wife at the bedside. Pt lying in bed, left arm weakness and ataxia much improved. Still has mild left sided numbness. Pending echo   CBC    Component Value Date/Time   WBC 10.3 07/21/2023 0508   RBC 4.87 07/21/2023 0508   HGB 14.4 07/21/2023 0508   HCT 42.8 07/21/2023 0508   PLT 154 07/21/2023 0508   MCV 87.9 07/21/2023 0508   MCH 29.6 07/21/2023 0508   MCHC 33.6 07/21/2023 0508   RDW 13.9 07/21/2023 0508   LYMPHSABS 1.3 07/20/2023 1829   MONOABS 0.5 07/20/2023 1829   EOSABS 0.0 07/20/2023 1829   BASOSABS 0.0 07/20/2023 1829    BMET    Component Value Date/Time   NA 141 07/21/2023 0508   K 3.9 07/21/2023 0508   CL 109 07/21/2023 0508   CO2 25 07/21/2023 0508   GLUCOSE 85 07/21/2023 0508   BUN 18 07/21/2023 0508   CREATININE 1.04 07/21/2023 0508   CALCIUM 8.3 (L) 07/21/2023 0508   GFRNONAA >60 07/21/2023 0508    IMAGING past 24 hours No results found.   Vitals:   07/22/23 0310 07/22/23 0730 07/22/23 0903 07/22/23 1152  BP: (!) 156/92  (!) 161/98 (!) 155/143  Pulse: 65   65  Resp: 18   17  Temp: 97.7 F (36.5 C)   (!) 97.3 F (36.3 C)  TempSrc:    Oral  SpO2: 94% 98%  98%     PHYSICAL EXAM General:  Alert, well-nourished, well-developed patient in no acute distress Psych:  Mood and affect appropriate for situation CV: Regular rate and rhythm on monitor Respiratory:  Regular, unlabored respirations on room air GI: Abdomen soft and nontender   NEURO:  Mental Status: AA&Ox3, patient is able to give clear and coherent history Speech/Language: speech is without dysarthria or aphasia.  Naming, repetition, fluency, and comprehension intact.  Cranial Nerves:  II: PERRL. Visual fields full.  III, IV, VI: EOMI. Eyelids elevate symmetrically.  V: Sensation is intact to light touch and symmetrical to face.  VII: Face is symmetrical resting and smiling VIII: hearing intact to voice. IX, X: Palate elevates  symmetrically. Phonation is normal.  UJ:[id number] shrug 5/5. XII: tongue is midline without fasciculations. Motor: 5/5 strength in right upper, and bilateral lowers with slight drift in left lower leg.  Left arm 5-/5 without drift Tone: is normal and bulk is normal Sensation-decreased sensation on the left extinction absent to light touch to DSS.   Coordination: FTN intact bilaterally, HKS: no ataxia in BLE.No drift.  Gait- deferred  Most Recent NIH 4   ASSESSMENT/PLAN  [name] is a 78 y.o. male with history of hyperlipidemia, BPH presents to the ER with complaints of left-sided weakness. 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 2D Echo pending LDL 53 HgbA1c 5.6 VTE prophylaxis -Lovenox  No antithrombotic prior to admission, now on aspirin  81 mg daily and clopidogrel 75 mg daily for 3 months and then aspirin  alone. 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  Other Active Problems BPH Left sciatica - following with [facility] for pain  management  Hospital day # 1  Neurology will sign off. Please call with questions. Pt will follow up with [facility] neurology in about 4 weeks. Thanks for the consult.   Consuelo Denmark, MD PhD Stroke Neurology 07/22/2023 12:56 PM    To contact Stroke Continuity provider, please refer to [email or web address]. After hours, contact General Neurology

## 2023-07-22 NOTE — Progress Notes (Signed)
 Brief note Seen and examined Chart reviewed Left-sided weakness is essentially resolved Echo still pending-once done-suspect plan is discharge home later today with outpatient if physical therapy.  Per neurology aspirin /Plavix x 3 months followed by aspirin  alone, has been switched from simvastatin  to atorvastatin.

## 2023-07-22 NOTE — TOC Progression Note (Signed)
 Transition of Care [facility]) - Progression Note    Patient Details  Name: George Bruce MRN: [id number] Date of Birth: [birth year]  [facility] CM/SW Contact  Eusebio High, RN Phone Number: 07/22/2023, 12:31 PM  Clinical Narrative:     Patient has requested  the recommended OPPT be done at [facility] in [place]. Referral entered and hard copy of referral given to patient  TOC will continue to follow patient for any additional discharge needs            Expected Discharge Plan and Services                                               Social Determinants of Health (SDOH) Interventions SDOH Screenings   Food Insecurity: No Food Insecurity (11/06/2022)   Received from [facility]  Housing: Unknown (05/14/2023)   Received from [facility]  Transportation Needs: No Transportation Needs (11/06/2022)   Received from [facility]  Utilities: Not At Risk (11/06/2022)   Received from [facility]  Financial Resource Strain: Low Risk  (11/06/2022)   Received from [facility]  Tobacco Use: Medium Risk (07/21/2023)    Readmission Risk Interventions     No data to display

## 2023-07-22 NOTE — Plan of Care (Signed)
  Problem: Education: Goal: Knowledge of disease or condition will improve Outcome: Progressing   Problem: Ischemic Stroke/TIA Tissue Perfusion: Goal: Complications of ischemic stroke/TIA will be minimized Outcome: Progressing   Problem: Coping: Goal: Will verbalize positive feelings about self Outcome: Progressing   Problem: Health Behavior/Discharge Planning: Goal: Goals will be collaboratively established with patient/family Outcome: Progressing   Problem: Self-Care: Goal: Ability to communicate needs accurately will improve Outcome: Progressing   Problem: Education: Goal: Knowledge of General Education information will improve Description: Including pain rating scale, medication(s)/side effects and non-pharmacologic comfort measures Outcome: Progressing   Problem: Clinical Measurements: Goal: Will remain free from infection Outcome: Progressing Goal: Respiratory complications will improve Outcome: Progressing Goal: Cardiovascular complication will be avoided Outcome: Progressing   Problem: Activity: Goal: Risk for activity intolerance will decrease Outcome: Progressing

## 2023-07-30 ENCOUNTER — Other Ambulatory Visit: Payer: Self-pay

## 2023-07-30 ENCOUNTER — Encounter: Payer: Self-pay | Admitting: Physical Therapy

## 2023-07-30 ENCOUNTER — Ambulatory Visit: Attending: Internal Medicine | Admitting: Physical Therapy

## 2023-07-30 DIAGNOSIS — R269 Unspecified abnormalities of gait and mobility: Secondary | ICD-10-CM | POA: Diagnosis present

## 2023-07-30 DIAGNOSIS — M5432 Sciatica, left side: Secondary | ICD-10-CM | POA: Insufficient documentation

## 2023-07-30 DIAGNOSIS — I69352 Hemiplegia and hemiparesis following cerebral infarction affecting left dominant side: Secondary | ICD-10-CM | POA: Diagnosis present

## 2023-07-30 DIAGNOSIS — M6281 Muscle weakness (generalized): Secondary | ICD-10-CM | POA: Diagnosis present

## 2023-07-30 DIAGNOSIS — R208 Other disturbances of skin sensation: Secondary | ICD-10-CM | POA: Diagnosis present

## 2023-07-30 DIAGNOSIS — R2681 Unsteadiness on feet: Secondary | ICD-10-CM | POA: Diagnosis present

## 2023-07-30 NOTE — Therapy (Signed)
 OUTPATIENT PHYSICAL THERAPY NEURO EVALUATION   Patient Name: George Bruce MRN: [id number] DOB:November 01, 1945, 78 y.o., male Today's Date: 07/31/2023   PCP: Yehuda Helms, MD  REFERRING PROVIDER: Yehuda Helms, MD   END OF SESSION:   PT End of Session - 07/30/23 0933     Visit Number 1    Number of Visits 24    Date for PT Re-Evaluation 10/22/23    PT Start Time 0933    PT Stop Time 1018    PT Time Calculation (min) 45 min    Equipment Utilized During Treatment Gait belt    Activity Tolerance Patient tolerated treatment well    Behavior During Therapy WFL for tasks assessed/performed             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   Hypertension    Wears dentures    partial upper and lower   Past Surgical History:  Procedure Laterality Date   CATARACT EXTRACTION W/PHACO Right 09/29/2022   Procedure: CATARACT EXTRACTION PHACO AND INTRAOCULAR LENS PLACEMENT (IOC) RIGHT  10.98  00:56.5;  Surgeon: Rosa College, MD;  Location: [facility];  Service: Ophthalmology;  Laterality: Right;   CATARACT EXTRACTION W/PHACO Left 10/13/2022   Procedure: CATARACT EXTRACTION PHACO AND INTRAOCULAR LENS PLACEMENT (IOC) LEFT 10.79 00:54.1;  Surgeon: Rosa College, MD;  Location: [facility];  Service: Ophthalmology;  Laterality: Left;   COLONOSCOPY WITH PROPOFOL  N/A 04/21/2018   Procedure: COLONOSCOPY WITH PROPOFOL ;  Surgeon: Irby Mannan, MD;  Location: [facility];  Service: Endoscopy;  Laterality: N/A;   INGUINAL HERNIA REPAIR Left 1990's   INGUINAL HERNIA REPAIR Right 2015   REVERSE SHOULDER ARTHROPLASTY Left 11/06/2015   Procedure: REVERSE SHOULDER ARTHROPLASTY;  Surgeon: Elner Hahn, MD;  Location: [facility];   Service: Orthopedics;  Laterality: Left;   REVERSE SHOULDER ARTHROPLASTY Right 03/25/2022   Procedure: REVERSE SHOULDER ARTHROPLASTY;  Surgeon: Elner Hahn, MD;  Location: [facility];  Service: Orthopedics;  Laterality: Right;  MAKE 2ND CASE   Patient Active Problem List   Diagnosis Date Noted   Acute CVA (cerebrovascular accident) (HCC) 07/21/2023   Elevated blood pressure reading 07/21/2023   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   Acute diverticulitis 05/07/2018   History of colonic polyps    Benign neoplasm of ascending colon    Diverticulosis of large intestine without diverticulitis    Erectile dysfunction due to arterial insufficiency 02/18/2018   Degenerative disc disease, lumbar 08/26/2016   Bilateral hip pain 06/24/2016   Chronic midline low back pain 06/24/2016   Polyarthralgia 06/24/2016   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   Hematospermia 05/03/2015   Colon polyps 08/16/2013   Hematuria 08/16/2013   Hyperlipidemia 08/16/2013   Incomplete emptying of bladder 06/23/2013   Benign prostatic hyperplasia with incomplete bladder emptying 11/11/2011   Elevated PSA 11/11/2011   Reduced libido 11/11/2011    ONSET DATE: 07/20/2023 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, right PCA occlusion   REFERRING DIAG: I63.50 (ICD-10-CM) - Cerebral infarction due to unspecified occlusion or stenosis of unspecified cerebral artery   THERAPY DIAG:  Hemiplegia and  hemiparesis following cerebral infarction affecting left dominant side (HCC)  Other disturbances of skin sensation  Unsteadiness on feet  Muscle weakness (generalized)  Abnormality of gait  Sciatica, left side  Rationale for Evaluation and Treatment: Rehabilitation  SUBJECTIVE:                                                                                                                                                                                              SUBJECTIVE STATEMENT:  Pt states he has hx of sciatica in L LE. Pt states recent CVA affected his L side and he is having numbness in L UE and LE. Pt states the CVA affected him from his jaw down through his L arm and L LE. Pt states he is left hand dominant. Pt states he was walking ~60miles daily and performing all household chores prior to CVA. Patient states he was very active and completely independent prior.   Pt states he had MRI yesterday for L LE sciatica and is going to see MD tomorrow to follow-up on results (with [facility]). Patient reporting current pain as 6/10 with it being greater this morning, but he took pain medication to ease it off prior to coming to therapy.  Pt accompanied by: significant other, Wife, [name]  PERTINENT HISTORY: PMH of Hyperlipidemia, BPH, HTN, hx of basal cell carcinoma, hx of squamous cell carcinoma, arthritis, R and L reverse shoulder arthroplasties (R in 2024 and L in 2017)  PAIN:  Are you having pain? Yes: NPRS scale: 6/10 after taking pain medication this AM Pain location: L LE sciatica Pain description: severe ache Aggravating factors: he can wake up with it, no specific aggravating factors Relieving factors: pt reports having to take pain medication (hydrocodone ) this AM, hx of having injections and prednisone ; uses horse liniment   PRECAUTIONS: Fall  RED FLAGS: None   WEIGHT BEARING RESTRICTIONS: No  FALLS: Has patient fallen in last 6 months? No and but states had a few stumbles getting his feet tangled up  LIVING ENVIRONMENT: Lives with: lives with their spouse Lives in: House/apartment Stairs: Yes: External: 3+1 steps; on right going up; 1 level house Has following equipment at home: Walker - 2 wheeled and built-in shower seat  PLOF: Independent, Independent with household mobility without device, Independent with homemaking with ambulation, Independent with gait, and Leisure: golf  PATIENT GOALS:  "get back to half normal" being able to do yard work, wash a car, and golf  OBJECTIVE:  Note: Objective measures were completed at Evaluation unless otherwise noted.  DIAGNOSTIC FINDINGS:  EXAM: MRI HEAD WITHOUT CONTRAST   TECHNIQUE: Multiplanar, multiecho pulse  sequences of the brain and surrounding structures were obtained without intravenous contrast.   COMPARISON:  CT head, CTA, CTP yesterday.  Brain MRI 02/22/2008.  IMPRESSION: 1. Positive for Acute lacunar infarct of the lateral Right Thalamus, but also abnormal diffusion throughout the right hippocampal formation. Both are Right PCA territory, although superimposed seizure related changes of the right hippocampus would be difficult to exclude. No associated hemorrhage or mass effect.   2. 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 No other acute intracranial abnormality identified.   Electronically Signed   By: Marlise Simpers M.D.   On: 07/21/2023 05:51  CTA head:   1. 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 There is some reconstitution of enhancement within right posterior cerebral artery branches beginning at the distal P2 segment level. 2. 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 3. Atherosclerotic plaque within the intracranial internal carotid arteries with no more than mild stenosis.     Electronically Signed   By: Bascom Lily D.O.   On: 07/20/2023 19:32   COGNITION: Overall cognitive status: Within functional limits for tasks assessed   SENSATION: Light touch: Impaired  and basically absent Proprioception: Impaired  Can only feel deep pressure in L LE  COORDINATION: Incoordinated in L LE, likely due to sensory  impairments  EDEMA:  Not formally assessed  MUSCLE TONE: Not formally assessed  MUSCLE LENGTH: Not formally assessed  DTRs:  Not formally assessed  POSTURE: rounded shoulders and forward head  LOWER EXTREMITY ROM:     Active  Right Eval  WFL/WNL throughout Left Eval  WFL/WNL throughout  Hip flexion    Hip extension    Hip abduction    Hip adduction    Hip internal rotation    Hip external rotation    Knee flexion    Knee extension    Ankle dorsiflexion    Ankle plantarflexion    Ankle inversion    Ankle eversion     (Blank rows = not tested)  LOWER EXTREMITY MMT:    MMT Right Eval Left Eval  Hip flexion 4+ 3+  Hip extension    Hip abduction    Hip adduction    Hip internal rotation    Hip external rotation    Knee flexion 4+ 4-  Knee extension 4+ 4+  Ankle dorsiflexion 4+ 3  Ankle plantarflexion 4+ 4-  Ankle inversion    Ankle eversion    (Blank rows = not tested)  Manual Muscle Test Scale 0/5 = No muscle contraction can be seen or felt 1/5 = Contraction can be felt, but there is no motion 2-/5 = Part moves through incomplete ROM w/ gravity decreased 2/5 = Part moves through complete ROM w/ gravity decreased 2+/5 = Part moves through incomplete ROM (<50%) against gravity or through complete ROM w/ gravity 3-/5 = Part moves through incomplete ROM (>50%) against gravity 3/5 = Part moves through complete ROM against gravity 3+/5 = Part moves through complete ROM against gravity/slight resistance 4-/5= Holds test position against slight to moderate pressure 4/5 = Part moves through complete ROM against gravity/moderate resistance 4+/5= Holds test position against moderate to strong pressure 5/5 = Part moves through complete ROM against gravity/full resistance  BED MOBILITY:  Not tested, pt denies difficulty with this  TRANSFERS: Sit to stand: SBA and CGA  Assistive device utilized: Environmental consultant - 2 wheeled and None     Stand to sit: SBA and CGA   Assistive device utilized: Environmental consultant - 2 wheeled and None     Chair to chair: SBA and CGA  Assistive device utilized: Environmental consultant - 2 wheeled and None      *requires CGA when not using AD* RAMP:  Not tested  CURB:  Not tested  STAIRS: Findings: Level of Assistance: CGA and Min A, Stair Negotiation Technique: Step to Pattern Forwards with Single Rail on Right, Number of Stairs: 4, Height of Stairs: 6in   , and Comments: incoordinated, but no greater than min A for balance; only used R HR to simulate home environment GAIT: Findings: Gait Characteristics: step through pattern, decreased arm swing- Right, decreased arm swing- Left, decreased step length- Right, decreased step length- Left, decreased stride length, and wide BOS, Distance walked: 28ft + 319ft, Assistive device utilized:None, Level of assistance: CGA and Min A, and Comments: pt uses RW for all ambulation at home, but didn't use AD during session to assess patient's gait without  FUNCTIONAL TESTS:  5 times sit to stand: 15.12 seconds with arms across chest  6 minute walk test: need to assess 10 meter walk test: 0.93 m/s without AD with CGA for safety  Berg Balance Scale: need to assess Functional gait assessment: need to assess   PATIENT SURVEYS:  Stroke Impact Scale 35/80                                                                                                                              TREATMENT DATE: 07/30/2023  Vitals in sitting to start session: BP 124/85 (MAP 97), HR 60bpm, SpO2 100%    Stroke Impact Scale 16 (Copyrighted instrument, [facility])  In the past 2 weeks, how difficult was it to...  Rating Scale 5 = Not difficult at all 4 = A little difficult 3 = Somewhat difficult 2 = Very difficult 1 = Could not do at all  a. Dress the top part of your body? 3  b. Bathe yourself? 3  c. Get to the toilet on time? 3  d. Control your bladder (not have an accident)? 3  e. Control your  bowels (not have an accident)? 3  f. Stand without losing balance? 2  g. Go shopping? 1  h. Do heavy household chores (e.g. vacuum, laundry or  yard work)? 1  i. Stay sitting without losing your balance? 3  j. Walk without losing your balance? 2  k. Move from a bed to a chair? 3  l. Walk fast? 1  m. Climb one flight of stairs? 2  n. Walk one block? 1  o. Get in and out of a car? 3  p. Carry heavy objects (e.g. bag of groceries) with your  affected hand? 1  Sum:  35/80  MDC (Minimal Detectable Change) is >/=8     10 Meter Walk Test: Patient instructed to walk 10 meters (32.8 ft) as quickly and as safely as possible at their normal speed Results: 0.93 m/s (11.04 seconds and 10.33 seconds = AVG 10.69sec) without AD with CGA for safety  Cut off scores:   Household Ambulator  < 0.4 m/s  Limited Community Ambulator  0.4 - 0.8 m/s  [employer]  > 0.8 m/s  Increased fall risk  < 1.61m/s  Crossing a Street  >1.45m/s  MCID 0.05 m/s (small), 0.13 m/s (moderate), 0.06 m/s (significant)  (ANPTA Core Set of Outcome Measures for Adults with Neurologic Conditions, 2018)    Five times Sit to Stand Test (FTSS) "Stand up and sit down as quickly as possible 5 times, keeping your arms folded across your chest."    TIME: 15.12 seconds with arms across chest  Times > 13.6 seconds is associated with increased disability and morbidity (Guralnik, 2000) Times > 15 seconds is predictive of recurrent falls in healthy individuals aged 38 and older (Buatois, et al., 2008) Normal performance values in community dwelling individuals aged 50 and older (Bohannon, 2006): 60-69 years: 11.4 seconds 70-79 years: 12.6 seconds 80-89 years: 14.8 seconds  MCID: >= 2.3 seconds for Vestibular Disorders Reed Canes, 2006)  Educated on walking daily for exercise using RW at this time and performing sit<>stands for B LE strengthening.   Educated pt on results of assessments today and plan for next  session.   PATIENT EDUCATION: Education details: Importance of performing L side skin assessments due to sensory deficits, checking hot/cold using R hand due to sensory deficits, findings of assessment today, exercises to perform at home until formal HEP developed Person educated: Patient and Spouse Education method: Explanation Education comprehension: verbalized understanding and needs further education  HOME EXERCISE PROGRAM: Need to provide formal HEP - educated on walking daily using RW and sit<>stands for exercise  GOALS: Goals reviewed with patient? Yes  SHORT TERM GOALS: Target date: 09/10/2023  Patient will be independent in home exercise program to improve strength/mobility for better functional independence with ADLs and functional mobility.  Baseline: need to initiate Goal status: INITIAL   LONG TERM GOALS: Target date: 10/22/2023  Patient (> 85 years old) will complete five times sit to stand test (5XSTS) in < 12 seconds indicating an increased LE strength and improved balance.  Baseline: 15.12 seconds with arms across chest Goal status: INITIAL  2.  Patient will increase Berg Balance score to > 51/56 to demonstrate improved balance and decreased fall risk during functional activities and ADLs.  Baseline: need to assess Goal status: INITIAL  3.  Patient will increase six minute walk test ( ) distance to >1049ft for progression to community ambulator and improve gait ability  Baseline: need to assess Goal status: INITIAL  4.  Patient will increase 10 meter walk test to >1.33m/s as to improve gait speed for better community ambulation and to reduce fall risk. Baseline: 0.93 m/s without AD with CGA  Goal status: INITIAL  5.  Patient will increase Functional Gait Assessment (FGA) score to >20/30 as to reduce fall risk and improve dynamic gait safety with community ambulation.  Baseline: need to assess Goal status: INITIAL  6.  Patient will improve Stroke Impact  Scale-16 by >9 points indicating patient experiencing increased independence with functional mobility tasks and ADLs to improve quality of life and decrease caregiver burden.  Baseline: 35/80 Goal status: INITIAL  ASSESSMENT:  CLINICAL IMPRESSION: Patient is an 78 y.o. male who was seen today for physical therapy evaluation and treatment for L hemiparesis and L hemisensory impairments following CVA as well as treatment to address pain from L LE sciatica impacting his recovery post-CVA. Patient was completely independent without AD and very active prior to CVA. Patient currently presents with L LE weakness and sensory deficits that impact his transfers, gait, stair/step navigation, and balance. Patient is required to use RW at this time for safety and increased independence. Patient reports decreased independence and overall increased difficulty performing ADLs and functional mobility since CVA as noted on SIS. Patient also demonstrates decreased functional B LE strength and decreased gait speed as noted on 5xSTS and , which indicate pt is at increased fall risk. Mr. Leitzel will benefit from further skilled PT to improve these deficits in order to increase QOL, decrease fall risk, and ease/safety with ADLs.   OBJECTIVE IMPAIRMENTS: Abnormal gait, decreased activity tolerance, decreased balance, decreased coordination, decreased endurance, decreased knowledge of condition, decreased knowledge of use of DME, decreased mobility, difficulty walking, decreased strength, increased fascial restrictions, impaired flexibility, impaired sensation, impaired UE functional use, improper body mechanics, and pain.   ACTIVITY LIMITATIONS: carrying, lifting, bending, sitting, standing, squatting, stairs, transfers, bathing, toileting, dressing, reach over head, hygiene/grooming, and locomotion level  PARTICIPATION LIMITATIONS: meal prep, cleaning, laundry, interpersonal relationship, driving, shopping, community  activity, and yard work  PERSONAL FACTORS: Age, Fitness, Time since onset of injury/illness/exacerbation, and 3+ comorbidities: Hyperlipidemia, BPH, HTN, hx of basal cell carcinoma, hx of squamous cell carcinoma, arthritis, R and L reverse shoulder arthroplasties (R in 2024 and L in 2017) are also affecting patient's functional outcome.   REHAB POTENTIAL: Excellent  CLINICAL DECISION MAKING: Evolving/moderate complexity  EVALUATION COMPLEXITY: Moderate  PLAN:  PT FREQUENCY: 1-2x/week  PT DURATION: 12 weeks  PLANNED INTERVENTIONS: 97164- PT Re-evaluation, 97750- Physical Performance Testing, 97110-Therapeutic exercises, 97530- Therapeutic activity, [id number]- Neuromuscular re-education, 97535- Self Care, 16109- Manual therapy, [id number]- Gait training, [phone]- Orthotic Initial, [phone]- Orthotic/Prosthetic subsequent, [phone]- Canalith repositioning, [phone]- Electrical stimulation (manual), Patient/Family education, Balance training, Stair training, Taping, Dry Needling, Joint mobilization, Joint manipulation, Spinal mobilization, Vestibular training, Visual/preceptual remediation/compensation, DME instructions, Cryotherapy, Moist heat, and Biofeedback  PLAN FOR NEXT SESSION:  - 6 MIN walk test - FGA - Berg Balance Test - initiate HEP     Lania Zawistowski, PT, DPT, NCS, CSRS Physical Therapist - [facility]  8:36 AM 07/31/23

## 2023-08-04 ENCOUNTER — Ambulatory Visit: Admitting: Physical Therapy

## 2023-08-04 DIAGNOSIS — M6281 Muscle weakness (generalized): Secondary | ICD-10-CM

## 2023-08-04 DIAGNOSIS — R208 Other disturbances of skin sensation: Secondary | ICD-10-CM

## 2023-08-04 DIAGNOSIS — I69352 Hemiplegia and hemiparesis following cerebral infarction affecting left dominant side: Secondary | ICD-10-CM | POA: Diagnosis not present

## 2023-08-04 DIAGNOSIS — R2681 Unsteadiness on feet: Secondary | ICD-10-CM

## 2023-08-04 DIAGNOSIS — M5432 Sciatica, left side: Secondary | ICD-10-CM

## 2023-08-04 DIAGNOSIS — R269 Unspecified abnormalities of gait and mobility: Secondary | ICD-10-CM

## 2023-08-04 NOTE — Therapy (Signed)
 OUTPATIENT PHYSICAL THERAPY NEURO TREATMENT   Patient Name: George Bruce MRN: [id number] DOB:[birth year], 78 y.o., male Today's Date: 08/04/2023   PCP: Yehuda Helms, MD  REFERRING PROVIDER: Yehuda Helms, MD   END OF SESSION:   PT End of Session - 08/04/23 1451     Visit Number 2    Number of Visits 24    Date for PT Re-Evaluation 10/22/23    PT Start Time 1450    PT Stop Time 1534    PT Time Calculation (min) 44 min    Equipment Utilized During Treatment Gait belt    Activity Tolerance Patient tolerated treatment well    Behavior During Therapy WFL for tasks assessed/performed              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   Hypertension    Wears dentures    partial upper and lower   Past Surgical History:  Procedure Laterality Date   CATARACT EXTRACTION W/PHACO Right 09/29/2022   Procedure: CATARACT EXTRACTION PHACO AND INTRAOCULAR LENS PLACEMENT (IOC) RIGHT  10.98  00:56.5;  Surgeon: Rosa College, MD;  Location: [facility];  Service: Ophthalmology;  Laterality: Right;   CATARACT EXTRACTION W/PHACO Left 10/13/2022   Procedure: CATARACT EXTRACTION PHACO AND INTRAOCULAR LENS PLACEMENT (IOC) LEFT 10.79 00:54.1;  Surgeon: Rosa College, MD;  Location: [facility];  Service: Ophthalmology;  Laterality: Left;   COLONOSCOPY WITH PROPOFOL  N/A 04/21/2018   Procedure: COLONOSCOPY WITH PROPOFOL ;  Surgeon: Irby Mannan, MD;  Location: [facility];  Service: Endoscopy;  Laterality: N/A;   INGUINAL HERNIA REPAIR Left 1990's   INGUINAL HERNIA REPAIR Right 2015   REVERSE SHOULDER ARTHROPLASTY Left 11/06/2015   Procedure: REVERSE SHOULDER ARTHROPLASTY;  Surgeon: Elner Hahn, MD;  Location: [facility];   Service: Orthopedics;  Laterality: Left;   REVERSE SHOULDER ARTHROPLASTY Right 03/25/2022   Procedure: REVERSE SHOULDER ARTHROPLASTY;  Surgeon: Elner Hahn, MD;  Location: [facility];  Service: Orthopedics;  Laterality: Right;  MAKE 2ND CASE   Patient Active Problem List   Diagnosis Date Noted   Acute CVA (cerebrovascular accident) (HCC) 07/21/2023   Elevated blood pressure reading 07/21/2023   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   Acute diverticulitis 05/07/2018   History of colonic polyps    Benign neoplasm of ascending colon    Diverticulosis of large intestine without diverticulitis    Erectile dysfunction due to arterial insufficiency 02/18/2018   Degenerative disc disease, lumbar 08/26/2016   Bilateral hip pain 06/24/2016   Chronic midline low back pain 06/24/2016   Polyarthralgia 06/24/2016   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   Hematospermia 05/03/2015   Colon polyps 08/16/2013   Hematuria 08/16/2013   Hyperlipidemia 08/16/2013   Incomplete emptying of bladder 06/23/2013   Benign prostatic hyperplasia with incomplete bladder emptying 11/11/2011   Elevated PSA 11/11/2011   Reduced libido 11/11/2011    ONSET DATE: 07/20/2023 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, right PCA occlusion   REFERRING DIAG: I63.50 (ICD-10-CM) - Cerebral infarction due to unspecified occlusion or stenosis of unspecified cerebral artery   THERAPY DIAG:  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 States he went to Bible study at 6:30am and then went to breakfast this morning. Reports he also assisted his friend to do meals on wheels this morning and took 2 meals to individuals   Reports he went to follow-up with MD regarding L LE sciatica, but he couldn't get an injection because he needs a referral. Pt reports he may have to go to [facility] to get the injection. Pt states he took a pain medication, hydrocodone , this morning around 7:00AM and is feeling better now. Pt reports no pain at all right now.    From Initial Eval: Pt states he has hx of sciatica in L LE. Pt states recent CVA affected his L side and he is having numbness in L UE and LE. Pt states the CVA affected him from his jaw down through his L arm and L LE. Pt states he is left hand dominant. Pt states he was walking ~20miles daily and performing all household chores prior to CVA. Patient states he was very active and completely independent prior.   Pt states he had MRI yesterday for L LE sciatica and is going to see MD tomorrow to follow-up on results (with [facility]). Patient reporting current pain as 6/10 with it being greater this morning, but he took pain medication to ease it off prior to coming to therapy.   Pt accompanied by: significant other, Wife, George Bruce  PERTINENT HISTORY: PMH of Hyperlipidemia, BPH, HTN, hx of basal cell carcinoma, hx of squamous cell carcinoma, arthritis, R and L reverse shoulder arthroplasties (R in 2024 and L in 2017)  PAIN:  Are you having pain? Yes: NPRS scale: 6/10 after taking pain medication this AM Pain location: L LE sciatica Pain description: severe ache Aggravating factors: he can wake up with it, no specific aggravating factors Relieving factors: pt reports having to take pain medication (hydrocodone ) this AM, hx of having injections and prednisone ; uses horse liniment   PRECAUTIONS:  Fall  RED FLAGS: None   WEIGHT BEARING RESTRICTIONS: No  FALLS: Has patient fallen in last 6 months? No and but states had a  few stumbles getting his feet tangled up  LIVING ENVIRONMENT: Lives with: lives with their spouse Lives in: House/apartment Stairs: Yes: External: 3+1 steps; on right going up; 1 level house Has following equipment at home: Walker - 2 wheeled and built-in shower seat  PLOF: Independent, Independent with household mobility without device, Independent with homemaking with ambulation, Independent with gait, and Leisure: golf  PATIENT GOALS: "get back to half normal" being able to do yard work, wash a car, and golf  OBJECTIVE:  Note: Objective measures were completed at Evaluation unless otherwise noted.  DIAGNOSTIC FINDINGS:  EXAM: MRI HEAD WITHOUT CONTRAST   TECHNIQUE: Multiplanar, multiecho pulse sequences of the brain and surrounding structures were obtained without intravenous contrast.   COMPARISON:  CT head, CTA, CTP yesterday.  Brain MRI 02/22/2008.  IMPRESSION: 1. Positive for Acute lacunar infarct of the lateral Right Thalamus, but also abnormal diffusion throughout the right hippocampal formation. Both are Right PCA territory, although superimposed seizure related changes of the right hippocampus would be difficult to exclude. No associated hemorrhage or mass effect.   2. 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 No other acute intracranial abnormality identified.   Electronically Signed   By: Marlise Simpers M.D.   On: 07/21/2023 05:51  CTA head:   1. 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 There is some reconstitution of enhancement within right posterior cerebral artery branches beginning at the distal P2 segment level. 2. Severe  stenosis within a left posterior cerebral artery branch at the P2/P3 junction, also new from the prior MRA. 3. Atherosclerotic plaque within the intracranial internal carotid arteries with no more than mild stenosis.     Electronically Signed   By: Bascom Lily D.O.   On: 07/20/2023 19:32   COGNITION: Overall cognitive status: Within functional limits for tasks assessed   SENSATION: Light touch: Impaired  and basically absent Proprioception: Impaired  Can only feel deep pressure in L LE  COORDINATION: Incoordinated in L LE, likely due to sensory impairments  EDEMA:  Not formally assessed  MUSCLE TONE: Not formally assessed  MUSCLE LENGTH: Not formally assessed  DTRs:  Not formally assessed  POSTURE: rounded shoulders and forward head  LOWER EXTREMITY ROM:     Active  Right Eval  WFL/WNL throughout Left Eval  WFL/WNL throughout  Hip flexion    Hip extension    Hip abduction    Hip adduction    Hip internal rotation    Hip external rotation    Knee flexion    Knee extension    Ankle dorsiflexion    Ankle plantarflexion    Ankle inversion    Ankle eversion     (Blank rows = not tested)  LOWER EXTREMITY MMT:    MMT Right Eval Left Eval  Hip flexion 4+ 3+  Hip extension    Hip abduction    Hip adduction    Hip internal rotation    Hip external rotation    Knee flexion 4+ 4-  Knee extension 4+ 4+  Ankle dorsiflexion 4+ 3  Ankle plantarflexion 4+ 4-  Ankle inversion    Ankle eversion    (Blank rows = not tested)  Manual Muscle Test Scale 0/5 = No muscle contraction can be seen or felt 1/5 = Contraction can be felt, but  there is no motion 2-/5 = Part moves through incomplete ROM w/ gravity decreased 2/5 = Part moves through complete ROM w/ gravity decreased 2+/5 = Part moves through incomplete ROM (<50%) against gravity or through complete ROM w/ gravity 3-/5 = Part moves through incomplete ROM (>50%) against gravity 3/5 = Part moves through  complete ROM against gravity 3+/5 = Part moves through complete ROM against gravity/slight resistance 4-/5= Holds test position against slight to moderate pressure 4/5 = Part moves through complete ROM against gravity/moderate resistance 4+/5= Holds test position against moderate to strong pressure 5/5 = Part moves through complete ROM against gravity/full resistance  BED MOBILITY:  Not tested, pt denies difficulty with this  TRANSFERS: Sit to stand: SBA and CGA  Assistive device utilized: Environmental consultant - 2 wheeled and None     Stand to sit: SBA and CGA  Assistive device utilized: Environmental consultant - 2 wheeled and None     Chair to chair: SBA and CGA  Assistive device utilized: Environmental consultant - 2 wheeled and None      *requires CGA when not using AD* RAMP:  Not tested  CURB:  Not tested  STAIRS: Findings: Level of Assistance: CGA and Min A, Stair Negotiation Technique: Step to Pattern Forwards with Single Rail on Right, Number of Stairs: 4, Height of Stairs: 6in   , and Comments: incoordinated, but no greater than min A for balance; only used R HR to simulate home environment GAIT: Findings: Gait Characteristics: step through pattern, decreased arm swing- Right, decreased arm swing- Left, decreased step length- Right, decreased step length- Left, decreased stride length, and wide BOS, Distance walked: 70ft + 366ft, Assistive device utilized:None, Level of assistance: CGA and Min A, and Comments: pt uses RW for all ambulation at home, but didn't use AD during session to assess patient's gait without  FUNCTIONAL TESTS:  5 times sit to stand: 15.12 seconds with arms across chest  6 minute walk test: need to assess 10 meter walk test: 0.93 m/s without AD with CGA for safety  Berg Balance Scale: need to assess Functional gait assessment: need to assess   PATIENT SURVEYS:  Stroke Impact Scale 35/80                                                                                                                               TREATMENT DATE: 07/30/2023   6 Min Walk Test:  Instructed patient to ambulate as quickly and as safely as possible for 6 minutes using LRAD. Patient was allowed to take standing rest breaks without stopping the test, but if the patient required a sitting rest break the clock would be stopped and the test would be over.  Results: 1,027 feet (313 meters, Avg speed 0.870m/s), no AD, with CGA and occasional min A due to catching L foot during swing (because of sensory deficits) causing LOB. Results indicate that the patient has reduced endurance with ambulation compared to age matched norms.  Age Matched Norms: 30-69 yo M: 9 F: 46, 5-79 yo M: 47 F: 471, 83-89 yo M: 417 F: 392 MDC: 58.21 meters (190.98 feet) or 50 meters (ANPTA Core Set of Outcome Measures for Adults with Neurologic Conditions, 2018)   Patient participated in [place] Balance Test and demonstrates increased fall risk as noted by score of  38/56. Pt demos greatest deficits with reaching forward outside BOS, transferring without use of UE support, alternating foot taps to step, 360deg turn, and standing on 1 leg.  (<36= high risk for falls, close to 100%; 37-45 significant >80%; 46-51 moderate >50%; 52-55 lower >25%).  Pt participated in Functional Gait Assessment (FGA) with score of 5/30 demonstrating high fall risk (low fall risk 25-28, medium fall risk 19-24, and high fall risk <19). Pt demonstrates increased path deviation with several items as well as intermittent LOB requiring light min A to recover. Details below.    [facility] PT Assessment - 08/04/23 0001       Standardized Balance Assessment   Standardized Balance Assessment Berg Balance Test      Berg Balance Test   Sit to Stand Able to stand  independently using hands    Standing Unsupported Able to stand safely 2 minutes   throughout standing items, pt keeps slightly wider BOS   Sitting with Back Unsupported but Feet Supported on Floor or Stool Able to sit safely  and securely 2 minutes    Stand to Sit Sits safely with minimal use of hands    Transfers Able to transfer with verbal cueing and /or supervision    Standing Unsupported with Eyes Closed Able to stand 10 seconds with supervision    Standing Unsupported with Feet Together Able to place feet together independently and stand for 1 minute with supervision    From Standing, Reach Forward with Outstretched Arm Reaches forward but needs supervision    From Standing Position, Pick up Object from Floor Able to pick up shoe, needs supervision    From Standing Position, Turn to Look Behind Over each Shoulder Looks behind from both sides and weight shifts well    Turn 360 Degrees Able to turn 360 degrees safely but slowly    Standing Unsupported, Alternately Place Feet on Step/Stool Able to complete 4 steps without aid or supervision    Standing Unsupported, One Foot in Front Able to plae foot ahead of the other independently and hold 30 seconds    Standing on One Leg Unable to try or needs assist to prevent fall   has LOB at end requiring min A for balance   Total Score 38      Functional Gait  Assessment   Gait assessed  Yes    Gait Level Surface Walks 20 ft, slow speed, abnormal gait pattern, evidence for imbalance or deviates 10-15 in outside of the 12 in walkway width. Requires more than 7 sec to ambulate 20 ft.   slow speed and minor path deviation   Change in Gait Speed Cannot change speeds, deviates greater than 15 in outside 12 in walkway width, or loses balance and has to reach for wall or be caught.   requires A to maintain balance   Gait with Horizontal Head Turns Performs head turns with moderate changes in gait velocity, slows down, deviates 10-15 in outside 12 in walkway width but recovers, can continue to walk.   deviates significantly outside walkway   Gait with Vertical Head Turns Performs task with severe disruption of gait (eg,  staggers 15 in outside 12 in walkway width, loses balance,  stops, reaches for wall).   not as much path deviation as with horizontal head turns but has anterior LOB due to tripping over L toes at end   Gait and Pivot Turn Turns slowly, requires verbal cueing, or requires several small steps to catch balance following turn and stop    Step Over Obstacle Cannot perform without assistance.    Gait with Narrow Base of Support Ambulates less than 4 steps heel to toe or cannot perform without assistance.    Gait with Eyes Closed Cannot walk 20 ft without assistance, severe gait deviations or imbalance, deviates greater than 15 in outside 12 in walkway width or will not attempt task.    Ambulating Backwards Walks 20 ft, slow speed, abnormal gait pattern, evidence for imbalance, deviates 10-15 in outside 12 in walkway width.    Steps Alternating feet, must use rail.    Total Score 5             Educated pt on results of assessment today and plan to prescribe HEP at next visit. Encouraged pt to continue staying active via ambulating daily with use of RW and performing sit<>stand exercises.  PATIENT EDUCATION: Education details: Importance of performing L side skin assessments due to sensory deficits, checking hot/cold using R hand due to sensory deficits, findings of assessment today, exercises to perform at home until formal HEP developed Person educated: Patient and Spouse Education method: Explanation Education comprehension: verbalized understanding and needs further education  HOME EXERCISE PROGRAM: Need to provide formal HEP - educated on walking daily using RW and sit<>stands for exercise  GOALS: Goals reviewed with patient? Yes  SHORT TERM GOALS: Target date: 09/10/2023  Patient will be independent in home exercise program to improve strength/mobility for better functional independence with ADLs and functional mobility.  Baseline: need to initiate Goal status: INITIAL   LONG TERM GOALS: Target date: 10/22/2023  Patient (> 22 years old) will  complete five times sit to stand test (5XSTS) in < 12 seconds indicating an increased LE strength and improved balance.  Baseline: 15.12 seconds with arms across chest Goal status: INITIAL  2.  Patient will increase Berg Balance score to > 51/56 to demonstrate improved balance and decreased fall risk during functional activities and ADLs.  Baseline: 38/56 Goal status: INITIAL  3.  Patient will increase six minute walk test ( ) distance to >1042ft for progression to community ambulator and improve gait ability  Baseline: 1,027 feet (313 meters, Avg speed 0.812m/s), no AD, with CGA and occasional min A Goal status: INITIAL  4.  Patient will increase 10 meter walk test to >1.4m/s as to improve gait speed for better community ambulation and to reduce fall risk. Baseline: 0.93 m/s without AD with CGA  Goal status: INITIAL  5.  Patient will increase Functional Gait Assessment (FGA) score to >20/30 as to reduce fall risk and improve dynamic gait safety with community ambulation.  Baseline: 5/30 Goal status: INITIAL  6.  Patient will improve Stroke Impact Scale-16 by >9 points indicating patient experiencing increased independence with functional mobility tasks and ADLs to improve quality of life and decrease caregiver burden.  Baseline: 35/80 Goal status: INITIAL  ASSESSMENT:  CLINICAL IMPRESSION: Patient is an 78 y.o. male who was seen today for physical therapy treatment for L hemiparesis and L hemisensory impairments following CVA as well as treatment to address pain from L LE sciatica impacting his recovery post-CVA. Therapy session  focused on further assessment of patients gait endurance, standing balance, and balance during dynamic gait. Patient demonstrates decreased gait endurance compared to aged matched norms as noted on walk test. He also demonstrates high fall risk as noted on Berg Balance Test and FGA as described above. Pt continues to have L hemisensory deficits resulting  in incoordination and imbalance, especially in L LE with pt noted to intermittently catch his foot during gait causing minor LOB and requiring light min A to recover. Patient's gait mechanics and balance further declined with any additional dynamic challenge as noted on FGA.  Mr. Ackerley will benefit from further skilled PT to improve these deficits in order to increase QOL, decrease fall risk, and ease/safety with ADLs.   OBJECTIVE IMPAIRMENTS: Abnormal gait, decreased activity tolerance, decreased balance, decreased coordination, decreased endurance, decreased knowledge of condition, decreased knowledge of use of DME, decreased mobility, difficulty walking, decreased strength, increased fascial restrictions, impaired flexibility, impaired sensation, impaired UE functional use, improper body mechanics, and pain.   ACTIVITY LIMITATIONS: carrying, lifting, bending, sitting, standing, squatting, stairs, transfers, bathing, toileting, dressing, reach over head, hygiene/grooming, and locomotion level  PARTICIPATION LIMITATIONS: meal prep, cleaning, laundry, interpersonal relationship, driving, shopping, community activity, and yard work  PERSONAL FACTORS: Age, Fitness, Time since onset of injury/illness/exacerbation, and 3+ comorbidities: Hyperlipidemia, BPH, HTN, hx of basal cell carcinoma, hx of squamous cell carcinoma, arthritis, R and L reverse shoulder arthroplasties (R in 2024 and L in 2017) are also affecting patient's functional outcome.   REHAB POTENTIAL: Excellent  CLINICAL DECISION MAKING: Evolving/moderate complexity  EVALUATION COMPLEXITY: Moderate  PLAN:  PT FREQUENCY: 1-2x/week  PT DURATION: 12 weeks  PLANNED INTERVENTIONS: 97164- PT Re-evaluation, 97750- Physical Performance Testing, 97110-Therapeutic exercises, 97530- Therapeutic activity, [id number]- Neuromuscular re-education, 97535- Self Care, 16109- Manual therapy, [id number]- Gait training, [phone]- Orthotic Initial, [phone]-  Orthotic/Prosthetic subsequent, [phone]- Canalith repositioning, [phone]- Electrical stimulation (manual), Patient/Family education, Balance training, Stair training, Taping, Dry Needling, Joint mobilization, Joint manipulation, Spinal mobilization, Vestibular training, Visual/preceptual remediation/compensation, DME instructions, Cryotherapy, Moist heat, and Biofeedback  PLAN FOR NEXT SESSION:  - initiate HEP  - walking  - sit<>stands   - standing marches?  - side stepping at counter  - stretches for L LE sciatica? - participate in higher level dynamic balance and gait challenges including:  - horizontal and vertical head turns  - stepping towards external targets due to LLE incoordination  - gait in variable directions      Ulric Salzman, PT, DPT, NCS, CSRS Physical Therapist - [place]  [facility]  3:57 PM 08/04/23

## 2023-08-06 ENCOUNTER — Ambulatory Visit: Admitting: Physical Therapy

## 2023-08-06 DIAGNOSIS — R208 Other disturbances of skin sensation: Secondary | ICD-10-CM

## 2023-08-06 DIAGNOSIS — R2681 Unsteadiness on feet: Secondary | ICD-10-CM

## 2023-08-06 DIAGNOSIS — R269 Unspecified abnormalities of gait and mobility: Secondary | ICD-10-CM

## 2023-08-06 DIAGNOSIS — I69352 Hemiplegia and hemiparesis following cerebral infarction affecting left dominant side: Secondary | ICD-10-CM | POA: Diagnosis not present

## 2023-08-06 NOTE — Therapy (Signed)
 OUTPATIENT PHYSICAL THERAPY NEURO TREATMENT   Patient Name: George Bruce MRN: [id number] DOB:[birth year], 78 y.o., male Today's Date: 08/06/2023   PCP: Yehuda Helms, MD  REFERRING PROVIDER: Yehuda Helms, MD   END OF SESSION:   PT End of Session - 08/06/23 1321     Visit Number 3    Number of Visits 24    Date for PT Re-Evaluation 10/22/23    Progress Note Due on Visit 10    PT Start Time 1319    PT Stop Time 1358    PT Time Calculation (min) 39 min    Equipment Utilized During Treatment Gait belt    Activity Tolerance Patient tolerated treatment well    Behavior During Therapy WFL for tasks assessed/performed               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   Hypertension    Wears dentures    partial upper and lower   Past Surgical History:  Procedure Laterality Date   CATARACT EXTRACTION W/PHACO Right 09/29/2022   Procedure: CATARACT EXTRACTION PHACO AND INTRAOCULAR LENS PLACEMENT (IOC) RIGHT  10.98  00:56.5;  Surgeon: Rosa College, MD;  Location: [facility];  Service: Ophthalmology;  Laterality: Right;   CATARACT EXTRACTION W/PHACO Left 10/13/2022   Procedure: CATARACT EXTRACTION PHACO AND INTRAOCULAR LENS PLACEMENT (IOC) LEFT 10.79 00:54.1;  Surgeon: Rosa College, MD;  Location: [facility];  Service: Ophthalmology;  Laterality: Left;   COLONOSCOPY WITH PROPOFOL  N/A 04/21/2018   Procedure: COLONOSCOPY WITH PROPOFOL ;  Surgeon: Irby Mannan, MD;  Location: [facility];  Service: Endoscopy;  Laterality: N/A;   INGUINAL HERNIA REPAIR Left 1990's   INGUINAL HERNIA REPAIR Right 2015   REVERSE SHOULDER ARTHROPLASTY Left 11/06/2015   Procedure: REVERSE SHOULDER ARTHROPLASTY;  Surgeon: Elner Hahn, MD;  Location: [facility];  Service: Orthopedics;  Laterality: Left;   REVERSE SHOULDER ARTHROPLASTY Right 03/25/2022   Procedure: REVERSE SHOULDER ARTHROPLASTY;  Surgeon: Elner Hahn, MD;  Location: [facility];  Service: Orthopedics;  Laterality: Right;  MAKE 2ND CASE   Patient Active Problem List   Diagnosis Date Noted   Acute CVA (cerebrovascular accident) (HCC) 07/21/2023   Elevated blood pressure reading 07/21/2023   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   Acute diverticulitis 05/07/2018   History of colonic polyps    Benign neoplasm of ascending colon    Diverticulosis of large intestine without diverticulitis    Erectile dysfunction due to arterial insufficiency 02/18/2018   Degenerative disc disease, lumbar 08/26/2016   Bilateral hip pain 06/24/2016   Chronic midline low back pain 06/24/2016   Polyarthralgia 06/24/2016   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   Hematospermia 05/03/2015   Colon polyps 08/16/2013   Hematuria 08/16/2013   Hyperlipidemia 08/16/2013   Incomplete emptying of bladder 06/23/2013   Benign prostatic hyperplasia with incomplete bladder emptying 11/11/2011   Elevated PSA 11/11/2011   Reduced libido 11/11/2011    ONSET DATE: 07/20/2023 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 Got a successful referral for a injection but is awaiting scheduling.   From Initial Eval: Pt states he has hx of sciatica in L LE. Pt states recent CVA affected his L side and he is having numbness in L UE and LE. Pt states the CVA affected him from his jaw down through his L arm and L LE. Pt states he is left hand dominant. Pt states he was walking ~81miles daily and performing all household chores prior to CVA. Patient states he was very active and completely independent prior.   Pt states he had MRI yesterday for L LE sciatica and is going to see MD tomorrow to follow-up on results (with [facility]). Patient reporting current pain as 6/10 with it being greater this morning, but he took pain medication to ease it off prior to coming to therapy.   Pt accompanied by: significant other, Wife, [name]  PERTINENT HISTORY: PMH of Hyperlipidemia, BPH, HTN, hx of basal cell carcinoma, hx of squamous cell carcinoma, arthritis, R and L reverse shoulder arthroplasties (R in 2024 and L in 2017)  PAIN:  Are you having pain? Yes: NPRS scale: 6/10 after taking pain medication this AM Pain location: L LE sciatica Pain description: severe ache Aggravating factors: he can wake up with it, no specific aggravating factors Relieving factors: pt reports having to take pain medication (hydrocodone ) this AM, hx of having injections and prednisone ; uses horse liniment   PRECAUTIONS: Fall  RED FLAGS: None   WEIGHT BEARING RESTRICTIONS: No  FALLS: Has patient fallen in last 6 months? No and but states had a few stumbles getting his feet tangled up  LIVING ENVIRONMENT: Lives with: lives with their spouse Lives in: House/apartment Stairs: Yes: External: 3+1 steps; on right going up; 1 level house Has following equipment at home: Walker - 2 wheeled and built-in shower seat  PLOF: Independent, Independent with household mobility without device, Independent with homemaking with ambulation, Independent with  gait, and Leisure: golf  PATIENT GOALS: "get back to half normal" being able to do yard work, wash a car, and golf  OBJECTIVE:  Note: Objective measures were completed at  Evaluation unless otherwise noted.  DIAGNOSTIC FINDINGS:  EXAM: MRI HEAD WITHOUT CONTRAST   TECHNIQUE: Multiplanar, multiecho pulse sequences of the brain and surrounding structures were obtained without intravenous contrast.   COMPARISON:  CT head, CTA, CTP yesterday.  Brain MRI 02/22/2008.  IMPRESSION: 1. Positive for Acute lacunar infarct of the lateral Right Thalamus, but also abnormal diffusion throughout the right hippocampal formation. Both are Right PCA territory, although superimposed seizure related changes of the right hippocampus would be difficult to exclude. No associated hemorrhage or mass effect.   2. 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 No other acute intracranial abnormality identified.   Electronically Signed   By: Marlise Simpers M.D.   On: 07/21/2023 05:51  CTA head:   1. 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 There is some reconstitution of enhancement within right posterior cerebral artery branches beginning at the distal P2 segment level. 2. 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 3. Atherosclerotic plaque within the intracranial internal carotid arteries with no more than mild stenosis.     Electronically Signed   By: Bascom Lily D.O.   On: 07/20/2023 19:32   COGNITION: Overall cognitive status: Within functional limits for tasks assessed   SENSATION: Light touch: Impaired  and basically absent Proprioception: Impaired  Can only feel deep pressure in L LE  COORDINATION: Incoordinated  in L LE, likely due to sensory impairments  EDEMA:  Not formally assessed  MUSCLE TONE: Not formally assessed  MUSCLE LENGTH: Not formally assessed  DTRs:  Not formally assessed  POSTURE: rounded shoulders and forward head  LOWER EXTREMITY ROM:     Active  Right Eval  WFL/WNL throughout Left Eval  WFL/WNL throughout  Hip flexion    Hip extension    Hip abduction    Hip adduction    Hip internal rotation    Hip external rotation    Knee flexion    Knee extension    Ankle dorsiflexion    Ankle plantarflexion    Ankle inversion    Ankle eversion     (Blank rows = not tested)  LOWER EXTREMITY MMT:    MMT Right Eval Left Eval  Hip flexion 4+ 3+  Hip extension    Hip abduction    Hip adduction    Hip internal rotation    Hip external rotation    Knee flexion 4+ 4-  Knee extension 4+ 4+  Ankle dorsiflexion 4+ 3  Ankle plantarflexion 4+ 4-  Ankle inversion    Ankle eversion    (Blank rows = not tested)  Manual Muscle Test Scale 0/5 = No muscle contraction can be seen or felt 1/5 = Contraction can be felt, but there is no motion 2-/5 = Part moves through incomplete ROM w/ gravity decreased 2/5 = Part moves through complete ROM w/ gravity decreased 2+/5 = Part moves through incomplete ROM (<50%) against gravity or through complete ROM w/ gravity 3-/5 = Part moves through incomplete ROM (>50%) against gravity 3/5 = Part moves through complete ROM against gravity 3+/5 = Part moves through complete ROM against gravity/slight resistance 4-/5= Holds test position against slight to moderate pressure 4/5 = Part moves through complete ROM against gravity/moderate resistance 4+/5= Holds test position  against moderate to strong pressure 5/5 = Part moves through complete ROM against gravity/full resistance  BED MOBILITY:  Not tested, pt denies difficulty with this  TRANSFERS: Sit to stand: SBA and CGA  Assistive device utilized: Environmental consultant - 2 wheeled and None      Stand to sit: SBA and CGA  Assistive device utilized: Environmental consultant - 2 wheeled and None     Chair to chair: SBA and CGA  Assistive device utilized: Environmental consultant - 2 wheeled and None      *requires CGA when not using AD* RAMP:  Not tested  CURB:  Not tested  STAIRS: Findings: Level of Assistance: CGA and Min A, Stair Negotiation Technique: Step to Pattern Forwards with Single Rail on Right, Number of Stairs: 4, Height of Stairs: 6in   , and Comments: incoordinated, but no greater than min A for balance; only used R HR to simulate home environment GAIT: Findings: Gait Characteristics: step through pattern, decreased arm swing- Right, decreased arm swing- Left, decreased step length- Right, decreased step length- Left, decreased stride length, and wide BOS, Distance walked: 14ft + 329ft, Assistive device utilized:None, Level of assistance: CGA and Min A, and Comments: pt uses RW for all ambulation at home, but didn't use AD during session to assess patient's gait without  FUNCTIONAL TESTS:  5 times sit to stand: 15.12 seconds with arms across chest  6 minute walk test: need to assess 10 meter walk test: 0.93 m/s without AD with CGA for safety  Berg Balance Scale: need to assess Functional gait assessment: need to assess   PATIENT SURVEYS:  Stroke Impact Scale 35/80                                                                                                                              TREATMENT DATE: 07/30/2023  TE- To improve strength, endurance, mobility, and function of specific targeted muscle groups or improve joint range of motion or improve muscle flexibility  Standing marching 2 x 10   STS 2 x 10   Sidestepping 2 x 12   Above was HEP  NMR: To facilitate reeducation of movement, balance, posture, coordination, and/or proprioception/kinesthetic sense.   Airex stance normal stance x 30 sec   Airex adducted stance 3 x 30 sec   Box stepping ( forward, side, and retro stepping) x  3 rounds about 15 ft between   TE- To improve strength, endurance, mobility, and function of specific targeted muscle groups or improve joint range of motion or improve muscle flexibility  LAQ x 10 ea LE with 4# AW   Pt required occasional rest breaks due worsening of radicular symptoms with prolonged standing and general increased pain noted throughout session.    PATIENT EDUCATION: Education details: Importance of performing L side skin assessments due to sensory deficits, checking hot/cold using R hand due to sensory deficits, findings of assessment today, exercises to perform at home until formal HEP developed Person  educated: Patient and Spouse Education method: Explanation Education comprehension: verbalized understanding and needs further education  HOME EXERCISE PROGRAM:  Access Code: [email or web address] URL: [email or web address] Date: 08/06/2023 Prepared by: Marlynn Singer  Exercises - Standing March with Unilateral Counter Support  - 1 x daily - 7 x weekly - 2 sets - 10 reps - Sit to Stand Without Arm Support  - 1 x daily - 7 x weekly - 2 sets - 10 reps - Side Stepping with Counter Support  - 1 x daily - 7 x weekly - 3 sets - 10 reps  GOALS: Goals reviewed with patient? Yes  SHORT TERM GOALS: Target date: 09/10/2023  Patient will be independent in home exercise program to improve strength/mobility for better functional independence with ADLs and functional mobility.  Baseline: need to initiate Goal status: INITIAL   LONG TERM GOALS: Target date: 10/22/2023  Patient (> 9 years old) will complete five times sit to stand test (5XSTS) in < 12 seconds indicating an increased LE strength and improved balance.  Baseline: 15.12 seconds with arms across chest Goal status: INITIAL  2.  Patient will increase Berg Balance score to > 51/56 to demonstrate improved balance and decreased fall risk during functional activities and ADLs.  Baseline: 38/56 Goal status:  INITIAL  3.  Patient will increase six minute walk test ( ) distance to >1089ft for progression to community ambulator and improve gait ability  Baseline: 1,027 feet (313 meters, Avg speed 0.858m/s), no AD, with CGA and occasional min A Goal status: INITIAL  4.  Patient will increase 10 meter walk test to >1.70m/s as to improve gait speed for better community ambulation and to reduce fall risk. Baseline: 0.93 m/s without AD with CGA  Goal status: INITIAL  5.  Patient will increase Functional Gait Assessment (FGA) score to >20/30 as to reduce fall risk and improve dynamic gait safety with community ambulation.  Baseline: 5/30 Goal status: INITIAL  6.  Patient will improve Stroke Impact Scale-16 by >9 points indicating patient experiencing increased independence with functional mobility tasks and ADLs to improve quality of life and decrease caregiver burden.  Baseline: 35/80 Goal status: INITIAL  ASSESSMENT:  CLINICAL IMPRESSION:  Patient arrived with good motivation for completion of pt activities. Pt required occasional rest breaks due worsening of radicular symptoms with prolonged standing and general increased pain noted throughout session. Pt introduced to initial HEP working on general strength and coordination. Pt will continue to benefit from skilled physical therapy intervention to address impairments, improve QOL, and attain therapy goals.    OBJECTIVE IMPAIRMENTS: Abnormal gait, decreased activity tolerance, decreased balance, decreased coordination, decreased endurance, decreased knowledge of condition, decreased knowledge of use of DME, decreased mobility, difficulty walking, decreased strength, increased fascial restrictions, impaired flexibility, impaired sensation, impaired UE functional use, improper body mechanics, and pain.   ACTIVITY LIMITATIONS: carrying, lifting, bending, sitting, standing, squatting, stairs, transfers, bathing, toileting, dressing, reach over head,  hygiene/grooming, and locomotion level  PARTICIPATION LIMITATIONS: meal prep, cleaning, laundry, interpersonal relationship, driving, shopping, community activity, and yard work  PERSONAL FACTORS: Age, Fitness, Time since onset of injury/illness/exacerbation, and 3+ comorbidities: Hyperlipidemia, BPH, HTN, hx of basal cell carcinoma, hx of squamous cell carcinoma, arthritis, R and L reverse shoulder arthroplasties (R in 2024 and L in 2017) are also affecting patient's functional outcome.   REHAB POTENTIAL: Excellent  CLINICAL DECISION MAKING: Evolving/moderate complexity  EVALUATION COMPLEXITY: Moderate  PLAN:  PT FREQUENCY: 1-2x/week  PT DURATION: 12 weeks  PLANNED INTERVENTIONS: 16109- PT Re-evaluation,  16109- Physical Performance Testing, 97110-Therapeutic exercises, 97530- Therapeutic activity, [id number]- Neuromuscular re-education, [phone]- Self Care, 09811- Manual therapy, [phone]- Gait training, [phone]- Orthotic Initial, [phone]- Orthotic/Prosthetic subsequent, [phone]- Canalith repositioning, [phone]- Electrical stimulation (manual), Patient/Family education, Balance training, Stair training, Taping, Dry Needling, Joint mobilization, Joint manipulation, Spinal mobilization, Vestibular training, Visual/preceptual remediation/compensation, DME instructions, Cryotherapy, Moist heat, and Biofeedback  PLAN FOR NEXT SESSION:  - initiate HEP  - walking  - sit<>stands  - standing marches  - side stepping at counter  - stretches for L LE sciatica - participate in higher level dynamic balance and gait challenges including:  - horizontal and vertical head turns  - stepping towards external targets due to LLE incoordination  - gait in variable directions   Note: Portions of this document were prepared using Dragon voice recognition software and although reviewed may contain unintentional dictation errors in syntax, grammar, or spelling.  Edwina Gram PT ,DPT Physical Therapist- [place]   [facility]   3:47 PM 08/06/23

## 2023-08-10 ENCOUNTER — Ambulatory Visit: Attending: Internal Medicine | Admitting: Physical Therapy

## 2023-08-10 DIAGNOSIS — M6281 Muscle weakness (generalized): Secondary | ICD-10-CM | POA: Insufficient documentation

## 2023-08-10 DIAGNOSIS — M5432 Sciatica, left side: Secondary | ICD-10-CM | POA: Insufficient documentation

## 2023-08-10 DIAGNOSIS — R2681 Unsteadiness on feet: Secondary | ICD-10-CM | POA: Insufficient documentation

## 2023-08-10 DIAGNOSIS — R208 Other disturbances of skin sensation: Secondary | ICD-10-CM | POA: Diagnosis present

## 2023-08-10 DIAGNOSIS — I69352 Hemiplegia and hemiparesis following cerebral infarction affecting left dominant side: Secondary | ICD-10-CM | POA: Insufficient documentation

## 2023-08-10 DIAGNOSIS — R269 Unspecified abnormalities of gait and mobility: Secondary | ICD-10-CM | POA: Insufficient documentation

## 2023-08-10 DIAGNOSIS — R262 Difficulty in walking, not elsewhere classified: Secondary | ICD-10-CM | POA: Diagnosis present

## 2023-08-10 DIAGNOSIS — R278 Other lack of coordination: Secondary | ICD-10-CM | POA: Diagnosis present

## 2023-08-10 NOTE — Therapy (Signed)
 OUTPATIENT PHYSICAL THERAPY NEURO TREATMENT   Patient Name: George Bruce MRN: [id number] DOB:[birth year], 78 y.o., male Today's Date: 08/11/2023   PCP: Yehuda Helms, MD  REFERRING PROVIDER: Yehuda Helms, MD   END OF SESSION:   PT End of Session - 08/11/23 1027     Visit Number 4    Number of Visits 24    Date for PT Re-Evaluation 10/22/23    Progress Note Due on Visit 10    PT Start Time 1317    PT Stop Time 1358    PT Time Calculation (min) 41 min    Equipment Utilized During Treatment Gait belt    Activity Tolerance Patient tolerated treatment well    Behavior During Therapy WFL for tasks assessed/performed                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   Hypertension    Wears dentures    partial upper and lower   Past Surgical History:  Procedure Laterality Date   CATARACT EXTRACTION W/PHACO Right 09/29/2022   Procedure: CATARACT EXTRACTION PHACO AND INTRAOCULAR LENS PLACEMENT (IOC) RIGHT  10.98  00:56.5;  Surgeon: Rosa College, MD;  Location: [facility];  Service: Ophthalmology;  Laterality: Right;   CATARACT EXTRACTION W/PHACO Left 10/13/2022   Procedure: CATARACT EXTRACTION PHACO AND INTRAOCULAR LENS PLACEMENT (IOC) LEFT 10.79 00:54.1;  Surgeon: Rosa College, MD;  Location: [facility];  Service: Ophthalmology;  Laterality: Left;   COLONOSCOPY WITH PROPOFOL  N/A 04/21/2018   Procedure: COLONOSCOPY WITH PROPOFOL ;  Surgeon: Irby Mannan, MD;  Location: [facility];  Service: Endoscopy;  Laterality: N/A;   INGUINAL HERNIA REPAIR Left 1990's   INGUINAL HERNIA REPAIR Right 2015   REVERSE SHOULDER ARTHROPLASTY Left 11/06/2015   Procedure: REVERSE SHOULDER ARTHROPLASTY;  Surgeon: Elner Hahn, MD;  Location: [facility];  Service: Orthopedics;  Laterality: Left;   REVERSE SHOULDER ARTHROPLASTY Right 03/25/2022   Procedure: REVERSE SHOULDER ARTHROPLASTY;  Surgeon: Elner Hahn, MD;  Location: [facility];  Service: Orthopedics;  Laterality: Right;  MAKE 2ND CASE   Patient Active Problem List   Diagnosis Date Noted   Acute CVA (cerebrovascular accident) (HCC) 07/21/2023   Elevated blood pressure reading 07/21/2023   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   Acute diverticulitis 05/07/2018   History of colonic polyps    Benign neoplasm of ascending colon    Diverticulosis of large intestine without diverticulitis    Erectile dysfunction due to arterial insufficiency 02/18/2018   Degenerative disc disease, lumbar 08/26/2016   Bilateral hip pain 06/24/2016   Chronic midline low back pain 06/24/2016   Polyarthralgia 06/24/2016   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   Hematospermia 05/03/2015   Colon polyps 08/16/2013   Hematuria 08/16/2013   Hyperlipidemia 08/16/2013   Incomplete emptying of bladder 06/23/2013   Benign prostatic hyperplasia with incomplete bladder emptying 11/11/2011   Elevated PSA 11/11/2011   Reduced libido 11/11/2011    ONSET DATE: 07/20/2023 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, right PCA occlusion   REFERRING DIAG: I63.50 (ICD-10-CM) - Cerebral infarction due to unspecified occlusion or  stenosis of unspecified cerebral artery   THERAPY DIAG:  Abnormality of gait  Other disturbances of skin sensation  Unsteadiness on feet  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   From Initial Eval: Pt states he has hx of sciatica in L LE. Pt states recent CVA affected his L side and he is having numbness in L UE and LE. Pt states the CVA affected him from his jaw down through his L arm and L LE. Pt states he is left hand dominant. Pt states he was walking ~41miles daily and performing all household chores prior to CVA. Patient states he was very active and completely independent prior.   Pt states he had MRI yesterday for L LE sciatica and is going to see MD tomorrow to follow-up on results (with [facility]). Patient reporting current pain as 6/10 with it being greater this morning, but he took pain medication to ease it off prior to coming to therapy.   Pt accompanied by: significant other, Wife, [name]  PERTINENT HISTORY: PMH of Hyperlipidemia, BPH, HTN, hx of basal cell carcinoma, hx of squamous cell carcinoma, arthritis, R and L reverse shoulder arthroplasties (R in 2024 and L in 2017)  PAIN:  Are you having pain? Yes: NPRS scale: 6/10 after taking pain medication this AM Pain location: L LE sciatica Pain description: severe ache Aggravating factors: he can wake up with it, no specific aggravating factors Relieving factors: pt reports having to take pain medication (hydrocodone ) this AM, hx of having injections and prednisone ; uses horse liniment   PRECAUTIONS: Fall  RED FLAGS: None   WEIGHT BEARING RESTRICTIONS: No  FALLS: Has patient fallen in last 6 months? No and but states had a few stumbles getting his feet tangled up  LIVING ENVIRONMENT: Lives with: lives with their spouse Lives in: House/apartment Stairs: Yes: External: 3+1 steps; on right going up; 1 level house Has following equipment at home: Walker - 2 wheeled and built-in shower seat  PLOF:  Independent, Independent with household mobility without device, Independent with homemaking with ambulation, Independent with gait, and Leisure: golf  PATIENT GOALS: "get back to  half normal" being able to do yard work, wash a car, and golf  OBJECTIVE:  Note: Objective measures were completed at Evaluation unless otherwise noted.  DIAGNOSTIC FINDINGS:  EXAM: MRI HEAD WITHOUT CONTRAST   TECHNIQUE: Multiplanar, multiecho pulse sequences of the brain and surrounding structures were obtained without intravenous contrast.   COMPARISON:  CT head, CTA, CTP yesterday.  Brain MRI 02/22/2008.  IMPRESSION: 1. Positive for Acute lacunar infarct of the lateral Right Thalamus, but also abnormal diffusion throughout the right hippocampal formation. Both are Right PCA territory, although superimposed seizure related changes of the right hippocampus would be difficult to exclude. No associated hemorrhage or mass effect.   2. 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 No other acute intracranial abnormality identified.   Electronically Signed   By: Marlise Simpers M.D.   On: 07/21/2023 05:51  CTA head:   1. 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 There is some reconstitution of enhancement within right posterior cerebral artery branches beginning at the distal P2 segment level. 2. 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 3. Atherosclerotic plaque within the intracranial internal carotid arteries with no more than mild stenosis.     Electronically Signed   By: Bascom Lily D.O.   On: 07/20/2023 19:32   COGNITION: Overall cognitive status: Within functional limits for tasks assessed   SENSATION: Light touch:  Impaired  and basically absent Proprioception: Impaired  Can only feel deep pressure in L LE  COORDINATION: Incoordinated in L LE, likely due to sensory impairments  EDEMA:  Not formally assessed  MUSCLE TONE: Not formally assessed  MUSCLE LENGTH: Not formally assessed  DTRs:  Not formally assessed  POSTURE: rounded shoulders and forward head  LOWER EXTREMITY ROM:     Active  Right Eval  WFL/WNL throughout Left Eval  WFL/WNL throughout  Hip flexion    Hip extension    Hip abduction    Hip adduction    Hip internal rotation    Hip external rotation    Knee flexion    Knee extension    Ankle dorsiflexion    Ankle plantarflexion    Ankle inversion    Ankle eversion     (Blank rows = not tested)  LOWER EXTREMITY MMT:    MMT Right Eval Left Eval  Hip flexion 4+ 3+  Hip extension    Hip abduction    Hip adduction    Hip internal rotation    Hip external rotation    Knee flexion 4+ 4-  Knee extension 4+ 4+  Ankle dorsiflexion 4+ 3  Ankle plantarflexion 4+ 4-  Ankle inversion    Ankle eversion    (Blank rows = not tested)  Manual Muscle Test Scale 0/5 = No muscle contraction can be seen or felt 1/5 = Contraction can be felt, but there is no motion 2-/5 = Part moves through incomplete ROM w/ gravity decreased 2/5 = Part moves through complete ROM w/ gravity decreased 2+/5 = Part moves through incomplete ROM (<50%) against gravity or through complete ROM w/ gravity 3-/5 = Part moves through incomplete ROM (>50%) against gravity 3/5 = Part moves through complete ROM against gravity 3+/5 = Part moves through complete ROM against gravity/slight resistance 4-/5=  Holds test position against slight to moderate pressure 4/5 = Part moves through complete ROM against gravity/moderate resistance 4+/5= Holds test position against moderate to strong pressure 5/5 = Part moves through complete ROM against gravity/full resistance  BED MOBILITY:  Not tested, pt  denies difficulty with this  TRANSFERS: Sit to stand: SBA and CGA  Assistive device utilized: Environmental consultant - 2 wheeled and None     Stand to sit: SBA and CGA  Assistive device utilized: Environmental consultant - 2 wheeled and None     Chair to chair: SBA and CGA  Assistive device utilized: Environmental consultant - 2 wheeled and None      *requires CGA when not using AD* RAMP:  Not tested  CURB:  Not tested  STAIRS: Findings: Level of Assistance: CGA and Min A, Stair Negotiation Technique: Step to Pattern Forwards with Single Rail on Right, Number of Stairs: 4, Height of Stairs: 6in   , and Comments: incoordinated, but no greater than min A for balance; only used R HR to simulate home environment GAIT: Findings: Gait Characteristics: step through pattern, decreased arm swing- Right, decreased arm swing- Left, decreased step length- Right, decreased step length- Left, decreased stride length, and wide BOS, Distance walked: 49ft + 312ft, Assistive device utilized:None, Level of assistance: CGA and Min A, and Comments: pt uses RW for all ambulation at home, but didn't use AD during session to assess patient's gait without  FUNCTIONAL TESTS:  5 times sit to stand: 15.12 seconds with arms across chest  6 minute walk test: need to assess 10 meter walk test: 0.93 m/s without AD with CGA for safety  Berg Balance Scale: need to assess Functional gait assessment: need to assess   PATIENT SURVEYS:  Stroke Impact Scale 35/80                                                                                                                              TREATMENT DATE: 07/30/2023  TE- To improve strength, endurance, mobility, and function of specific targeted muscle groups or improve joint range of motion or improve muscle flexibility alternating with TA- To improve functional movements patterns for everyday tasks   Resisted Gait: x 320 ft with 3# AW  Seated LAQ  x 10 x 3 sec with 3# AW  Resisted Gait: x 320 ft with 3# AW  Seated LAQ  x  10 x 3 sec with 3# AW  Resisted Gait: x 320 ft with 3# AW   TA- To improve functional movements patterns for everyday tasks   Standing step taps on 6 in step with 3# AW  Standing side step with 3# AW  Box stepping ( forward, side, and retro stepping) x 3 rounds about 15 ft between with 3# AW donned  Walking over hurdles and around close cones for coordination x 3 rounds   Unless otherwise stated, CGA was provided and gait belt donned in order to ensure pt safety  PATIENT EDUCATION: Education details: Importance of performing L side skin assessments due to sensory deficits, checking hot/cold using R hand due to sensory deficits, findings of assessment today, exercises to perform at home until formal HEP developed Person educated: Patient and Spouse Education method: Explanation Education comprehension: verbalized understanding and needs further education  HOME EXERCISE PROGRAM:  Access Code: [email or web address] URL: [email or web address] Date: 08/06/2023 Prepared by: Marlynn Singer  Exercises - Standing March with Unilateral Counter Support  - 1 x daily - 7 x weekly - 2 sets - 10 reps - Sit to Stand Without Arm Support  - 1 x daily - 7 x weekly - 2 sets - 10 reps - Side Stepping with Counter Support  - 1 x daily - 7 x weekly - 3 sets - 10 reps  GOALS: Goals reviewed with patient? Yes  SHORT TERM GOALS: Target date: 09/10/2023  Patient will be independent in home exercise program to improve strength/mobility for better functional independence with ADLs and functional mobility.  Baseline: need to initiate Goal status: INITIAL   LONG TERM GOALS: Target date: 10/22/2023  Patient (> 78 years old) will complete five times sit to stand test (5XSTS) in < 12 seconds indicating an increased LE strength and improved balance.  Baseline: 15.12 seconds with arms across chest Goal status: INITIAL  2.  Patient will increase Berg Balance score to > 51/56 to demonstrate improved  balance and decreased fall risk during functional activities and ADLs.  Baseline: 38/56 Goal status: INITIAL  3.  Patient will increase six minute walk test ( ) distance to >1068ft for progression to community ambulator and improve gait ability  Baseline: 1,027 feet (313 meters, Avg speed 0.837m/s), no AD, with CGA and occasional min A Goal status: INITIAL  4.  Patient will increase 10 meter walk test to >1.65m/s as to improve gait speed for better community ambulation and to reduce fall risk. Baseline: 0.93 m/s without AD with CGA  Goal status: INITIAL  5.  Patient will increase Functional Gait Assessment (FGA) score to >20/30 as to reduce fall risk and improve dynamic gait safety with community ambulation.  Baseline: 5/30 Goal status: INITIAL  6.  Patient will improve Stroke Impact Scale-16 by >9 points indicating patient experiencing increased independence with functional mobility tasks and ADLs to improve quality of life and decrease caregiver burden.  Baseline: 35/80 Goal status: INITIAL  ASSESSMENT:  CLINICAL IMPRESSION:  Patient arrived with good motivation for completion of pt activities. Continued with current plan of care as laid out in evaluation and recent prior sessions. Pt remains motivated to advance progress toward goals in order to maximize independence and safety at home. Pt requires high level assistance and cuing for completion of exercises in order to provide adequate level of stimulation challenge while minimizing pain and discomfort when possible. Pt closely monitored throughout session pt response and to maximize patient safety during interventions. Pt continues to demonstrate progress toward goals AEB progression of interventions this date either in volume or intensity. Pt will continue to benefit from skilled physical therapy intervention to address impairments, improve QOL, and attain therapy goals.    OBJECTIVE IMPAIRMENTS: Abnormal gait, decreased activity  tolerance, decreased balance, decreased coordination, decreased endurance, decreased knowledge of condition, decreased knowledge of use of DME, decreased mobility, difficulty walking, decreased strength, increased fascial restrictions, impaired flexibility, impaired sensation, impaired UE functional use, improper body mechanics, and pain.   ACTIVITY LIMITATIONS: carrying, lifting, bending, sitting, standing, squatting, stairs, transfers, bathing, toileting, dressing, reach over head,  hygiene/grooming, and locomotion level  PARTICIPATION LIMITATIONS: meal prep, cleaning, laundry, interpersonal relationship, driving, shopping, community activity, and yard work  PERSONAL FACTORS: Age, Fitness, Time since onset of injury/illness/exacerbation, and 3+ comorbidities: Hyperlipidemia, BPH, HTN, hx of basal cell carcinoma, hx of squamous cell carcinoma, arthritis, R and L reverse shoulder arthroplasties (R in 2024 and L in 2017) are also affecting patient's functional outcome.   REHAB POTENTIAL: Excellent  CLINICAL DECISION MAKING: Evolving/moderate complexity  EVALUATION COMPLEXITY: Moderate  PLAN:  PT FREQUENCY: 1-2x/week  PT DURATION: 12 weeks  PLANNED INTERVENTIONS: [id number]- PT Re-evaluation, 97750- Physical Performance Testing, 97110-Therapeutic exercises, 97530- Therapeutic activity, [id number]- Neuromuscular re-education, 97535- Self Care, [id number]- Manual therapy, [id number]- Gait training, [phone]- Orthotic Initial, [phone]- Orthotic/Prosthetic subsequent, [phone]- Canalith repositioning, [phone]- Electrical stimulation (manual), Patient/Family education, Balance training, Stair training, Taping, Dry Needling, Joint mobilization, Joint manipulation, Spinal mobilization, Vestibular training, Visual/preceptual remediation/compensation, DME instructions, Cryotherapy, Moist heat, and Biofeedback  PLAN FOR NEXT SESSION:  - initiate HEP  - walking  - sit<>stands  - standing marches  - side stepping at counter  -  stretches for L LE sciatica - participate in higher level dynamic balance and gait challenges including:  - horizontal and vertical head turns  - stepping towards external targets due to LLE incoordination  - gait in variable directions   Note: Portions of this document were prepared using Dragon voice recognition software and although reviewed may contain unintentional dictation errors in syntax, grammar, or spelling.  Edwina Gram PT ,DPT Physical Therapist- [place]  [facility]   8:33 AM 08/11/23

## 2023-08-12 ENCOUNTER — Ambulatory Visit: Admitting: Physical Therapy

## 2023-08-12 DIAGNOSIS — R2681 Unsteadiness on feet: Secondary | ICD-10-CM

## 2023-08-12 DIAGNOSIS — R269 Unspecified abnormalities of gait and mobility: Secondary | ICD-10-CM | POA: Diagnosis not present

## 2023-08-12 DIAGNOSIS — I69352 Hemiplegia and hemiparesis following cerebral infarction affecting left dominant side: Secondary | ICD-10-CM

## 2023-08-12 DIAGNOSIS — M6281 Muscle weakness (generalized): Secondary | ICD-10-CM

## 2023-08-12 DIAGNOSIS — R208 Other disturbances of skin sensation: Secondary | ICD-10-CM

## 2023-08-12 NOTE — Therapy (Signed)
 OUTPATIENT PHYSICAL THERAPY NEURO TREATMENT   Patient Name: George Bruce MRN: [id number] DOB:1946/03/07, 78 y.o., male Today's Date: 08/12/2023   PCP: Yehuda Helms, MD  REFERRING PROVIDER: Yehuda Helms, MD   END OF SESSION:   PT End of Session - 08/12/23 1102     Visit Number 5    Number of Visits 24    Date for PT Re-Evaluation 10/22/23    Progress Note Due on Visit 10    PT Start Time 1102    PT Stop Time 1142    PT Time Calculation (min) 40 min    Equipment Utilized During Treatment Gait belt    Activity Tolerance Patient tolerated treatment well    Behavior During Therapy WFL for tasks assessed/performed                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   Hypertension    Wears dentures    partial upper and lower   Past Surgical History:  Procedure Laterality Date   CATARACT EXTRACTION W/PHACO Right 09/29/2022   Procedure: CATARACT EXTRACTION PHACO AND INTRAOCULAR LENS PLACEMENT (IOC) RIGHT  10.98  00:56.5;  Surgeon: Rosa College, MD;  Location: [facility];  Service: Ophthalmology;  Laterality: Right;   CATARACT EXTRACTION W/PHACO Left 10/13/2022   Procedure: CATARACT EXTRACTION PHACO AND INTRAOCULAR LENS PLACEMENT (IOC) LEFT 10.79 00:54.1;  Surgeon: Rosa College, MD;  Location: [facility];  Service: Ophthalmology;  Laterality: Left;   COLONOSCOPY WITH PROPOFOL  N/A 04/21/2018   Procedure: COLONOSCOPY WITH PROPOFOL ;  Surgeon: Irby Mannan, MD;  Location: [facility];  Service: Endoscopy;  Laterality: N/A;   INGUINAL HERNIA REPAIR Left 1990's   INGUINAL HERNIA REPAIR Right 2015   REVERSE SHOULDER ARTHROPLASTY Left 11/06/2015   Procedure: REVERSE SHOULDER ARTHROPLASTY;  Surgeon: Elner Hahn, MD;  Location: [facility];  Service: Orthopedics;  Laterality: Left;   REVERSE SHOULDER ARTHROPLASTY Right 03/25/2022   Procedure: REVERSE SHOULDER ARTHROPLASTY;  Surgeon: Elner Hahn, MD;  Location: [facility];  Service: Orthopedics;  Laterality: Right;  MAKE 2ND CASE   Patient Active Problem List   Diagnosis Date Noted   Acute CVA (cerebrovascular accident) (HCC) 07/21/2023   Elevated blood pressure reading 07/21/2023   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   Acute diverticulitis 05/07/2018   History of colonic polyps    Benign neoplasm of ascending colon    Diverticulosis of large intestine without diverticulitis    Erectile dysfunction due to arterial insufficiency 02/18/2018   Degenerative disc disease, lumbar 08/26/2016   Bilateral hip pain 06/24/2016   Chronic midline low back pain 06/24/2016   Polyarthralgia 06/24/2016   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   Hematospermia 05/03/2015   Colon polyps 08/16/2013   Hematuria 08/16/2013   Hyperlipidemia 08/16/2013   Incomplete emptying of bladder 06/23/2013   Benign prostatic hyperplasia with incomplete bladder emptying 11/11/2011   Elevated PSA 11/11/2011   Reduced libido 11/11/2011    ONSET DATE: 07/20/2023 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, right PCA occlusion   REFERRING DIAG: I63.50 (ICD-10-CM) - Cerebral infarction due to unspecified occlusion or  stenosis of unspecified cerebral artery   THERAPY DIAG:  Abnormality of gait  Other disturbances of skin sensation  Unsteadiness on feet  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   From Initial Eval: Pt states he has hx of sciatica in L LE. Pt states recent CVA affected his L side and he is having numbness in L UE and LE. Pt states the CVA affected him from his jaw down through his L arm and L LE. Pt states he is left hand dominant. Pt states he was walking ~84miles daily and performing all household chores prior to CVA. Patient states he was very active and completely independent prior.   Pt states he had MRI yesterday for L LE sciatica and is going to see MD tomorrow to follow-up on results (with [facility]). Patient reporting current pain as 6/10 with it being greater this morning, but he took pain medication to ease it off prior to coming to therapy.   Pt accompanied by: significant other, Wife, [name]  PERTINENT HISTORY: PMH of Hyperlipidemia, BPH, HTN, hx of basal cell carcinoma, hx of squamous cell carcinoma, arthritis, R and L reverse shoulder arthroplasties (R in 2024 and L in 2017)  PAIN:  Are you having pain? Yes: NPRS scale: 6/10 after taking pain medication this AM Pain location: L LE sciatica Pain description: severe ache Aggravating factors: he can wake up with it, no specific aggravating factors Relieving factors: pt reports having to take pain medication (hydrocodone ) this AM, hx of having injections and prednisone ; uses horse liniment   PRECAUTIONS: Fall  RED FLAGS: None   WEIGHT BEARING RESTRICTIONS: No  FALLS: Has patient fallen in last 6 months? No and but states had a few stumbles getting his feet tangled up  LIVING ENVIRONMENT: Lives with: lives with their spouse Lives in: House/apartment Stairs: Yes: External: 3+1 steps; on right going up; 1 level house Has following equipment at home: Walker - 2 wheeled and built-in shower seat  PLOF:  Independent, Independent with household mobility without device, Independent with homemaking with ambulation, Independent with gait, and Leisure: golf  PATIENT GOALS: "get back to  half normal" being able to do yard work, wash a car, and golf  OBJECTIVE:  Note: Objective measures were completed at Evaluation unless otherwise noted.  DIAGNOSTIC FINDINGS:  EXAM: MRI HEAD WITHOUT CONTRAST   TECHNIQUE: Multiplanar, multiecho pulse sequences of the brain and surrounding structures were obtained without intravenous contrast.   COMPARISON:  CT head, CTA, CTP yesterday.  Brain MRI 02/22/2008.  IMPRESSION: 1. Positive for Acute lacunar infarct of the lateral Right Thalamus, but also abnormal diffusion throughout the right hippocampal formation. Both are Right PCA territory, although superimposed seizure related changes of the right hippocampus would be difficult to exclude. No associated hemorrhage or mass effect.   2. 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 No other acute intracranial abnormality identified.   Electronically Signed   By: Marlise Simpers M.D.   On: 07/21/2023 05:51  CTA head:   1. 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 There is some reconstitution of enhancement within right posterior cerebral artery branches beginning at the distal P2 segment level. 2. 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 3. Atherosclerotic plaque within the intracranial internal carotid arteries with no more than mild stenosis.     Electronically Signed   By: Bascom Lily D.O.   On: 07/20/2023 19:32   COGNITION: Overall cognitive status: Within functional limits for tasks assessed   SENSATION: Light touch:  Impaired  and basically absent Proprioception: Impaired  Can only feel deep pressure in L LE  COORDINATION: Incoordinated in L LE, likely due to sensory impairments  EDEMA:  Not formally assessed  MUSCLE TONE: Not formally assessed  MUSCLE LENGTH: Not formally assessed  DTRs:  Not formally assessed  POSTURE: rounded shoulders and forward head  LOWER EXTREMITY ROM:     Active  Right Eval  WFL/WNL throughout Left Eval  WFL/WNL throughout  Hip flexion    Hip extension    Hip abduction    Hip adduction    Hip internal rotation    Hip external rotation    Knee flexion    Knee extension    Ankle dorsiflexion    Ankle plantarflexion    Ankle inversion    Ankle eversion     (Blank rows = not tested)  LOWER EXTREMITY MMT:    MMT Right Eval Left Eval  Hip flexion 4+ 3+  Hip extension    Hip abduction    Hip adduction    Hip internal rotation    Hip external rotation    Knee flexion 4+ 4-  Knee extension 4+ 4+  Ankle dorsiflexion 4+ 3  Ankle plantarflexion 4+ 4-  Ankle inversion    Ankle eversion    (Blank rows = not tested)  Manual Muscle Test Scale 0/5 = No muscle contraction can be seen or felt 1/5 = Contraction can be felt, but there is no motion 2-/5 = Part moves through incomplete ROM w/ gravity decreased 2/5 = Part moves through complete ROM w/ gravity decreased 2+/5 = Part moves through incomplete ROM (<50%) against gravity or through complete ROM w/ gravity 3-/5 = Part moves through incomplete ROM (>50%) against gravity 3/5 = Part moves through complete ROM against gravity 3+/5 = Part moves through complete ROM against gravity/slight resistance 4-/5=  Holds test position against slight to moderate pressure 4/5 = Part moves through complete ROM against gravity/moderate resistance 4+/5= Holds test position against moderate to strong pressure 5/5 = Part moves through complete ROM against gravity/full resistance  BED MOBILITY:  Not tested, pt  denies difficulty with this  TRANSFERS: Sit to stand: SBA and CGA  Assistive device utilized: Environmental consultant - 2 wheeled and None     Stand to sit: SBA and CGA  Assistive device utilized: Environmental consultant - 2 wheeled and None     Chair to chair: SBA and CGA  Assistive device utilized: Environmental consultant - 2 wheeled and None      *requires CGA when not using AD* RAMP:  Not tested  CURB:  Not tested  STAIRS: Findings: Level of Assistance: CGA and Min A, Stair Negotiation Technique: Step to Pattern Forwards with Single Rail on Right, Number of Stairs: 4, Height of Stairs: 6in   , and Comments: incoordinated, but no greater than min A for balance; only used R HR to simulate home environment GAIT: Findings: Gait Characteristics: step through pattern, decreased arm swing- Right, decreased arm swing- Left, decreased step length- Right, decreased step length- Left, decreased stride length, and wide BOS, Distance walked: 7ft + 333ft, Assistive device utilized:None, Level of assistance: CGA and Min A, and Comments: pt uses RW for all ambulation at home, but didn't use AD during session to assess patient's gait without  FUNCTIONAL TESTS:  5 times sit to stand: 15.12 seconds with arms across chest  6 minute walk test: need to assess 10 meter walk test: 0.93 m/s without AD with CGA for safety  Berg Balance Scale: need to assess Functional gait assessment: need to assess   PATIENT SURVEYS:  Stroke Impact Scale 35/80                                                                                                                              TREATMENT DATE: 07/30/2023  TE- To improve strength, endurance, mobility, and function of specific targeted muscle groups or improve joint range of motion or improve muscle flexibility alternating with TA- To improve functional movements patterns for everyday tasks   Resisted Gait: x 470 ft with 3# AW  Pt notes a spot on his heel where weights rubbing on skin, light abrasion noted, PT  cleaned with alcohol pad and covered with band aid to prevent any abrasion on shoe during later treatments as pt did not wear socks this date.  STS with 5KG ball 2 x 10 reps   NMR: To facilitate reeducation of movement, balance, posture, coordination, and/or proprioception/kinesthetic sense. Activity Description: 4 obstacles in a box with pods at cornesr. Pt has to react to which pod lights up, decide route to take as well as pick up feet to avoid LOB Activity Setting:  Random Number of Pods:  4 Cycles/Sets:  3 Duration (Time or Hit Count):  1.5 min with rest between sets  Patient Stats  Hits:   9,14, 14 respectively     PATIENT EDUCATION: Education details: Importance of performing L side skin assessments due to sensory deficits, checking hot/cold using R hand due to sensory deficits, findings of assessment today, exercises to perform at home until formal HEP developed Person educated: Patient and Spouse Education method: Explanation Education comprehension: verbalized understanding and needs further education  HOME EXERCISE PROGRAM:  Access Code: [email or web address] URL: [email or web address] Date: 08/06/2023 Prepared by: Marlynn Singer  Exercises - Standing March with Unilateral Counter Support  - 1 x daily - 7 x weekly - 2 sets - 10 reps - Sit to Stand Without Arm Support  - 1 x daily - 7 x weekly - 2 sets - 10 reps - Side Stepping with Counter Support  - 1 x daily - 7 x weekly - 3 sets - 10 reps  GOALS: Goals reviewed with patient? Yes  SHORT TERM GOALS: Target date: 09/10/2023  Patient will be independent in home exercise program to improve strength/mobility for better functional independence with ADLs and functional mobility.  Baseline: need to initiate Goal status: INITIAL   LONG TERM GOALS: Target date: 10/22/2023  Patient (> 100 years old) will complete five times sit to stand test (5XSTS) in < 12 seconds indicating an increased LE strength and improved  balance.  Baseline: 15.12 seconds with arms across chest Goal status: INITIAL  2.  Patient will increase Berg Balance score to > 51/56 to demonstrate improved balance and decreased fall risk during functional activities and ADLs.  Baseline: 38/56 Goal status: INITIAL  3.  Patient will increase six minute walk test ( ) distance to >1015ft for progression to community ambulator and improve gait ability  Baseline: 1,027 feet (313 meters, Avg speed 0.867m/s), no AD, with CGA and occasional min A Goal status: INITIAL  4.  Patient will increase 10 meter walk test to >1.60m/s as to improve gait speed for better community ambulation and to reduce fall risk. Baseline: 0.93 m/s without AD with CGA  Goal status: INITIAL  5.  Patient will increase Functional Gait Assessment (FGA) score to >20/30 as to reduce fall risk and improve dynamic gait safety with community ambulation.  Baseline: 5/30 Goal status: INITIAL  6.  Patient will improve Stroke Impact Scale-16 by >9 points indicating patient experiencing increased independence with functional mobility tasks and ADLs to improve quality of life and decrease caregiver burden.  Baseline: 35/80 Goal status: INITIAL  ASSESSMENT:  CLINICAL IMPRESSION:  Patient arrived with good motivation for completion of pt activities. Continued with current plan of care as laid out in evaluation and recent prior sessions. Pt remains motivated to advance progress toward goals in order to maximize independence and safety at home. Pt requires high level assistance and cuing for completion of exercises in order to provide adequate level of stimulation challenge while minimizing pain and discomfort when possible. Pt closely monitored throughout session pt response and to maximize patient safety during interventions. The patient demonstrated significant progress while utilizing [employer], showcasing improved coordination, balance, and cognitive function. The incorporation  of dual-tasking technology with color recognition and association with specific movements in [employer] was strategically chosen to provide a dynamic training environment, enabling the patient to engage in simultaneous physical and cognitive tasks. This unique approach enhances not only their physical abilities but also fosters increased neural connectivity and mental awareness, contributing to a well-rounded and effective rehabilitation and training experience.  Pt will continue to benefit from skilled physical therapy  intervention to address impairments, improve QOL, and attain therapy goals.    OBJECTIVE IMPAIRMENTS: Abnormal gait, decreased activity tolerance, decreased balance, decreased coordination, decreased endurance, decreased knowledge of condition, decreased knowledge of use of DME, decreased mobility, difficulty walking, decreased strength, increased fascial restrictions, impaired flexibility, impaired sensation, impaired UE functional use, improper body mechanics, and pain.   ACTIVITY LIMITATIONS: carrying, lifting, bending, sitting, standing, squatting, stairs, transfers, bathing, toileting, dressing, reach over head, hygiene/grooming, and locomotion level  PARTICIPATION LIMITATIONS: meal prep, cleaning, laundry, interpersonal relationship, driving, shopping, community activity, and yard work  PERSONAL FACTORS: Age, Fitness, Time since onset of injury/illness/exacerbation, and 3+ comorbidities: Hyperlipidemia, BPH, HTN, hx of basal cell carcinoma, hx of squamous cell carcinoma, arthritis, R and L reverse shoulder arthroplasties (R in 2024 and L in 2017) are also affecting patient's functional outcome.   REHAB POTENTIAL: Excellent  CLINICAL DECISION MAKING: Evolving/moderate complexity  EVALUATION COMPLEXITY: Moderate  PLAN:  PT FREQUENCY: 1-2x/week  PT DURATION: 12 weeks  PLANNED INTERVENTIONS: [id number]- PT Re-evaluation, 97750- Physical Performance Testing, 97110-Therapeutic  exercises, 97530- Therapeutic activity, [id number]- Neuromuscular re-education, 97535- Self Care, [id number]- Manual therapy, [id number]- Gait training, [phone]- Orthotic Initial, [phone]- Orthotic/Prosthetic subsequent, [phone]- Canalith repositioning, [phone]- Electrical stimulation (manual), Patient/Family education, Balance training, Stair training, Taping, Dry Needling, Joint mobilization, Joint manipulation, Spinal mobilization, Vestibular training, Visual/preceptual remediation/compensation, DME instructions, Cryotherapy, Moist heat, and Biofeedback  PLAN FOR NEXT SESSION:  - initiate HEP  - walking  - sit<>stands  - standing marches  - side stepping at counter  - stretches for L LE sciatica - participate in higher level dynamic balance and gait challenges including:  - horizontal and vertical head turns  - stepping towards external targets due to LLE incoordination  - gait in variable directions   Note: Portions of this document were prepared using Dragon voice recognition software and although reviewed may contain unintentional dictation errors in syntax, grammar, or spelling.  Edwina Gram PT ,DPT Physical Therapist- [facility]   11:03 AM 08/12/23

## 2023-08-19 ENCOUNTER — Ambulatory Visit: Admitting: Physical Therapy

## 2023-08-19 DIAGNOSIS — R2681 Unsteadiness on feet: Secondary | ICD-10-CM

## 2023-08-19 DIAGNOSIS — M6281 Muscle weakness (generalized): Secondary | ICD-10-CM

## 2023-08-19 DIAGNOSIS — R269 Unspecified abnormalities of gait and mobility: Secondary | ICD-10-CM | POA: Diagnosis not present

## 2023-08-19 DIAGNOSIS — R208 Other disturbances of skin sensation: Secondary | ICD-10-CM

## 2023-08-19 DIAGNOSIS — M5432 Sciatica, left side: Secondary | ICD-10-CM

## 2023-08-19 DIAGNOSIS — I69352 Hemiplegia and hemiparesis following cerebral infarction affecting left dominant side: Secondary | ICD-10-CM

## 2023-08-19 NOTE — Therapy (Signed)
 OUTPATIENT PHYSICAL THERAPY NEURO TREATMENT   Patient Name: WILKINS ELPERS MRN: [id number] DOB:05-31-1945, 78 y.o., male Today's Date: 08/19/2023   PCP: Yehuda Helms, MD  REFERRING PROVIDER: Yehuda Helms, MD   END OF SESSION:   PT End of Session - 08/19/23 0844     Visit Number 6    Number of Visits 24    Date for PT Re-Evaluation 10/22/23    Progress Note Due on Visit 10    PT Start Time 0846    PT Stop Time 0926    PT Time Calculation (min) 40 min    Equipment Utilized During Treatment Gait belt    Activity Tolerance Patient tolerated treatment well    Behavior During Therapy WFL for tasks assessed/performed                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   Hypertension    Wears dentures    partial upper and lower   Past Surgical History:  Procedure Laterality Date   CATARACT EXTRACTION W/PHACO Right 09/29/2022   Procedure: CATARACT EXTRACTION PHACO AND INTRAOCULAR LENS PLACEMENT (IOC) RIGHT  10.98  00:56.5;  Surgeon: Rosa College, MD;  Location: [facility];  Service: Ophthalmology;  Laterality: Right;   CATARACT EXTRACTION W/PHACO Left 10/13/2022   Procedure: CATARACT EXTRACTION PHACO AND INTRAOCULAR LENS PLACEMENT (IOC) LEFT 10.79 00:54.1;  Surgeon: Rosa College, MD;  Location: [facility];  Service: Ophthalmology;  Laterality: Left;   COLONOSCOPY WITH PROPOFOL  N/A 04/21/2018   Procedure: COLONOSCOPY WITH PROPOFOL ;  Surgeon: Irby Mannan, MD;  Location: [facility];  Service: Endoscopy;  Laterality: N/A;   INGUINAL HERNIA REPAIR Left 1990's   INGUINAL HERNIA REPAIR Right 2015   REVERSE SHOULDER ARTHROPLASTY Left 11/06/2015   Procedure: REVERSE SHOULDER ARTHROPLASTY;  Surgeon: Elner Hahn, MD;  Location: [facility];  Service: Orthopedics;  Laterality: Left;   REVERSE SHOULDER ARTHROPLASTY Right 03/25/2022   Procedure: REVERSE SHOULDER ARTHROPLASTY;  Surgeon: Elner Hahn, MD;  Location: [facility];  Service: Orthopedics;  Laterality: Right;  MAKE 2ND CASE   Patient Active Problem List   Diagnosis Date Noted   Acute CVA (cerebrovascular accident) (HCC) 07/21/2023   Elevated blood pressure reading 07/21/2023   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   Acute diverticulitis 05/07/2018   History of colonic polyps    Benign neoplasm of ascending colon    Diverticulosis of large intestine without diverticulitis    Erectile dysfunction due to arterial insufficiency 02/18/2018   Degenerative disc disease, lumbar 08/26/2016   Bilateral hip pain 06/24/2016   Chronic midline low back pain 06/24/2016   Polyarthralgia 06/24/2016   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   Hematospermia 05/03/2015   Colon polyps 08/16/2013   Hematuria 08/16/2013   Hyperlipidemia 08/16/2013   Incomplete emptying of bladder 06/23/2013   Benign prostatic hyperplasia with incomplete bladder emptying 11/11/2011   Elevated PSA 11/11/2011   Reduced libido 11/11/2011    ONSET DATE: 07/20/2023 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, right PCA occlusion   REFERRING DIAG: I63.50 (ICD-10-CM) - Cerebral infarction due to unspecified occlusion or  stenosis of unspecified cerebral artery   THERAPY DIAG:  Abnormality of gait  Other disturbances of skin sensation  Unsteadiness on feet  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  States that he has been approved for pain management injection, by insurance, and will be calling MD office after PT to set up appointment to receive injection.  From Initial Eval: Pt states he has hx of sciatica in L LE. Pt states recent CVA affected his L side and he is having numbness in L UE and LE. Pt states the CVA affected him from his jaw down through his L arm and L LE. Pt states he is left hand dominant. Pt states he was walking ~42miles daily and performing all household chores prior to CVA. Patient states he was very active and completely independent prior.   Pt states he had MRI yesterday for L LE sciatica and is going to see MD tomorrow to follow-up on results (with [facility]). Patient reporting current pain as 6/10 with it being greater this morning, but he took pain medication to ease it off prior to coming to therapy.   Pt accompanied by: significant other, Wife, [name]  PERTINENT HISTORY: PMH of Hyperlipidemia, BPH, HTN, hx of basal cell carcinoma, hx of squamous cell carcinoma, arthritis, R and L reverse shoulder arthroplasties (R in 2024 and L in 2017)  PAIN:  Are you having pain? Yes: NPRS scale: 6/10 after taking pain medication this AM Pain location: L LE sciatica Pain description: severe ache Aggravating factors: he can wake up with it, no specific aggravating factors Relieving factors: pt reports having to take pain medication (hydrocodone ) this AM, hx of having injections and prednisone ; uses horse liniment   PRECAUTIONS: Fall  RED FLAGS: None   WEIGHT BEARING RESTRICTIONS: No  FALLS: Has patient fallen in last 6 months? No and but states had a few stumbles getting his feet tangled  up  LIVING ENVIRONMENT: Lives with: lives with their spouse Lives in: House/apartment Stairs: Yes: External: 3+1 steps; on right going up; 1 level house  Has following equipment at home: [employer] - 2 wheeled and built-in shower seat  PLOF: Independent, Independent with household mobility without device, Independent with homemaking with ambulation, Independent with gait, and Leisure: golf  PATIENT GOALS: get back to half normal being able to do yard work, wash a car, and golf  OBJECTIVE:  Note: Objective measures were completed at Evaluation unless otherwise noted.  DIAGNOSTIC FINDINGS:  EXAM: MRI HEAD WITHOUT CONTRAST   TECHNIQUE: Multiplanar, multiecho pulse sequences of the brain and surrounding structures were obtained without intravenous contrast.   COMPARISON:  CT head, CTA, CTP yesterday.  Brain MRI 02/22/2008.  IMPRESSION: 1. Positive for Acute lacunar infarct of the lateral Right Thalamus, but also abnormal diffusion throughout the right hippocampal formation. Both are Right PCA territory, although superimposed seizure related changes of the right hippocampus would be difficult to exclude. No associated hemorrhage or mass effect.   2. 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 No other acute intracranial abnormality identified.   Electronically Signed   By: Marlise Simpers M.D.   On: 07/21/2023 05:51  CTA head:   1. 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 There is some reconstitution of enhancement within right posterior cerebral artery branches beginning at the distal P2 segment level. 2. 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 3. Atherosclerotic plaque within the intracranial internal  carotid arteries with no more than mild stenosis.     Electronically Signed   By: Bascom Lily D.O.   On: 07/20/2023 19:32   COGNITION: Overall cognitive status: Within functional limits for tasks assessed   SENSATION: Light touch: Impaired  and basically absent Proprioception: Impaired  Can only feel deep pressure in L LE  COORDINATION: Incoordinated in L LE, likely due to sensory impairments  EDEMA:  Not formally assessed  MUSCLE TONE: Not formally assessed  MUSCLE LENGTH: Not formally assessed  DTRs:  Not formally assessed  POSTURE: rounded shoulders and forward head  LOWER EXTREMITY ROM:     Active  Right Eval  WFL/WNL throughout Left Eval  WFL/WNL throughout  Hip flexion    Hip extension    Hip abduction    Hip adduction    Hip internal rotation    Hip external rotation    Knee flexion    Knee extension    Ankle dorsiflexion    Ankle plantarflexion    Ankle inversion    Ankle eversion     (Blank rows = not tested)  LOWER EXTREMITY MMT:    MMT Right Eval Left Eval  Hip flexion 4+ 3+  Hip extension    Hip abduction    Hip adduction    Hip internal rotation    Hip external rotation    Knee flexion 4+ 4-  Knee extension 4+ 4+  Ankle dorsiflexion 4+ 3  Ankle plantarflexion 4+ 4-  Ankle inversion    Ankle eversion    (Blank rows = not tested)  Manual Muscle Test Scale 0/5 = No muscle contraction can be seen or felt 1/5 = Contraction can be felt, but there is no motion 2-/5 = Part moves through incomplete ROM w/ gravity decreased 2/5 = Part moves through complete ROM w/ gravity decreased 2+/5 = Part moves through incomplete ROM (<  50%) against gravity or through complete ROM w/ gravity 3-/5 = Part moves through incomplete ROM (>50%) against gravity 3/5 = Part moves through complete ROM against gravity 3+/5 = Part moves through complete ROM against gravity/slight resistance 4-/5= Holds test position against slight to moderate  pressure 4/5 = Part moves through complete ROM against gravity/moderate resistance 4+/5= Holds test position against moderate to strong pressure 5/5 = Part moves through complete ROM against gravity/full resistance  BED MOBILITY:  Not tested, pt denies difficulty with this  TRANSFERS: Sit to stand: SBA and CGA  Assistive device utilized: Environmental consultant - 2 wheeled and None     Stand to sit: SBA and CGA  Assistive device utilized: Environmental consultant - 2 wheeled and None     Chair to chair: SBA and CGA  Assistive device utilized: Environmental consultant - 2 wheeled and None      *requires CGA when not using AD* RAMP:  Not tested  CURB:  Not tested  STAIRS: Findings: Level of Assistance: CGA and Min A, Stair Negotiation Technique: Step to Pattern Forwards with Single Rail on Right, Number of Stairs: 4, Height of Stairs: 6in   , and Comments: incoordinated, but no greater than min A for balance; only used R HR to simulate home environment GAIT: Findings: Gait Characteristics: step through pattern, decreased arm swing- Right, decreased arm swing- Left, decreased step length- Right, decreased step length- Left, decreased stride length, and wide BOS, Distance walked: 27ft + 366ft, Assistive device utilized:None, Level of assistance: CGA and Min A, and Comments: pt uses RW for all ambulation at home, but didn't use AD during session to assess patient's gait without  FUNCTIONAL TESTS:  5 times sit to stand: 15.12 seconds with arms across chest  6 minute walk test: need to assess 10 meter walk test: 0.93 m/s without AD with CGA for safety  Berg Balance Scale: need to assess Functional gait assessment: need to assess   PATIENT SURVEYS:  Stroke Impact Scale 35/80                                                                                                                              TREATMENT DATE: 07/30/2023 TA:  NUstpe level 1-5 x 5 min with varied resistance. Cues for consistent SPM and step length on BLE  through varied  resistance.   PT applied gait belt and provided CGA assist for all dynamic balance training with min cues for step length intermittently to reduce fall risk.   Forward/reverse gait with no UE support 68ft x 5 Side stepping 65ft R and L  Weave through 12 cones x 4  Side stepping between 6 cones; forward then backward x 3 bouts  Side stepping over hedge, hogs 6 x 4 bil with cues for improved step length/height to navigate obstacles.  NMR Kore balance Tux racer x 3 bouts, 15 fish, 33fish, 47fish. Increased time on each bout, but improved control of COP for each bout.  Attempted SDL ball, but unable to understand how to control or play game on this day,    PATIENT EDUCATION: Education details: Importance of performing L side skin assessments due to sensory deficits, checking hot/cold using R hand due to sensory deficits, findings of assessment today, exercises to perform at home until formal HEP developed Person educated: Patient and Spouse Education method: Explanation Education comprehension: verbalized understanding and needs further education  HOME EXERCISE PROGRAM:  Access Code: [email or web address] URL: [email or web address] Date: 08/06/2023 Prepared by: Marlynn Singer  Exercises - Standing March with Unilateral Counter Support  - 1 x daily - 7 x weekly - 2 sets - 10 reps - Sit to Stand Without Arm Support  - 1 x daily - 7 x weekly - 2 sets - 10 reps - Side Stepping with Counter Support  - 1 x daily - 7 x weekly - 3 sets - 10 reps  GOALS: Goals reviewed with patient? Yes  SHORT TERM GOALS: Target date: 09/10/2023  Patient will be independent in home exercise program to improve strength/mobility for better functional independence with ADLs and functional mobility.  Baseline: need to initiate Goal status: INITIAL   LONG TERM GOALS: Target date: 10/22/2023  Patient (> 82 years old) will complete five times sit to stand test (5XSTS) in < 12 seconds indicating an increased  LE strength and improved balance.  Baseline: 15.12 seconds with arms across chest Goal status: INITIAL  2.  Patient will increase Berg Balance score to > 51/56 to demonstrate improved balance and decreased fall risk during functional activities and ADLs.  Baseline: 38/56 Goal status: INITIAL  3.  Patient will increase six minute walk test ( ) distance to >1020ft for progression to community ambulator and improve gait ability  Baseline: 1,027 feet (313 meters, Avg speed 0.849m/s), no AD, with CGA and occasional min A Goal status: INITIAL  4.  Patient will increase 10 meter walk test to >1.99m/s as to improve gait speed for better community ambulation and to reduce fall risk. Baseline: 0.93 m/s without AD with CGA  Goal status: INITIAL  5.  Patient will increase Functional Gait Assessment (FGA) score to >20/30 as to reduce fall risk and improve dynamic gait safety with community ambulation.  Baseline: 5/30 Goal status: INITIAL  6.  Patient will improve Stroke Impact Scale-16 by >9 points indicating patient experiencing increased independence with functional mobility tasks and ADLs to improve quality of life and decrease caregiver burden.  Baseline: 35/80 Goal status: INITIAL  ASSESSMENT:  CLINICAL IMPRESSION:  Patient arrived with good motivation for completion of pt activities. Continued with current plan of care as laid out in evaluation and recent prior sessions. Increased pain reported in low back and LLE, but did not affect ability to participate in dynamic balance interventions. PT treatment focused on dynamic balance to challenge functional movement patterns in various planes of movement. Only mild LOB noted with min cues for improved step height/length with side stepping over obstacles. Improved control of COP with use of KORE balance and multiple attempts. Pt remains motivated to advance progress toward goals in order to maximize independence and safety at home.  Pt will  continue to benefit from skilled physical therapy intervention to address impairments, improve QOL, and attain therapy goals.    OBJECTIVE IMPAIRMENTS: Abnormal gait, decreased activity tolerance, decreased balance, decreased coordination, decreased endurance, decreased knowledge of condition, decreased knowledge of use of DME, decreased mobility, difficulty walking, decreased strength, increased fascial restrictions, impaired flexibility, impaired sensation, impaired UE functional  use, improper body mechanics, and pain.   ACTIVITY LIMITATIONS: carrying, lifting, bending, sitting, standing, squatting, stairs, transfers, bathing, toileting, dressing, reach over head, hygiene/grooming, and locomotion level  PARTICIPATION LIMITATIONS: meal prep, cleaning, laundry, interpersonal relationship, driving, shopping, community activity, and yard work  PERSONAL FACTORS: Age, Fitness, Time since onset of injury/illness/exacerbation, and 3+ comorbidities: Hyperlipidemia, BPH, HTN, hx of basal cell carcinoma, hx of squamous cell carcinoma, arthritis, R and L reverse shoulder arthroplasties (R in 2024 and L in 2017) are also affecting patient's functional outcome.   REHAB POTENTIAL: Excellent  CLINICAL DECISION MAKING: Evolving/moderate complexity  EVALUATION COMPLEXITY: Moderate  PLAN:  PT FREQUENCY: 1-2x/week  PT DURATION: 12 weeks  PLANNED INTERVENTIONS: 97164- PT Re-evaluation, 97750- Physical Performance Testing, 97110-Therapeutic exercises, 97530- Therapeutic activity, [id number]- Neuromuscular re-education, 97535- Self Care, 86578- Manual therapy, [phone]- Gait training, [phone]- Orthotic Initial, [phone]- Orthotic/Prosthetic subsequent, [phone]- Canalith repositioning, [phone]- Electrical stimulation (manual), Patient/Family education, Balance training, Stair training, Taping, Dry Needling, Joint mobilization, Joint manipulation, Spinal mobilization, Vestibular training, Visual/preceptual remediation/compensation,  DME instructions, Cryotherapy, Moist heat, and Biofeedback  PLAN FOR NEXT SESSION:  Continue POC  - participate in higher level dynamic balance and gait challenges including:  - horizontal and vertical head turns  - stepping towards external targets due to LLE incoordination  - gait in variable directions   Barbara Book PT ,DPT Physical Therapist- [facility]  [facility]   8:45 AM 08/19/23

## 2023-08-21 ENCOUNTER — Ambulatory Visit: Admitting: Physical Therapy

## 2023-08-21 DIAGNOSIS — R269 Unspecified abnormalities of gait and mobility: Secondary | ICD-10-CM | POA: Diagnosis not present

## 2023-08-21 DIAGNOSIS — I69352 Hemiplegia and hemiparesis following cerebral infarction affecting left dominant side: Secondary | ICD-10-CM

## 2023-08-21 DIAGNOSIS — M5432 Sciatica, left side: Secondary | ICD-10-CM

## 2023-08-21 DIAGNOSIS — R208 Other disturbances of skin sensation: Secondary | ICD-10-CM

## 2023-08-21 DIAGNOSIS — R2681 Unsteadiness on feet: Secondary | ICD-10-CM

## 2023-08-21 DIAGNOSIS — M6281 Muscle weakness (generalized): Secondary | ICD-10-CM

## 2023-08-21 NOTE — Therapy (Signed)
 OUTPATIENT PHYSICAL THERAPY NEURO TREATMENT   Patient Name: George Bruce MRN: [id number] DOB:1945/08/07, 78 y.o., male Today's Date: 08/21/2023   PCP: Yehuda Helms, MD  REFERRING PROVIDER: Yehuda Helms, MD   END OF SESSION:   PT End of Session - 08/21/23 0923     Visit Number 7    Number of Visits 24    Date for PT Re-Evaluation 10/22/23    Progress Note Due on Visit 10    PT Start Time 0930    PT Stop Time 1010    PT Time Calculation (min) 40 min    Equipment Utilized During Treatment Gait belt    Activity Tolerance Patient tolerated treatment well    Behavior During Therapy WFL for tasks assessed/performed             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   Hypertension    Wears dentures    partial upper and lower   Past Surgical History:  Procedure Laterality Date   CATARACT EXTRACTION W/PHACO Right 09/29/2022   Procedure: CATARACT EXTRACTION PHACO AND INTRAOCULAR LENS PLACEMENT (IOC) RIGHT  10.98  00:56.5;  Surgeon: Rosa College, MD;  Location: [facility];  Service: Ophthalmology;  Laterality: Right;   CATARACT EXTRACTION W/PHACO Left 10/13/2022   Procedure: CATARACT EXTRACTION PHACO AND INTRAOCULAR LENS PLACEMENT (IOC) LEFT 10.79 00:54.1;  Surgeon: Rosa College, MD;  Location: [facility];  Service: Ophthalmology;  Laterality: Left;   COLONOSCOPY WITH PROPOFOL  N/A 04/21/2018   Procedure: COLONOSCOPY WITH PROPOFOL ;  Surgeon: Irby Mannan, MD;  Location: [facility];  Service: Endoscopy;  Laterality: N/A;   INGUINAL HERNIA REPAIR Left 1990's   INGUINAL HERNIA REPAIR Right 2015   REVERSE SHOULDER ARTHROPLASTY Left 11/06/2015   Procedure: REVERSE SHOULDER ARTHROPLASTY;  Surgeon: Elner Hahn, MD;  Location: [facility];  Service: Orthopedics;  Laterality: Left;   REVERSE SHOULDER ARTHROPLASTY Right 03/25/2022   Procedure: REVERSE SHOULDER ARTHROPLASTY;  Surgeon: Elner Hahn, MD;  Location: [facility];  Service: Orthopedics;  Laterality: Right;  MAKE 2ND CASE   Patient Active Problem List   Diagnosis Date Noted   Acute CVA (cerebrovascular accident) (HCC) 07/21/2023   Elevated blood pressure reading 07/21/2023   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   Acute diverticulitis 05/07/2018   History of colonic polyps    Benign neoplasm of ascending colon    Diverticulosis of large intestine without diverticulitis    Erectile dysfunction due to arterial insufficiency 02/18/2018   Degenerative disc disease, lumbar 08/26/2016   Bilateral hip pain 06/24/2016   Chronic midline low back pain 06/24/2016   Polyarthralgia 06/24/2016   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   Hematospermia 05/03/2015   Colon polyps 08/16/2013   Hematuria 08/16/2013   Hyperlipidemia 08/16/2013   Incomplete emptying of bladder 06/23/2013   Benign prostatic hyperplasia with incomplete bladder emptying 11/11/2011   Elevated PSA 11/11/2011   Reduced libido 11/11/2011    ONSET DATE: 07/20/2023 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, right PCA occlusion   REFERRING DIAG: I63.50 (ICD-10-CM) - Cerebral infarction due to unspecified occlusion or stenosis of unspecified  cerebral artery   THERAPY DIAG:  Abnormality of gait  Other disturbances of skin sensation  Unsteadiness on feet  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   States that he has been approved for pain management injection, by insurance, and will be calling MD office after PT to set up appointment to receive injection.  From Initial Eval: Pt states he has hx of sciatica in L LE. Pt states recent CVA affected his L side and he is having numbness in L UE and LE. Pt states the CVA affected him from his jaw down through his L arm and L LE. Pt states he is left hand dominant. Pt states he was walking ~39miles daily and performing all household chores prior to CVA. Patient states he was very active and completely independent prior.   Pt states he had MRI yesterday for L LE sciatica and is going to see MD tomorrow to follow-up on results (with [facility]). Patient reporting current pain as 6/10 with it being greater this morning, but he took pain medication to ease it off prior to coming to therapy.   Pt accompanied by: significant other, Wife, [name]  PERTINENT HISTORY: PMH of Hyperlipidemia, BPH, HTN, hx of basal cell carcinoma, hx of squamous cell carcinoma, arthritis, R and L reverse shoulder arthroplasties (R in 2024 and L in 2017)  PAIN:  Are you having pain? Yes: NPRS scale: 6/10 after taking pain medication this AM Pain location: L LE sciatica Pain description: severe ache Aggravating factors: he can wake up with it, no specific aggravating factors Relieving factors: pt reports having to take pain medication (hydrocodone ) this AM, hx of having injections and prednisone ; uses horse liniment   PRECAUTIONS: Fall  RED FLAGS: None   WEIGHT BEARING RESTRICTIONS: No  FALLS: Has patient fallen in last 6 months? No and but states had a few stumbles getting his feet tangled up  LIVING ENVIRONMENT: Lives with: lives with their spouse Lives  in: House/apartment Stairs: Yes: External: 3+1 steps; on right going up; 1 level house Has following equipment at home: [employer] - 2 wheeled and built-in shower seat  PLOF: Independent, Independent with household mobility without device, Independent with homemaking with ambulation, Independent with gait, and Leisure: golf  PATIENT GOALS: get back to half normal being able to do yard work, wash a car, and golf  OBJECTIVE:  Note: Objective measures were completed at Evaluation unless otherwise noted.  DIAGNOSTIC FINDINGS:  EXAM: MRI HEAD WITHOUT CONTRAST   TECHNIQUE: Multiplanar, multiecho pulse sequences of the brain and surrounding structures were obtained without intravenous contrast.   COMPARISON:  CT head, CTA, CTP yesterday.  Brain MRI 02/22/2008.  IMPRESSION: 1. Positive for Acute lacunar infarct of the lateral Right Thalamus, but also abnormal diffusion throughout the right hippocampal formation. Both are Right PCA territory, although superimposed seizure related changes of the right hippocampus would be difficult to exclude. No associated hemorrhage or mass effect.   2. 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 No other acute intracranial abnormality identified.   Electronically Signed   By: Marlise Simpers M.D.   On: 07/21/2023 05:51  CTA head:   1. 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 There is some reconstitution of enhancement within right posterior cerebral artery branches beginning at the distal P2 segment level. 2. 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 3. Atherosclerotic plaque within the intracranial internal carotid arteries with no more than mild stenosis.      Electronically Signed   By: Bascom Lily D.O.   On: 07/20/2023 19:32   COGNITION: Overall cognitive status: Within functional limits for tasks assessed   SENSATION: Light touch: Impaired  and basically absent Proprioception: Impaired  Can only feel deep pressure in L LE  COORDINATION: Incoordinated in L LE, likely due to sensory impairments  EDEMA:  Not formally assessed  MUSCLE TONE: Not formally assessed  MUSCLE LENGTH: Not formally assessed  DTRs:  Not formally assessed  POSTURE: rounded shoulders and forward head  LOWER EXTREMITY ROM:     Active  Right Eval  WFL/WNL throughout Left Eval  WFL/WNL throughout  Hip flexion    Hip extension    Hip abduction    Hip adduction    Hip internal rotation    Hip external rotation    Knee flexion    Knee extension    Ankle dorsiflexion    Ankle plantarflexion    Ankle inversion    Ankle eversion     (Blank rows = not tested)  LOWER EXTREMITY MMT:    MMT Right Eval Left Eval  Hip flexion 4+ 3+  Hip extension    Hip abduction    Hip adduction    Hip internal rotation    Hip external rotation    Knee flexion 4+ 4-  Knee extension 4+ 4+  Ankle dorsiflexion 4+ 3  Ankle plantarflexion 4+ 4-  Ankle inversion    Ankle eversion    (Blank rows = not tested)  Manual Muscle Test Scale 0/5 = No muscle contraction can be seen or felt 1/5 = Contraction can be felt, but there is no motion 2-/5 = Part moves through incomplete ROM w/ gravity decreased 2/5 = Part moves through complete ROM w/ gravity decreased 2+/5 = Part moves through incomplete ROM (<50%) against gravity or through complete ROM w/ gravity 3-/5 = Part moves through  incomplete ROM (>50%) against gravity 3/5 = Part moves through complete ROM against gravity 3+/5 = Part moves through complete ROM against gravity/slight resistance 4-/5= Holds test position against slight to moderate pressure 4/5 = Part moves through complete ROM against  gravity/moderate resistance 4+/5= Holds test position against moderate to strong pressure 5/5 = Part moves through complete ROM against gravity/full resistance  BED MOBILITY:  Not tested, pt denies difficulty with this  TRANSFERS: Sit to stand: SBA and CGA  Assistive device utilized: Environmental consultant - 2 wheeled and None     Stand to sit: SBA and CGA  Assistive device utilized: Environmental consultant - 2 wheeled and None     Chair to chair: SBA and CGA  Assistive device utilized: Environmental consultant - 2 wheeled and None      *requires CGA when not using AD* RAMP:  Not tested  CURB:  Not tested  STAIRS: Findings: Level of Assistance: CGA and Min A, Stair Negotiation Technique: Step to Pattern Forwards with Single Rail on Right, Number of Stairs: 4, Height of Stairs: 6in   , and Comments: incoordinated, but no greater than min A for balance; only used R HR to simulate home environment GAIT: Findings: Gait Characteristics: step through pattern, decreased arm swing- Right, decreased arm swing- Left, decreased step length- Right, decreased step length- Left, decreased stride length, and wide BOS, Distance walked: 60ft + 342ft, Assistive device utilized:None, Level of assistance: CGA and Min A, and Comments: pt uses RW for all ambulation at home, but didn't use AD during session to assess patient's gait without  FUNCTIONAL TESTS:  5 times sit to stand: 15.12 seconds with arms across chest  6 minute walk test: need to assess 10 meter walk test: 0.93 m/s without AD with CGA for safety  Berg Balance Scale: need to assess Functional gait assessment: need to assess   PATIENT SURVEYS:  Stroke Impact Scale 35/80                                                                                                                              TREATMENT DATE: 07/30/2023 TA:  NUstpe level 1-5 x 6 min with varied resistance. Cues for consistent SPM and step length on BLE  through varied resistance.   PT applied gait belt and provided CGA  assist for all dynamic balance training with min cues for step length intermittently to reduce fall risk.   Sit<>stand without UE support x 10  Sit<>stand with 15# kettle bell x 10.  Box step aroung Y in floor x 10 CW, x 10 CCW.  Side stepping in parallel bars x 5 then performed stepping over bolsters x 5 bil  Forward overall bolsters x 4 laps.  Forward/reverse with step to reverse x 4 and reciprocal pattern x 4. UE support required for full step length on the LLE with reciprocal pattern  Standing on airex pad.  Normal BOS x 30sec 1 foot on 6inch step 2 x  20 sec bil  Reciprocal foot tap on 6 inch step x 12 bil  Foot tap to 1 of 3 targets on 6 inch step x 10 bil  Foot tap on 2 of 3 targets on 6 inch step x 8 per foot.   Cues for improved weight shift of the LLE with SLS on unlevel surface and improved posture to prevent posterior/L LOB.     PATIENT EDUCATION: Education details: Importance of performing L side skin assessments due to sensory deficits, checking hot/cold using R hand due to sensory deficits, findings of assessment today, exercises to perform at home until formal HEP developed Person educated: Patient and Spouse Education method: Explanation Education comprehension: verbalized understanding and needs further education  HOME EXERCISE PROGRAM:  Access Code: [email or web address] URL: [email or web address] Date: 08/06/2023 Prepared by: Marlynn Singer  Exercises - Standing March with Unilateral Counter Support  - 1 x daily - 7 x weekly - 2 sets - 10 reps - Sit to Stand Without Arm Support  - 1 x daily - 7 x weekly - 2 sets - 10 reps - Side Stepping with Counter Support  - 1 x daily - 7 x weekly - 3 sets - 10 reps  GOALS: Goals reviewed with patient? Yes  SHORT TERM GOALS: Target date: 09/10/2023  Patient will be independent in home exercise program to improve strength/mobility for better functional independence with ADLs and functional mobility.  Baseline: need to  initiate Goal status: INITIAL   LONG TERM GOALS: Target date: 10/22/2023  Patient (> 56 years old) will complete five times sit to stand test (5XSTS) in < 12 seconds indicating an increased LE strength and improved balance.  Baseline: 15.12 seconds with arms across chest Goal status: INITIAL  2.  Patient will increase Berg Balance score to > 51/56 to demonstrate improved balance and decreased fall risk during functional activities and ADLs.  Baseline: 38/56 Goal status: INITIAL  3.  Patient will increase six minute walk test ( ) distance to >1048ft for progression to community ambulator and improve gait ability  Baseline: 1,027 feet (313 meters, Avg speed 0.827m/s), no AD, with CGA and occasional min A Goal status: INITIAL  4.  Patient will increase 10 meter walk test to >1.70m/s as to improve gait speed for better community ambulation and to reduce fall risk. Baseline: 0.93 m/s without AD with CGA  Goal status: INITIAL  5.  Patient will increase Functional Gait Assessment (FGA) score to >20/30 as to reduce fall risk and improve dynamic gait safety with community ambulation.  Baseline: 5/30 Goal status: INITIAL  6.  Patient will improve Stroke Impact Scale-16 by >9 points indicating patient experiencing increased independence with functional mobility tasks and ADLs to improve quality of life and decrease caregiver burden.  Baseline: 35/80 Goal status: INITIAL  ASSESSMENT:  CLINICAL IMPRESSION:  Patient arrived with good motivation for completion of pt activities. Continued with current plan of care as laid out in evaluation and recent prior sessions. No pain in low back at start of PT session. PT treatment focused on dynamic balance to challenge functional movement patterns in various planes of movement. Mild L lateral LOB on airex pad, but improved with increased repetitions.  Pt will continue to benefit from skilled physical therapy intervention to address impairments, improve  QOL, and attain therapy goals.    OBJECTIVE IMPAIRMENTS: Abnormal gait, decreased activity tolerance, decreased balance, decreased coordination, decreased endurance, decreased knowledge of condition, decreased knowledge of use of DME, decreased mobility, difficulty walking, decreased strength,  increased fascial restrictions, impaired flexibility, impaired sensation, impaired UE functional use, improper body mechanics, and pain.   ACTIVITY LIMITATIONS: carrying, lifting, bending, sitting, standing, squatting, stairs, transfers, bathing, toileting, dressing, reach over head, hygiene/grooming, and locomotion level  PARTICIPATION LIMITATIONS: meal prep, cleaning, laundry, interpersonal relationship, driving, shopping, community activity, and yard work  PERSONAL FACTORS: Age, Fitness, Time since onset of injury/illness/exacerbation, and 3+ comorbidities: Hyperlipidemia, BPH, HTN, hx of basal cell carcinoma, hx of squamous cell carcinoma, arthritis, R and L reverse shoulder arthroplasties (R in 2024 and L in 2017) are also affecting patient's functional outcome.   REHAB POTENTIAL: Excellent  CLINICAL DECISION MAKING: Evolving/moderate complexity  EVALUATION COMPLEXITY: Moderate  PLAN:  PT FREQUENCY: 1-2x/week  PT DURATION: 12 weeks  PLANNED INTERVENTIONS: [id number]- PT Re-evaluation, 97750- Physical Performance Testing, 97110-Therapeutic exercises, 97530- Therapeutic activity, 97112- Neuromuscular re-education, 97535- Self Care, 91478- Manual therapy, [phone]- Gait training, [phone]- Orthotic Initial, [phone]- Orthotic/Prosthetic subsequent, [phone]- Canalith repositioning, [phone]- Electrical stimulation (manual), Patient/Family education, Balance training, Stair training, Taping, Dry Needling, Joint mobilization, Joint manipulation, Spinal mobilization, Vestibular training, Visual/preceptual remediation/compensation, DME instructions, Cryotherapy, Moist heat, and Biofeedback  PLAN FOR NEXT SESSION:  Continue  POC  - participate in higher level dynamic balance and gait challenges including:  - horizontal and vertical head turns  - stepping towards external targets due to LLE incoordination  - gait in variable directions   Barbara Book PT ,DPT Physical Therapist- [facility]  [facility]   9:24 AM 08/21/23

## 2023-08-24 ENCOUNTER — Ambulatory Visit: Admitting: Physical Therapy

## 2023-08-24 DIAGNOSIS — R208 Other disturbances of skin sensation: Secondary | ICD-10-CM

## 2023-08-24 DIAGNOSIS — R269 Unspecified abnormalities of gait and mobility: Secondary | ICD-10-CM | POA: Diagnosis not present

## 2023-08-24 DIAGNOSIS — R2681 Unsteadiness on feet: Secondary | ICD-10-CM

## 2023-08-24 DIAGNOSIS — M5432 Sciatica, left side: Secondary | ICD-10-CM

## 2023-08-24 DIAGNOSIS — M6281 Muscle weakness (generalized): Secondary | ICD-10-CM

## 2023-08-24 DIAGNOSIS — I69352 Hemiplegia and hemiparesis following cerebral infarction affecting left dominant side: Secondary | ICD-10-CM

## 2023-08-24 NOTE — Therapy (Signed)
 OUTPATIENT PHYSICAL THERAPY NEURO TREATMENT   Patient Name: George Bruce MRN: [id number] DOB:08/24/1945, 78 y.o., male Today's Date: 08/24/2023   PCP: George Helms, MD  REFERRING PROVIDER: Yehuda Helms, MD   END OF SESSION:   PT End of Session - 08/24/23 1148     Visit Number 8    Number of Visits 24    Date for PT Re-Evaluation 10/22/23    Progress Note Due on Visit 10    PT Start Time 1150    PT Stop Time 1232    PT Time Calculation (min) 42 min    Equipment Utilized During Treatment Gait belt    Activity Tolerance Patient tolerated treatment well    Behavior During Therapy WFL for tasks assessed/performed          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   Hypertension    Wears dentures    partial upper and lower   Past Surgical History:  Procedure Laterality Date   CATARACT EXTRACTION W/PHACO Right 09/29/2022   Procedure: CATARACT EXTRACTION PHACO AND INTRAOCULAR LENS PLACEMENT (IOC) RIGHT  10.98  00:56.5;  Surgeon: Rosa College, MD;  Location: [facility];  Service: Ophthalmology;  Laterality: Right;   CATARACT EXTRACTION W/PHACO Left 10/13/2022   Procedure: CATARACT EXTRACTION PHACO AND INTRAOCULAR LENS PLACEMENT (IOC) LEFT 10.79 00:54.1;  Surgeon: Rosa College, MD;  Location: [facility];  Service: Ophthalmology;  Laterality: Left;   COLONOSCOPY WITH PROPOFOL  N/A 04/21/2018   Procedure: COLONOSCOPY WITH PROPOFOL ;  Surgeon: Irby Mannan, MD;  Location: [facility];  Service: Endoscopy;  Laterality: N/A;   INGUINAL HERNIA REPAIR Left 1990's   INGUINAL HERNIA REPAIR Right 2015   REVERSE SHOULDER ARTHROPLASTY Left 11/06/2015   Procedure: REVERSE SHOULDER ARTHROPLASTY;  Surgeon: Elner Hahn, MD;   Location: [facility];  Service: Orthopedics;  Laterality: Left;   REVERSE SHOULDER ARTHROPLASTY Right 03/25/2022   Procedure: REVERSE SHOULDER ARTHROPLASTY;  Surgeon: Elner Hahn, MD;  Location: [facility];  Service: Orthopedics;  Laterality: Right;  MAKE 2ND CASE   Patient Active Problem List   Diagnosis Date Noted   Acute CVA (cerebrovascular accident) (HCC) 07/21/2023   Elevated blood pressure reading 07/21/2023   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   Acute diverticulitis 05/07/2018   History of colonic polyps    Benign neoplasm of ascending colon    Diverticulosis of large intestine without diverticulitis    Erectile dysfunction due to arterial insufficiency 02/18/2018   Degenerative disc disease, lumbar 08/26/2016   Bilateral hip pain 06/24/2016   Chronic midline low back pain 06/24/2016   Polyarthralgia 06/24/2016   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   Hematospermia 05/03/2015   Colon polyps 08/16/2013   Hematuria 08/16/2013   Hyperlipidemia 08/16/2013   Incomplete emptying of bladder 06/23/2013   Benign prostatic hyperplasia with incomplete bladder emptying 11/11/2011   Elevated PSA 11/11/2011   Reduced libido 11/11/2011    ONSET DATE: 07/20/2023 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, right PCA occlusion   REFERRING DIAG: I63.50 (ICD-10-CM) - Cerebral infarction due to unspecified occlusion or stenosis of unspecified cerebral artery  THERAPY DIAG:  Abnormality of gait  Other disturbances of skin sensation  Unsteadiness on feet  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 States it just gets to jumping. Reports he isn't sure if it is the nerve doing it or not. States he has an appointment with Dr. Claudius Bruce, his PCP, on Wednesday at 11:00AM and plans to talk with him about this.   Pt reports he may be going to see a neuropsychologist vs psychologist next week.   Denies pain currently, states he took tramadol this morning at 6AM. (Takes it 2x/day at 6AM and 6PM) Pt states without that medication he can hardly stand up even just to use the bathroom. Pt states he was on hydrocodone , but the tramadol is now able to manage his pain successfully.  Reports he was supposed to get an injection after the MRI through [facility], but now he is on blood thinners so the injection has been postponed.   From Initial Eval: Pt states he has hx of sciatica in L LE. Pt states recent CVA affected his L side and he is having numbness in L UE and LE. Pt states the CVA affected him from his jaw down through his L arm and L LE. Pt states he is left hand dominant. Pt states he was walking ~70miles daily and performing all household chores prior to CVA. Patient states he was very active and completely independent prior.   Pt states he had MRI yesterday for L LE sciatica and is going to see MD tomorrow to follow-up on results (with [facility]). Patient reporting current pain as 6/10 with it being greater this morning, but he took pain medication to ease it off prior to coming to therapy.   Pt accompanied by: significant other, Wife, George Bruce  PERTINENT HISTORY: PMH of Hyperlipidemia, BPH, HTN, hx of basal cell carcinoma, hx of squamous cell carcinoma, arthritis, R and L reverse shoulder arthroplasties (R in 2024 and L in 2017)  PAIN:  Are you having pain? Yes: NPRS scale: 6/10 after taking pain medication this AM Pain location: L LE sciatica Pain description: severe  ache Aggravating factors: he can wake up with it, no specific aggravating factors Relieving  factors: pt reports having to take pain medication (hydrocodone ) this AM, hx of having injections and prednisone ; uses horse liniment   PRECAUTIONS: Fall  RED FLAGS: None   WEIGHT BEARING RESTRICTIONS: No  FALLS: Has patient fallen in last 6 months? No and but states had a few stumbles getting his feet tangled up  LIVING ENVIRONMENT: Lives with: lives with their spouse Lives in: House/apartment Stairs: Yes: External: 3+1 steps; on right going up; 1 level house Has following equipment at home: Walker - 2 wheeled and built-in shower seat  PLOF: Independent, Independent with household mobility without device, Independent with homemaking with ambulation, Independent with gait, and Leisure: golf  PATIENT GOALS: get back to half normal being able to do yard work, wash a car, and golf  OBJECTIVE:  Note: Objective measures were completed at Evaluation unless otherwise noted.  DIAGNOSTIC FINDINGS:  EXAM: MRI HEAD WITHOUT CONTRAST   TECHNIQUE: Multiplanar, multiecho pulse sequences of the brain and surrounding structures were obtained without intravenous contrast.   COMPARISON:  CT head, CTA, CTP yesterday.  Brain MRI 02/22/2008.  IMPRESSION: 1. Positive for Acute lacunar infarct of the lateral Right Thalamus, but also abnormal diffusion throughout the right hippocampal formation. Both are Right PCA territory, although superimposed seizure related changes of the right hippocampus would be difficult to exclude. No associated hemorrhage or mass effect.   2. 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 No other acute intracranial abnormality identified.   Electronically Signed   By: Marlise Simpers M.D.   On: 07/21/2023 05:51  CTA head:   1. 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 There is some reconstitution of enhancement within right posterior cerebral artery branches beginning at the distal P2 segment level. 2. 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 3. Atherosclerotic plaque within the intracranial internal carotid arteries with no more than mild stenosis.     Electronically Signed   By: Bascom Lily D.O.   On: 07/20/2023 19:32   COGNITION: Overall cognitive status: Within functional limits for tasks assessed   SENSATION: Light touch: Impaired  and basically absent Proprioception: Impaired  Can only feel deep pressure in L LE  COORDINATION: Incoordinated in L LE, likely due to sensory impairments  EDEMA:  Not formally assessed  MUSCLE TONE: Not formally assessed  MUSCLE LENGTH: Not formally assessed  DTRs:  Not formally assessed  POSTURE: rounded shoulders and forward head  LOWER EXTREMITY ROM:     Active  Right Eval  WFL/WNL throughout Left Eval  WFL/WNL throughout  Hip flexion    Hip extension    Hip abduction    Hip adduction    Hip internal rotation    Hip external rotation    Knee flexion    Knee extension    Ankle dorsiflexion    Ankle plantarflexion    Ankle inversion    Ankle eversion     (Blank rows = not tested)  LOWER EXTREMITY MMT:    MMT Right Eval Left Eval  Hip flexion 4+ 3+  Hip extension    Hip abduction    Hip adduction    Hip internal rotation    Hip external rotation    Knee flexion 4+ 4-  Knee extension 4+  4+  Ankle dorsiflexion 4+ 3  Ankle plantarflexion 4+ 4-  Ankle inversion    Ankle eversion    (Blank rows = not tested)  Manual Muscle Test Scale 0/5 = No muscle contraction can be seen or felt 1/5 = Contraction can be felt, but there is no motion 2-/5 = Part moves through incomplete ROM w/ gravity decreased 2/5  = Part moves through complete ROM w/ gravity decreased 2+/5 = Part moves through incomplete ROM (<50%) against gravity or through complete ROM w/ gravity 3-/5 = Part moves through incomplete ROM (>50%) against gravity 3/5 = Part moves through complete ROM against gravity 3+/5 = Part moves through complete ROM against gravity/slight resistance 4-/5= Holds test position against slight to moderate pressure 4/5 = Part moves through complete ROM against gravity/moderate resistance 4+/5= Holds test position against moderate to strong pressure 5/5 = Part moves through complete ROM against gravity/full resistance  BED MOBILITY:  Not tested, pt denies difficulty with this  TRANSFERS: Sit to stand: SBA and CGA  Assistive device utilized: Environmental consultant - 2 wheeled and None     Stand to sit: SBA and CGA  Assistive device utilized: Environmental consultant - 2 wheeled and None     Chair to chair: SBA and CGA  Assistive device utilized: Environmental consultant - 2 wheeled and None      *requires CGA when not using AD* RAMP:  Not tested  CURB:  Not tested  STAIRS: Findings: Level of Assistance: CGA and Min A, Stair Negotiation Technique: Step to Pattern Forwards with Single Rail on Right, Number of Stairs: 4, Height of Stairs: 6in   , and Comments: incoordinated, but no greater than min A for balance; only used R HR to simulate home environment GAIT: Findings: Gait Characteristics: step through pattern, decreased arm swing- Right, decreased arm swing- Left, decreased step length- Right, decreased step length- Left, decreased stride length, and wide BOS, Distance walked: 42ft + 344ft, Assistive device utilized:None, Level of assistance: CGA and Min A, and Comments: pt uses RW for all ambulation at home, but didn't use AD during session to assess patient's gait without  FUNCTIONAL TESTS:  5 times sit to stand: 15.12 seconds with arms across chest  6 minute walk test: need to assess 10 meter walk test: 0.93 m/s without AD with CGA for safety   Berg Balance Scale: need to assess Functional gait assessment: need to assess   PATIENT SURVEYS:  Stroke Impact Scale 35/80                                                                                                                              TREATMENT DATE: 08/24/2023  Unless otherwise stated, CGA was provided and gait belt donned in order to ensure pt safety throughout session.  Gait into clinic using SPC with close SBA - pt with increased restlessness and fidgeting and pt reporting feeling like his L LE is shaking. Pt reports he doesn't use an  AD around the house and only uses SPC out in community.  B UE and B LE reciprocal movement pattern on Nustep for cardiovascular training and B LE functional strengthening against level 3 resistance for 3 minutes, increased to level 4 for 1 minute, then decreased to level 2 for 1 minute, totaling 5 minutes and 344 steps, maintaining SPM >60 Pt reports L shoulder pain initially but it improves with repetition Pt reports really feeling his B LE muscles working with this exercise today   Sit<>stand with 15# kettle bell 2 x 10 (seated break between)  Dynamic gait training using airex balance beam:  Forward walking - down/back x3 Progressed to lateral tapping of foot on hedgehog on therapist's command (6 targets, 3 on each side of the beam) Progressed to forward and backwards walking with the foot taps Requires CGA/min A to maintain balance throughout Side stepping - down/back 3 reps Progressed to forward/backwards tapping to same targets as above Pt with fairly consistent posterior lean/LOB requiring min A to maintain upright *Noticed L ankle instability with inversion bias throughout, but no ankle rolling occurred   Dynamic gait training using agility ladder including the following:  - forward reciprocal pattern down/back x1 - side stepping down/back  x1 - 2 feet in/2 feet out zig-zag pattern down/back x2 reps  - requires max cuing  progressed to none for proper pattern/sequence *no significant balance instability only requiring CGA for steadying and pt with adequate speed on the stepping patterns   PATIENT EDUCATION: Education details: Importance of performing L side skin assessments due to sensory deficits, checking hot/cold using R hand due to sensory deficits, findings of assessment today, exercises to perform at home until formal HEP developed Person educated: Patient and Spouse Education method: Explanation Education comprehension: verbalized understanding and needs further education  HOME EXERCISE PROGRAM:  Access Code: [email or web address] URL: [email or web address] Date: 08/06/2023 Prepared by: Marlynn Singer  Exercises - Standing March with Unilateral Counter Support  - 1 x daily - 7 x weekly - 2 sets - 10 reps - Sit to Stand Without Arm Support  - 1 x daily - 7 x weekly - 2 sets - 10 reps - Side Stepping with Counter Support  - 1 x daily - 7 x weekly - 3 sets - 10 reps   GOALS: Goals reviewed with patient? Yes  SHORT TERM GOALS: Target date: 09/10/2023  Patient will be independent in home exercise program to improve strength/mobility for better functional independence with ADLs and functional mobility.  Baseline: need to initiate Goal status: INITIAL   LONG TERM GOALS: Target date: 10/22/2023  Patient (> 22 years old) will complete five times sit to stand test (5XSTS) in < 12 seconds indicating an increased LE strength and improved balance.  Baseline: 15.12 seconds with arms across chest Goal status: INITIAL  2.  Patient will increase Berg Balance score to > 51/56 to demonstrate improved balance and decreased fall risk during functional activities and ADLs.  Baseline: 38/56 Goal status: INITIAL  3.  Patient will increase six minute walk test ( ) distance to >1046ft for progression to community ambulator and improve gait ability  Baseline: 1,027 feet (313 meters, Avg speed 0.824m/s), no  AD, with CGA and occasional min A Goal status: INITIAL  4.  Patient will increase 10 meter walk test to >1.17m/s as to improve gait speed for better community ambulation and to reduce fall risk. Baseline: 0.93 m/s without AD with CGA  Goal status: INITIAL  5.  Patient will increase  Functional Gait Assessment (FGA) score to >20/30 as to reduce fall risk and improve dynamic gait safety with community ambulation.  Baseline: 5/30 Goal status: INITIAL  6.  Patient will improve Stroke Impact Scale-16 by >9 points indicating patient experiencing increased independence with functional mobility tasks and ADLs to improve quality of life and decrease caregiver burden.  Baseline: 35/80 Goal status: INITIAL  ASSESSMENT:  CLINICAL IMPRESSION:  Patient arrived with good motivation for completion of therapy session. Pt denying L LE pain to start session, stating the medication is managing his pain. PT treatment focused on dynamic balance to challenge functional movement patterns in various planes/directions. Patient greatly challenged with stepping along airex balance beam, requiring min A to maintain balance, and having L ankle instability impacting balance. Patient also participated in dynamic stepping balance challenges using agility ladder with pt having good speed of movements without noticeable instability. Pt will continue to benefit from skilled physical therapy interventions to address impairments, improve QOL, and attain therapy goals.    OBJECTIVE IMPAIRMENTS: Abnormal gait, decreased activity tolerance, decreased balance, decreased coordination, decreased endurance, decreased knowledge of condition, decreased knowledge of use of DME, decreased mobility, difficulty walking, decreased strength, increased fascial restrictions, impaired flexibility, impaired sensation, impaired UE functional use, improper body mechanics, and pain.   ACTIVITY LIMITATIONS: carrying, lifting, bending, sitting, standing,  squatting, stairs, transfers, bathing, toileting, dressing, reach over head, hygiene/grooming, and locomotion level  PARTICIPATION LIMITATIONS: meal prep, cleaning, laundry, interpersonal relationship, driving, shopping, community activity, and yard work  PERSONAL FACTORS: Age, Fitness, Time since onset of injury/illness/exacerbation, and 3+ comorbidities: Hyperlipidemia, BPH, HTN, hx of basal cell carcinoma, hx of squamous cell carcinoma, arthritis, R and L reverse shoulder arthroplasties (R in 2024 and L in 2017) are also affecting patient's functional outcome.   REHAB POTENTIAL: Excellent  CLINICAL DECISION MAKING: Evolving/moderate complexity  EVALUATION COMPLEXITY: Moderate  PLAN:  PT FREQUENCY: 1-2x/week  PT DURATION: 12 weeks  PLANNED INTERVENTIONS: 97164- PT Re-evaluation, 97750- Physical Performance Testing, 97110-Therapeutic exercises, 97530- Therapeutic activity, [id number]- Neuromuscular re-education, 97535- Self Care, 62831- Manual therapy, [id number]- Gait training, [phone]- Orthotic Initial, [phone]- Orthotic/Prosthetic subsequent, [phone]- Canalith repositioning, [phone]- Electrical stimulation (manual), Patient/Family education, Balance training, Stair training, Taping, Dry Needling, Joint mobilization, Joint manipulation, Spinal mobilization, Vestibular training, Visual/preceptual remediation/compensation, DME instructions, Cryotherapy, Moist heat, and Biofeedback  PLAN FOR NEXT SESSION: - participate in higher level dynamic balance and gait challenges including:  - horizontal and vertical head turns  - stepping towards external targets due to LLE incoordination  - gait in variable directions  - unstable surfaces - progress to practicing golf swing for return to recreational activities    Carlen Chasten, PT, DPT, NCS, CSRS Physical Therapist - [facility]  [facility]  12:35 PM 08/24/23

## 2023-08-27 NOTE — Therapy (Signed)
 OUTPATIENT PHYSICAL THERAPY NEURO TREATMENT   Patient Name: George Bruce MRN: [id number] DOB:08-10-1945, 78 y.o., male Today's Date: 08/28/2023   PCP: George Helms, MD  REFERRING PROVIDER: Yehuda Helms, MD   END OF SESSION:   PT End of Session - 08/28/23 0847     Visit Number 9    Number of Visits 24    Date for PT Re-Evaluation 10/22/23    Progress Note Due on Visit 10    PT Start Time 0847    PT Stop Time 0932    PT Time Calculation (min) 45 min    Equipment Utilized During Treatment Gait belt    Activity Tolerance Patient tolerated treatment well    Behavior During Therapy WFL for tasks assessed/performed           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   Hypertension    Wears dentures    partial upper and lower   Past Surgical History:  Procedure Laterality Date   CATARACT EXTRACTION W/PHACO Right 09/29/2022   Procedure: CATARACT EXTRACTION PHACO AND INTRAOCULAR LENS PLACEMENT (IOC) RIGHT  10.98  00:56.5;  Surgeon: George College, MD;  Location: [facility];  Service: Ophthalmology;  Laterality: Right;   CATARACT EXTRACTION W/PHACO Left 10/13/2022   Procedure: CATARACT EXTRACTION PHACO AND INTRAOCULAR LENS PLACEMENT (IOC) LEFT 10.79 00:54.1;  Surgeon: George College, MD;  Location: [facility];  Service: Ophthalmology;  Laterality: Left;   COLONOSCOPY WITH PROPOFOL  N/A 04/21/2018   Procedure: COLONOSCOPY WITH PROPOFOL ;  Surgeon: George Mannan, MD;  Location: [facility];  Service: Endoscopy;  Laterality: N/A;   INGUINAL HERNIA REPAIR Left 1990's   INGUINAL HERNIA REPAIR Right 2015   REVERSE SHOULDER ARTHROPLASTY Left 11/06/2015   Procedure: REVERSE SHOULDER ARTHROPLASTY;  Surgeon: George Hahn,  MD;  Location: [facility];  Service: Orthopedics;  Laterality: Left;   REVERSE SHOULDER ARTHROPLASTY Right 03/25/2022   Procedure: REVERSE SHOULDER ARTHROPLASTY;  Surgeon: George Hahn, MD;  Location: [facility];  Service: Orthopedics;  Laterality: Right;  MAKE 2ND CASE   Patient Active Problem List   Diagnosis Date Noted   Acute CVA (cerebrovascular accident) (HCC) 07/21/2023   Elevated blood pressure reading 07/21/2023   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   Acute diverticulitis 05/07/2018   History of colonic polyps    Benign neoplasm of ascending colon    Diverticulosis of large intestine without diverticulitis    Erectile dysfunction due to arterial insufficiency 02/18/2018   Degenerative disc disease, lumbar 08/26/2016   Bilateral hip pain 06/24/2016   Chronic midline low back pain 06/24/2016   Polyarthralgia 06/24/2016   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   Hematospermia 05/03/2015   Colon polyps 08/16/2013   Hematuria 08/16/2013   Hyperlipidemia 08/16/2013   Incomplete emptying of bladder 06/23/2013   Benign prostatic hyperplasia with incomplete bladder emptying 11/11/2011   Elevated PSA 11/11/2011   Reduced libido 11/11/2011    ONSET DATE: 07/20/2023 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, right PCA occlusion   REFERRING DIAG: I63.50 (ICD-10-CM) - Cerebral infarction due to unspecified occlusion or stenosis of unspecified cerebral artery  THERAPY DIAG:  Abnormality of gait  Other disturbances of skin sensation  Unsteadiness on feet  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 States it just gets to jumping. Reports he isn't sure if it is the nerve doing it or not. States he has an appointment with Dr. Claudius Bruce, his PCP, on Wednesday at 11:00AM and plans to talk with him about this.   Pt reports he may be going to see a neuropsychologist vs psychologist next week.   Denies pain currently, states he took tramadol this morning at 6AM. (Takes it 2x/day at 6AM and 6PM) Pt states without that medication he can hardly stand up even just to use the bathroom. Pt states he was on hydrocodone , but the tramadol is now able to manage his pain successfully.  Reports he was supposed to get an injection after the MRI through [facility], but now he is on blood thinners so the injection has been postponed.   From Initial Eval: Pt states he has hx of sciatica in L LE. Pt states recent CVA affected his L side and he is having numbness in L UE and LE. Pt states the CVA affected him from his jaw down through his L arm and L LE. Pt states he is left hand dominant. Pt states he was walking ~93miles daily and performing all household chores prior to CVA. Patient states he was very active and completely independent prior.   Pt states he had MRI yesterday for L LE sciatica and is going to see MD tomorrow to follow-up on results (with [facility]). Patient reporting current pain as 6/10 with it being greater this morning, but he took pain medication to ease it off prior to coming to therapy.   Pt accompanied by: significant other, Wife, George Bruce  PERTINENT HISTORY: PMH of Hyperlipidemia, BPH, HTN, hx of basal cell carcinoma, hx of squamous cell carcinoma, arthritis, R and L reverse shoulder arthroplasties (R in 2024 and L in 2017)  PAIN:  Are you having pain? Yes: NPRS scale: 6/10 after taking pain medication this AM Pain location: L LE sciatica Pain description: severe ache Aggravating  factors: he can wake up with it, no specific aggravating factors Relieving factors: pt reports having  to take pain medication (hydrocodone ) this AM, hx of having injections and prednisone ; uses horse liniment   PRECAUTIONS: Fall  RED FLAGS: None   WEIGHT BEARING RESTRICTIONS: No  FALLS: Has patient fallen in last 6 months? No and but states had a few stumbles getting his feet tangled up  LIVING ENVIRONMENT: Lives with: lives with their spouse Lives in: House/apartment Stairs: Yes: External: 3+1 steps; on right going up; 1 level house Has following equipment at home: Walker - 2 wheeled and built-in shower seat  PLOF: Independent, Independent with household mobility without device, Independent with homemaking with ambulation, Independent with gait, and Leisure: golf  PATIENT GOALS: get back to half normal being able to do yard work, wash a car, and golf  OBJECTIVE:  Note: Objective measures were completed at Evaluation unless otherwise noted.  DIAGNOSTIC FINDINGS:  EXAM: MRI HEAD WITHOUT CONTRAST   TECHNIQUE: Multiplanar, multiecho pulse sequences of the brain and surrounding structures were obtained without intravenous contrast.   COMPARISON:  CT head, CTA, CTP yesterday.  Brain MRI 02/22/2008.  IMPRESSION: 1. Positive for Acute lacunar infarct of the lateral Right Thalamus, but also abnormal diffusion throughout the right hippocampal formation. Both are Right PCA territory, although superimposed seizure related changes of the right hippocampus would be difficult to exclude. No associated hemorrhage or mass effect.   2. 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 No other acute intracranial abnormality identified.   Electronically Signed   By: Marlise Simpers M.D.   On: 07/21/2023 05:51  CTA head:   1. 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 There is some reconstitution of enhancement within right posterior cerebral artery branches beginning at the distal P2 segment level. 2. 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 3. Atherosclerotic plaque within the intracranial internal carotid arteries with no more than mild stenosis.     Electronically Signed   By: Bascom Lily D.O.   On: 07/20/2023 19:32   COGNITION: Overall cognitive status: Within functional limits for tasks assessed   SENSATION: Light touch: Impaired  and basically absent Proprioception: Impaired  Can only feel deep pressure in L LE  COORDINATION: Incoordinated in L LE, likely due to sensory impairments  EDEMA:  Not formally assessed  MUSCLE TONE: Not formally assessed  MUSCLE LENGTH: Not formally assessed  DTRs:  Not formally assessed  POSTURE: rounded shoulders and forward head  LOWER EXTREMITY ROM:     Active  Right Eval  WFL/WNL throughout Left Eval  WFL/WNL throughout  Hip flexion    Hip extension    Hip abduction    Hip adduction    Hip internal rotation    Hip external rotation    Knee flexion    Knee extension    Ankle dorsiflexion    Ankle plantarflexion    Ankle inversion    Ankle eversion     (Blank rows = not tested)  LOWER EXTREMITY MMT:    MMT Right Eval Left Eval  Hip flexion 4+ 3+  Hip extension    Hip abduction    Hip adduction    Hip internal rotation    Hip external rotation    Knee flexion 4+ 4-  Knee extension 4+ 4+  Ankle dorsiflexion  4+ 3  Ankle plantarflexion 4+ 4-  Ankle inversion    Ankle eversion    (Blank rows = not tested)  Manual Muscle Test Scale 0/5 = No muscle contraction can be seen or felt 1/5 = Contraction can be felt, but there is no motion 2-/5 = Part moves through incomplete ROM w/ gravity decreased 2/5 = Part moves  through complete ROM w/ gravity decreased 2+/5 = Part moves through incomplete ROM (<50%) against gravity or through complete ROM w/ gravity 3-/5 = Part moves through incomplete ROM (>50%) against gravity 3/5 = Part moves through complete ROM against gravity 3+/5 = Part moves through complete ROM against gravity/slight resistance 4-/5= Holds test position against slight to moderate pressure 4/5 = Part moves through complete ROM against gravity/moderate resistance 4+/5= Holds test position against moderate to strong pressure 5/5 = Part moves through complete ROM against gravity/full resistance  BED MOBILITY:  Not tested, pt denies difficulty with this  TRANSFERS: Sit to stand: SBA and CGA  Assistive device utilized: Environmental consultant - 2 wheeled and None     Stand to sit: SBA and CGA  Assistive device utilized: Environmental consultant - 2 wheeled and None     Chair to chair: SBA and CGA  Assistive device utilized: Environmental consultant - 2 wheeled and None      *requires CGA when not using AD* RAMP:  Not tested  CURB:  Not tested  STAIRS: Findings: Level of Assistance: CGA and Min A, Stair Negotiation Technique: Step to Pattern Forwards with Single Rail on Right, Number of Stairs: 4, Height of Stairs: 6in   , and Comments: incoordinated, but no greater than min A for balance; only used R HR to simulate home environment GAIT: Findings: Gait Characteristics: step through pattern, decreased arm swing- Right, decreased arm swing- Left, decreased step length- Right, decreased step length- Left, decreased stride length, and wide BOS, Distance walked: 69ft + 342ft, Assistive device utilized:None, Level of assistance: CGA and Min A, and Comments: pt uses RW for all ambulation at home, but didn't use AD during session to assess patient's gait without  FUNCTIONAL TESTS:  5 times sit to stand: 15.12 seconds with arms across chest  6 minute walk test: need to assess 10 meter walk test: 0.93 m/s without AD with CGA for safety  Berg  Balance Scale: need to assess Functional gait assessment: need to assess   PATIENT SURVEYS:  Stroke Impact Scale 35/80                                                                                                                              TREATMENT DATE: 08/28/2023  Unless otherwise stated, CGA was provided and gait belt donned in order to ensure pt safety throughout session.  Gait into clinic using SPC with supervision- some increased LE shaking but not as much as previous session.  NMR:  Nustep- B UE and B LE reciprocal movement pattern for cardiovascular training and B  LE functional strengthening against level 3 resistance for 3 minutes, increased to level 4 for 1 minute, then decreased to level 2 for 1 minute,  and level 1 for 1 min totaling 6 min- total distance= 0.2 mi, maintaining SPM >60  Fwd lunge squat walk in // bars without UE support down and back x 5 Dynamic high knee march in // bars down and back x 5 Tandem gait down and back - x 6 (some reaching with UE for support) - unsteady  Retro gait in // bars  down and back x 5- VC to widen feet to prevent scissoring- VC to look up to avoid excessive forward flexed posture.  Forward step up/over 1/2 foam roll (3) in // bars focusing on increased step length. (Some initial unsteadiness- improved with reps)  Side step up/over 1/2 foam roll (3) in // bars focusing on wide steps- 1LOB initially requiring mod a to keep from falling- yet after that just CGA and no mis-steps.  SLS- multiple trials each side- only able to hold each leg around 3-6 sec each side (Very difficult)       PATIENT EDUCATION: Education details: Importance of performing L side skin assessments due to sensory deficits, checking hot/cold using R hand due to sensory deficits, findings of assessment today, exercises to perform at home until formal HEP developed Person educated: Patient and Spouse Education method: Explanation Education comprehension:  verbalized understanding and needs further education  HOME EXERCISE PROGRAM:  Access Code: [email or web address] URL: [email or web address] Date: 08/06/2023 Prepared by: Marlynn Singer  Exercises - Standing March with Unilateral Counter Support  - 1 x daily - 7 x weekly - 2 sets - 10 reps - Sit to Stand Without Arm Support  - 1 x daily - 7 x weekly - 2 sets - 10 reps - Side Stepping with Counter Support  - 1 x daily - 7 x weekly - 3 sets - 10 reps   GOALS: Goals reviewed with patient? Yes  SHORT TERM GOALS: Target date: 09/10/2023  Patient will be independent in home exercise program to improve strength/mobility for better functional independence with ADLs and functional mobility.  Baseline: need to initiate Goal status: INITIAL   LONG TERM GOALS: Target date: 10/22/2023  Patient (> 26 years old) will complete five times sit to stand test (5XSTS) in < 12 seconds indicating an increased LE strength and improved balance.  Baseline: 15.12 seconds with arms across chest Goal status: INITIAL  2.  Patient will increase Berg Balance score to > 51/56 to demonstrate improved balance and decreased fall risk during functional activities and ADLs.  Baseline: 38/56 Goal status: INITIAL  3.  Patient will increase six minute walk test ( ) distance to >101ft for progression to community ambulator and improve gait ability  Baseline: 1,027 feet (313 meters, Avg speed 0.84m/s), no AD, with CGA and occasional min A Goal status: INITIAL  4.  Patient will increase 10 meter walk test to >1.46m/s as to improve gait speed for better community ambulation and to reduce fall risk. Baseline: 0.93 m/s without AD with CGA  Goal status: INITIAL  5.  Patient will increase Functional Gait Assessment (FGA) score to >20/30 as to reduce fall risk and improve dynamic gait safety with community ambulation.  Baseline: 5/30 Goal status: INITIAL  6.  Patient will improve Stroke Impact Scale-16 by >9 points  indicating patient experiencing increased independence with functional mobility tasks and ADLs to improve quality of life and decrease caregiver burden.  Baseline: 35/80  Goal status: INITIAL  ASSESSMENT:  CLINICAL IMPRESSION:  Today's treatment continued to  focus on dynamic balance to challenge functional movement patterns in various planes/directions. He was challenged with narrowed base of support activities including tandem walking and later some imbalance with retro walking. He struggled mostly with Single leg standing at end of session and instructed to add tandem and SLS into his HEP.  Pt will continue to benefit from skilled physical therapy interventions to address impairments, improve QOL, and attain therapy goals.    OBJECTIVE IMPAIRMENTS: Abnormal gait, decreased activity tolerance, decreased balance, decreased coordination, decreased endurance, decreased knowledge of condition, decreased knowledge of use of DME, decreased mobility, difficulty walking, decreased strength, increased fascial restrictions, impaired flexibility, impaired sensation, impaired UE functional use, improper body mechanics, and pain.   ACTIVITY LIMITATIONS: carrying, lifting, bending, sitting, standing, squatting, stairs, transfers, bathing, toileting, dressing, reach over head, hygiene/grooming, and locomotion level  PARTICIPATION LIMITATIONS: meal prep, cleaning, laundry, interpersonal relationship, driving, shopping, community activity, and yard work  PERSONAL FACTORS: Age, Fitness, Time since onset of injury/illness/exacerbation, and 3+ comorbidities: Hyperlipidemia, BPH, HTN, hx of basal cell carcinoma, hx of squamous cell carcinoma, arthritis, R and L reverse shoulder arthroplasties (R in 2024 and L in 2017) are also affecting patient's functional outcome.   REHAB POTENTIAL: Excellent  CLINICAL DECISION MAKING: Evolving/moderate complexity  EVALUATION COMPLEXITY: Moderate  PLAN:  PT FREQUENCY:  1-2x/week  PT DURATION: 12 weeks  PLANNED INTERVENTIONS: 97164- PT Re-evaluation, 97750- Physical Performance Testing, 97110-Therapeutic exercises, 97530- Therapeutic activity, 97112- Neuromuscular re-education, 97535- Self Care, [id number]- Manual therapy, [phone]- Gait training, [phone]- Orthotic Initial, [phone]- Orthotic/Prosthetic subsequent, [phone]- Canalith repositioning, [phone]- Electrical stimulation (manual), Patient/Family education, Balance training, Stair training, Taping, Dry Needling, Joint mobilization, Joint manipulation, Spinal mobilization, Vestibular training, Visual/preceptual remediation/compensation, DME instructions, Cryotherapy, Moist heat, and Biofeedback  PLAN FOR NEXT SESSION: - participate in higher level dynamic balance and gait challenges including:  - horizontal and vertical head turns  - stepping towards external targets due to LLE incoordination  - gait in variable directions  - unstable surfaces - progress to practicing golf swing for return to recreational activities    Ossie Blend, PT Physical Therapist - [facility]  [facility]  10:33 AM 08/28/23

## 2023-08-28 ENCOUNTER — Ambulatory Visit

## 2023-08-28 DIAGNOSIS — M6281 Muscle weakness (generalized): Secondary | ICD-10-CM

## 2023-08-28 DIAGNOSIS — R2681 Unsteadiness on feet: Secondary | ICD-10-CM

## 2023-08-28 DIAGNOSIS — R208 Other disturbances of skin sensation: Secondary | ICD-10-CM

## 2023-08-28 DIAGNOSIS — R269 Unspecified abnormalities of gait and mobility: Secondary | ICD-10-CM

## 2023-08-28 DIAGNOSIS — I69352 Hemiplegia and hemiparesis following cerebral infarction affecting left dominant side: Secondary | ICD-10-CM

## 2023-09-01 ENCOUNTER — Ambulatory Visit: Admitting: Physical Therapy

## 2023-09-01 DIAGNOSIS — R269 Unspecified abnormalities of gait and mobility: Secondary | ICD-10-CM | POA: Diagnosis not present

## 2023-09-01 DIAGNOSIS — I69352 Hemiplegia and hemiparesis following cerebral infarction affecting left dominant side: Secondary | ICD-10-CM

## 2023-09-01 DIAGNOSIS — M6281 Muscle weakness (generalized): Secondary | ICD-10-CM

## 2023-09-01 DIAGNOSIS — R208 Other disturbances of skin sensation: Secondary | ICD-10-CM

## 2023-09-01 DIAGNOSIS — R2681 Unsteadiness on feet: Secondary | ICD-10-CM

## 2023-09-01 NOTE — Therapy (Signed)
 OUTPATIENT PHYSICAL THERAPY NEURO TREATMENT/ PHYSICAL THERAPY PROGRESS NOTE   Dates of reporting period  07/30/23   to   09/01/2023     Patient Name: George Bruce MRN: [id number] DOB:December 22, 1945, 78 y.o., male Today's Date: 09/01/2023   PCP: George Reyes BIRCH, MD  REFERRING PROVIDER: Auston Reyes BIRCH, MD   END OF SESSION:   PT End of Session - 09/01/23 1007     Visit Number 10    Number of Visits 24    Date for PT Re-Evaluation 10/22/23    Progress Note Due on Visit 10    PT Start Time 1020    PT Stop Time 1100    PT Time Calculation (min) 40 min    Equipment Utilized During Treatment Gait belt    Activity Tolerance Patient tolerated treatment well    Behavior During Therapy WFL for tasks assessed/performed           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   Hypertension    Wears dentures    partial upper and lower   Past Surgical History:  Procedure Laterality Date   CATARACT EXTRACTION W/PHACO Right 09/29/2022   Procedure: CATARACT EXTRACTION PHACO AND INTRAOCULAR LENS PLACEMENT (IOC) RIGHT  10.98  00:56.5;  Surgeon: George Adine Anes, MD;  Location: [facility];  Service: Ophthalmology;  Laterality: Right;   CATARACT EXTRACTION W/PHACO Left 10/13/2022   Procedure: CATARACT EXTRACTION PHACO AND INTRAOCULAR LENS PLACEMENT (IOC) LEFT 10.79 00:54.1;  Surgeon: George Adine Anes, MD;  Location: [facility];  Service: Ophthalmology;  Laterality: Left;   COLONOSCOPY WITH PROPOFOL  N/A 04/21/2018   Procedure: COLONOSCOPY WITH PROPOFOL ;  Surgeon: George Keene NOVAK, MD;  Location: [facility];  Service: Endoscopy;  Laterality: N/A;   INGUINAL HERNIA REPAIR Left 1990's   INGUINAL HERNIA REPAIR Right 2015   REVERSE SHOULDER  ARTHROPLASTY Left 11/06/2015   Procedure: REVERSE SHOULDER ARTHROPLASTY;  Surgeon: George Bruce Maltos, MD;  Location: [facility];  Service: Orthopedics;  Laterality: Left;   REVERSE SHOULDER ARTHROPLASTY Right 03/25/2022   Procedure: REVERSE SHOULDER ARTHROPLASTY;  Surgeon: Bruce George JINNY, MD;  Location: [facility];  Service: Orthopedics;  Laterality: Right;  MAKE 2ND CASE   Patient Active Problem List   Diagnosis Date Noted   Acute CVA (cerebrovascular accident) (HCC) 07/21/2023   Elevated blood pressure reading 07/21/2023   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   Acute diverticulitis 05/07/2018   History of colonic polyps    Benign neoplasm of ascending colon    Diverticulosis of large intestine without diverticulitis    Erectile dysfunction due to arterial insufficiency 02/18/2018   Degenerative disc disease, lumbar 08/26/2016   Bilateral hip pain 06/24/2016   Chronic midline low back pain 06/24/2016   Polyarthralgia 06/24/2016   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   Hematospermia 05/03/2015   Colon polyps 08/16/2013   Hematuria 08/16/2013   Hyperlipidemia 08/16/2013   Incomplete emptying of bladder 06/23/2013   Benign prostatic hyperplasia with incomplete bladder emptying 11/11/2011   Elevated PSA 11/11/2011   Reduced libido 11/11/2011    ONSET DATE: 07/20/2023 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, right PCA  occlusion   REFERRING DIAG: I63.50 (ICD-10-CM) - Cerebral infarction due to unspecified occlusion or stenosis of unspecified cerebral artery   THERAPY DIAG:  Abnormality of gait  Other disturbances of skin sensation  Unsteadiness on feet  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 Reports that PCP adjusted one of the medication.  Reports that he will be Denies pain currently, states he took tramadol this morning at 6AM. (Takes it 2x/day at 6AM and 6PM) Pt states without that medication he can hardly stand up even just to use the bathroom. Pt states he was on hydrocodone , but the tramadol is now able to manage his pain successfully.  Reports he was supposed to get an injection after the MRI through [facility], but now he is on blood thinners so the injection has been postponed.   From Initial Eval: Pt states he has hx of sciatica in L LE. Pt states recent CVA affected his L side and he is having numbness in L UE and LE. Pt states the CVA affected him from his jaw down through his L arm and L LE. Pt states he is left hand dominant. Pt states he was walking ~19miles daily and performing all household chores prior to CVA. Patient states he was very active and completely independent prior.   Pt states he had MRI yesterday for L LE sciatica and is going to see MD tomorrow to follow-up on results (with [facility]). Patient reporting current pain as 6/10 with it being greater this morning, but he took pain medication to ease it off prior to coming to therapy.   Pt accompanied by: significant other, Wife, [name]  PERTINENT HISTORY: PMH of Hyperlipidemia, BPH, HTN, hx of basal cell carcinoma, hx of squamous cell carcinoma, arthritis, R and L reverse shoulder arthroplasties (R in 2024 and L in 2017)  PAIN:  Are you having pain? Yes: NPRS scale: 6/10 after taking pain medication this AM Pain location: L LE sciatica Pain description: severe ache Aggravating factors: he can wake up with it, no specific aggravating factors Relieving factors: pt reports having to take pain medication  (hydrocodone ) this AM, hx of having injections and prednisone ; uses horse liniment   PRECAUTIONS: Fall  RED FLAGS:  None   WEIGHT BEARING RESTRICTIONS: No  FALLS: Has patient fallen in last 6 months? No and but states had a few stumbles getting his feet tangled up  LIVING ENVIRONMENT: Lives with: lives with their spouse Lives in: House/apartment Stairs: Yes: External: 3+1 steps; on right going up; 1 level house Has following equipment at home: Walker - 2 wheeled and built-in shower seat  PLOF: Independent, Independent with household mobility without device, Independent with homemaking with ambulation, Independent with gait, and Leisure: golf  PATIENT GOALS: get back to half normal being able to do yard work, wash a car, and golf  OBJECTIVE:  Note: Objective measures were completed at Evaluation unless otherwise noted.  DIAGNOSTIC FINDINGS:  EXAM: MRI HEAD WITHOUT CONTRAST   TECHNIQUE: Multiplanar, multiecho pulse sequences of the brain and surrounding structures were obtained without intravenous contrast.   COMPARISON:  CT head, CTA, CTP yesterday.  Brain MRI 02/22/2008.  IMPRESSION: 1. Positive for Acute lacunar infarct of the lateral Right Thalamus, but also abnormal diffusion throughout the right hippocampal formation. Both are Right PCA territory, although superimposed seizure related changes of the right hippocampus would be difficult to exclude. No associated hemorrhage or mass effect.   2. 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 No other acute intracranial abnormality identified.   Electronically Signed   By: VEAR Hurst M.D.   On: 07/21/2023 05:51  CTA head:   1. 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 There is some reconstitution of enhancement  within right posterior cerebral artery branches beginning at the distal P2 segment level. 2. 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 3. Atherosclerotic plaque within the intracranial internal carotid arteries with no more than mild stenosis.     Electronically Signed   By: Rockey Childs D.O.   On: 07/20/2023 19:32   COGNITION: Overall cognitive status: Within functional limits for tasks assessed   SENSATION: Light touch: Impaired  and basically absent Proprioception: Impaired  Can only feel deep pressure in L LE  COORDINATION: Incoordinated in L LE, likely due to sensory impairments  EDEMA:  Not formally assessed  MUSCLE TONE: Not formally assessed  MUSCLE LENGTH: Not formally assessed  DTRs:  Not formally assessed  POSTURE: rounded shoulders and forward head  LOWER EXTREMITY ROM:     Active  Right Eval  WFL/WNL throughout Left Eval  WFL/WNL throughout  Hip flexion    Hip extension    Hip abduction    Hip adduction    Hip internal rotation    Hip external rotation    Knee flexion    Knee extension    Ankle dorsiflexion    Ankle plantarflexion    Ankle inversion    Ankle eversion     (Blank rows = not tested)  LOWER EXTREMITY MMT:    MMT Right Eval Left Eval  Hip flexion 4+ 3+  Hip extension    Hip abduction    Hip adduction    Hip internal rotation    Hip external rotation    Knee flexion 4+ 4-  Knee extension 4+ 4+  Ankle dorsiflexion 4+ 3  Ankle plantarflexion 4+ 4-  Ankle inversion    Ankle eversion    (Blank rows = not tested)  Manual Muscle Test Scale 0/5 = No muscle contraction can be seen or felt 1/5 = Contraction can be felt, but there is no motion 2-/5 = Part moves through incomplete ROM w/ gravity decreased 2/5 = Part moves through complete ROM w/ gravity decreased 2+/5 = Part moves through incomplete ROM (<50%) against gravity or through complete  ROM w/ gravity 3-/5 = Part moves through incomplete ROM (>50%) against gravity 3/5 = Part moves through complete ROM against gravity 3+/5 = Part moves through complete ROM against gravity/slight resistance 4-/5= Holds test position against slight to moderate pressure 4/5 = Part moves through complete ROM against gravity/moderate resistance 4+/5= Holds test position against moderate to strong pressure 5/5 = Part moves through complete ROM against gravity/full resistance  BED MOBILITY:  Not tested, pt denies difficulty with this  TRANSFERS: Sit to stand: SBA and CGA  Assistive device utilized: Environmental consultant - 2 wheeled and None     Stand to sit: SBA and CGA  Assistive device utilized: Environmental consultant - 2 wheeled and None     Chair to chair: SBA and CGA  Assistive device utilized: Environmental consultant - 2 wheeled and None      *requires CGA when not using AD* RAMP:  Not tested  CURB:  Not tested  STAIRS: Findings: Level of Assistance: CGA and Min A, Stair Negotiation Technique: Step to Pattern Forwards with Single Rail on Right, Number of Stairs: 4, Height of Stairs: 6in   , and Comments: incoordinated, but no greater than min A for balance; only used R HR to simulate home environment GAIT: Findings: Gait Characteristics: step through pattern, decreased arm swing- Right, decreased arm swing- Left, decreased step length- Right, decreased step length- Left, decreased stride length, and wide BOS, Distance walked: 22ft + 370ft, Assistive device utilized:None, Level of assistance: CGA and Min A, and Comments: pt uses RW for all ambulation at home, but didn't use AD during session to assess patient's gait without  FUNCTIONAL TESTS:  5 times sit to stand: 15.12 seconds with arms across chest  6 minute walk test: need to assess 10 meter walk test: 0.93 m/s without AD with CGA for safety  Berg Balance Scale: need to assess Functional gait assessment: need to assess   PATIENT SURVEYS:  Stroke Impact Scale 35/80                                                                                                                               TREATMENT DATE: 09/01/2023  Unless otherwise stated, CGA was provided and gait belt donned in order to ensure pt safety throughout session.  Progress note assessment:   10 Meter Walk Test: Patient instructed to walk 10 meters (32.8 ft) as quickly and as safely as possible at their normal speed x2 and at a fast speed x2. Time measured from 2 meter mark to 8 meter mark to accommodate ramp-up and ramp-down.   Average Normal speed: 0.60ms   Cut  off scores: <0.4 m/s = household Ambulator, 0.4-0.8 m/s = limited community Ambulator, >0.8 m/s = community Ambulator, >1.2 m/s = crossing a street, <1.0 = increased fall risk MCID 0.05 m/s (small), 0.13 m/s (moderate), 0.06 m/s (significant)  (ANPTA Core Set of Outcome Measures for Adults with Neurologic Conditions, 2018)  6 Min Walk Test:  Instructed patient to ambulate as quickly and as safely as possible for 6 minutes using LRAD. Patient was allowed to take standing rest breaks without stopping the test, but if the patient required a sitting rest break the clock would be stopped and the test would be over.  Results: 1150 feet using no AD with CGA/supervision Asist; noted to have increases athetoid movement in the LUE with increased distance. . Results indicate that the patient has reduced endurance with ambulation compared to age matched norms.  Age Matched Norms: 13-69 yo M: 34 F: 61, 31-79 yo M: 33 F: 471, 66-89 yo M: 417 F: 392 MDC: 58.21 meters (190.98 feet) or 50 meters (ANPTA Core Set of Outcome Measures for Adults with Neurologic Conditions, 2018)  Patient demonstrates increased fall risk as noted by score of   44/56 on Berg Balance Scale.  (<36= high risk for falls, close to 100%; 37-45 significant >80%; 46-51 moderate >50%; 52-55 lower >25%)  [facility] PT Assessment - 09/01/23 0001       Berg Balance Test   Sit to Stand Able to  stand without using hands and stabilize independently    Standing Unsupported Able to stand safely 2 minutes    Sitting with Back Unsupported but Feet Supported on Floor or Stool Able to sit safely and securely 2 minutes    Stand to Sit Sits safely with minimal use of hands    Transfers Able to transfer safely, definite need of hands    Standing Unsupported with Eyes Closed Able to stand 10 seconds with supervision    Standing Unsupported with Feet Together Able to place feet together independently and stand for 1 minute with supervision    From Standing, Reach Forward with Outstretched Arm Can reach forward >12 cm safely (5)    From Standing Position, Pick up Object from Floor Able to pick up shoe, needs supervision    From Standing Position, Turn to Look Behind Over each Shoulder Looks behind from both sides and weight shifts well    Turn 360 Degrees Able to turn 360 degrees safely but slowly    Standing Unsupported, Alternately Place Feet on Step/Stool Able to stand independently and safely and complete 8 steps in 20 seconds    Standing Unsupported, One Foot in Front Able to take small step independently and hold 30 seconds    Standing on One Leg Tries to lift leg/unable to hold 3 seconds but remains standing independently    Total Score 44           PATIENT EDUCATION: Education details: Importance of performing L side skin assessments due to sensory deficits, checking hot/cold using R hand due to sensory deficits, findings of assessment today, exercises to perform at home until formal HEP developed  Interpretation of outcome measures. Eduation of s/s of CVA given location of lacunar and thalamus infarct.  Encouraged pt to request OT referral for L hand coordination. Person educated: Patient and Spouse Education method: Explanation Education comprehension: verbalized understanding and needs further education  HOME EXERCISE PROGRAM:  Access Code: [email or web address] URL:  [email or web address] Date: 08/06/2023 Prepared by: Lonni Gainer  Exercises - Standing March with Unilateral  Counter Support  - 1 x daily - 7 x weekly - 2 sets - 10 reps - Sit to Stand Without Arm Support  - 1 x daily - 7 x weekly - 2 sets - 10 reps - Side Stepping with Counter Support  - 1 x daily - 7 x weekly - 3 sets - 10 reps   GOALS: Goals reviewed with patient? Yes  SHORT TERM GOALS: Target date: 09/10/2023  Patient will be independent in home exercise program to improve strength/mobility for better functional independence with ADLs and functional mobility.  Baseline: provided on 5/29  Goal status: In progress   LONG TERM GOALS: Target date: 10/22/2023  Patient (> 64 years old) will complete five times sit to stand test (5XSTS) in < 12 seconds indicating an increased LE strength and improved balance.  Baseline: 15.12 seconds with arms across chest 6/24: 11.9 sec no UE support  Goal status: INITIAL  2.  Patient will increase Berg Balance score to > 51/56 to demonstrate improved balance and decreased fall risk during functional activities and ADLs.  Baseline: 38/56 6/24: 44/56 Goal status: INITIAL  3.  Patient will increase six minute walk test ( ) distance to >106ft for progression to community ambulator and improve gait ability  Baseline: 1,027 feet (313 meters, Avg speed 0.873m/s), no AD, with CGA and occasional min A 6/24: 1118ft no  Goal status: INITIAL  4.  Patient will increase 10 meter walk test to >1.27m/s as to improve gait speed for better community ambulation and to reduce fall risk. Baseline: 0.93 m/s without AD with CGA  6/24: 0.45ms  Goal status: INITIAL  5.  Patient will increase Functional Gait Assessment (FGA) score to >20/30 as to reduce fall risk and improve dynamic gait safety with community ambulation.  Baseline: 5/30 Deferred due to time Goal status: INITIAL  6.  Patient will improve Stroke Impact Scale-16 by >9 points  indicating patient experiencing increased independence with functional mobility tasks and ADLs to improve quality of life and decrease caregiver burden.  Baseline: 35/80 6/24: 69/80 Goal status: INITIAL  ASSESSMENT:  CLINICAL IMPRESSION:  Today's treatment continued to  focus goal assessment for progress note. Pt demonstrates improved balance, community mobility and safety with ambulation shown with improvement in 6 min walk test, BERG, 5x STS and gait speed. Pt was noted to have increased athetoid/ tremor like movement on this day with the LUE/LLE, especially at rest in the LLE, but with fatigue on the LUE. Pt has plans to see Neurologist tomorrow (6/25) Patient's condition has the potential to improve in response to therapy. Maximum improvement is yet to be obtained. The anticipated improvement is attainable and reasonable in a generally predictable time.   Pt will continue to benefit from skilled physical therapy interventions to address impairments, improve QOL, and attain therapy goals.    OBJECTIVE IMPAIRMENTS: Abnormal gait, decreased activity tolerance, decreased balance, decreased coordination, decreased endurance, decreased knowledge of condition, decreased knowledge of use of DME, decreased mobility, difficulty walking, decreased strength, increased fascial restrictions, impaired flexibility, impaired sensation, impaired UE functional use, improper body mechanics, and pain.   ACTIVITY LIMITATIONS: carrying, lifting, bending, sitting, standing, squatting, stairs, transfers, bathing, toileting, dressing, reach over head, hygiene/grooming, and locomotion level  PARTICIPATION LIMITATIONS: meal prep, cleaning, laundry, interpersonal relationship, driving, shopping, community activity, and yard work  PERSONAL FACTORS: Age, Fitness, Time since onset of injury/illness/exacerbation, and 3+ comorbidities: Hyperlipidemia, BPH, HTN, hx of basal cell carcinoma, hx of squamous cell carcinoma,  arthritis, R and  L reverse shoulder arthroplasties (R in 2024 and L in 2017) are also affecting patient's functional outcome.   REHAB POTENTIAL: Excellent  CLINICAL DECISION MAKING: Evolving/moderate complexity  EVALUATION COMPLEXITY: Moderate  PLAN:  PT FREQUENCY: 1-2x/week  PT DURATION: 12 weeks  PLANNED INTERVENTIONS: [id number]- PT Re-evaluation, 97750- Physical Performance Testing, 97110-Therapeutic exercises, 97530- Therapeutic activity, 97112- Neuromuscular re-education, 97535- Self Care, [id number]- Manual therapy, [phone]- Gait training, [phone]- Orthotic Initial, [phone]- Orthotic/Prosthetic subsequent, [phone]- Canalith repositioning, [phone]- Electrical stimulation (manual), Patient/Family education, Balance training, Stair training, Taping, Dry Needling, Joint mobilization, Joint manipulation, Spinal mobilization, Vestibular training, Visual/preceptual remediation/compensation, DME instructions, Cryotherapy, Moist heat, and Biofeedback  PLAN FOR NEXT SESSION: - participate in higher level dynamic balance and gait challenges including:  - horizontal and vertical head turns  - stepping towards external targets due to LLE incoordination  - gait in variable directions  - unstable surfaces - progress to practicing golf swing for return to recreational activities   Massie Dollar PT, DPT  Physical Therapist - [facility]  [facility]  11:43 AM 09/01/23

## 2023-09-03 ENCOUNTER — Ambulatory Visit

## 2023-09-03 DIAGNOSIS — R2681 Unsteadiness on feet: Secondary | ICD-10-CM

## 2023-09-03 DIAGNOSIS — M6281 Muscle weakness (generalized): Secondary | ICD-10-CM

## 2023-09-03 DIAGNOSIS — R269 Unspecified abnormalities of gait and mobility: Secondary | ICD-10-CM | POA: Diagnosis not present

## 2023-09-03 DIAGNOSIS — R262 Difficulty in walking, not elsewhere classified: Secondary | ICD-10-CM

## 2023-09-03 DIAGNOSIS — R278 Other lack of coordination: Secondary | ICD-10-CM

## 2023-09-03 NOTE — Therapy (Signed)
 OUTPATIENT PHYSICAL THERAPY NEURO TREATMENT     Patient Name: HALO LASKI MRN: [id number] DOB:May 08, 1945, 78 y.o., male Today's Date: 09/03/2023   PCP: Auston Reyes BIRCH, MD  REFERRING PROVIDER: Auston Reyes BIRCH, MD   END OF SESSION:   PT End of Session - 09/03/23 1400     Visit Number 11    Number of Visits 24    Date for PT Re-Evaluation 10/22/23    Progress Note Due on Visit 10    PT Start Time 1405    PT Stop Time 1445    PT Time Calculation (min) 40 min    Equipment Utilized During Treatment Gait belt    Activity Tolerance Patient tolerated treatment well    Behavior During Therapy WFL for tasks assessed/performed           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   Hypertension    Wears dentures    partial upper and lower   Past Surgical History:  Procedure Laterality Date   CATARACT EXTRACTION W/PHACO Right 09/29/2022   Procedure: CATARACT EXTRACTION PHACO AND INTRAOCULAR LENS PLACEMENT (IOC) RIGHT  10.98  00:56.5;  Surgeon: Myrna Adine Anes, MD;  Location: [facility];  Service: Ophthalmology;  Laterality: Right;   CATARACT EXTRACTION W/PHACO Left 10/13/2022   Procedure: CATARACT EXTRACTION PHACO AND INTRAOCULAR LENS PLACEMENT (IOC) LEFT 10.79 00:54.1;  Surgeon: Myrna Adine Anes, MD;  Location: [facility];  Service: Ophthalmology;  Laterality: Left;   COLONOSCOPY WITH PROPOFOL  N/A 04/21/2018   Procedure: COLONOSCOPY WITH PROPOFOL ;  Surgeon: Janalyn Keene NOVAK, MD;  Location: [facility];  Service: Endoscopy;  Laterality: N/A;   INGUINAL HERNIA REPAIR Left 1990's   INGUINAL HERNIA REPAIR Right 2015   REVERSE SHOULDER ARTHROPLASTY Left 11/06/2015   Procedure: REVERSE SHOULDER ARTHROPLASTY;  Surgeon: Norleen JINNY Maltos, MD;  Location: [facility];  Service: Orthopedics;  Laterality: Left;   REVERSE SHOULDER ARTHROPLASTY Right 03/25/2022   Procedure: REVERSE SHOULDER ARTHROPLASTY;  Surgeon: Maltos Norleen JINNY, MD;  Location: [facility];  Service: Orthopedics;  Laterality: Right;  MAKE 2ND CASE   Patient Active Problem List   Diagnosis Date Noted   Acute CVA (cerebrovascular accident) (HCC) 07/21/2023   Elevated blood pressure reading 07/21/2023   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   Acute diverticulitis 05/07/2018   History of colonic polyps    Benign neoplasm of ascending colon    Diverticulosis of large intestine without diverticulitis    Erectile dysfunction due to arterial insufficiency 02/18/2018   Degenerative disc disease, lumbar 08/26/2016   Bilateral hip pain 06/24/2016   Chronic midline low back pain 06/24/2016   Polyarthralgia 06/24/2016   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   Hematospermia 05/03/2015   Colon polyps 08/16/2013   Hematuria 08/16/2013   Hyperlipidemia 08/16/2013   Incomplete emptying of bladder 06/23/2013   Benign prostatic hyperplasia with incomplete bladder emptying 11/11/2011   Elevated PSA 11/11/2011   Reduced libido 11/11/2011    ONSET DATE: 07/20/2023 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, right PCA occlusion   REFERRING DIAG: I63.50 (ICD-10-CM) - Cerebral infarction due to unspecified occlusion or stenosis of unspecified  cerebral artery   THERAPY DIAG:  Unsteadiness on feet  Other lack of coordination  Difficulty in walking, not elsewhere classified  Muscle weakness (generalized)  Rationale for Evaluation and Treatment: Rehabilitation  SUBJECTIVE:                                                                                                                                                                                             SUBJECTIVE STATEMENT:  Pt  states he is not getting around as good as I was. Reports LLE is shaking more.  Accompanied by self today  From Initial Eval: Pt states he has hx of sciatica in L LE. Pt states recent CVA affected his L side and he is having numbness in L UE and LE. Pt states the CVA affected him from his jaw down through his L arm and L LE. Pt states he is left hand dominant. Pt states he was walking ~63miles daily and performing all household chores prior to CVA. Patient states he was very active and completely independent prior.   Pt states he had MRI yesterday for L LE sciatica and is going to see MD tomorrow to follow-up on results (with [facility]). Patient reporting current pain as 6/10 with it being greater this morning, but he took pain medication to ease it off prior to coming to therapy.   Pt accompanied by: significant other, Wife, [name]  PERTINENT HISTORY: PMH of Hyperlipidemia, BPH, HTN, hx of basal cell carcinoma, hx of squamous cell carcinoma, arthritis, R and L reverse shoulder arthroplasties (R in 2024 and L in 2017)  PAIN:  Are you having pain? Yes: NPRS scale: 6/10 after taking pain medication this AM Pain location: L LE sciatica Pain description: severe ache Aggravating factors: he can wake up with it, no specific aggravating factors Relieving factors: pt reports having to take pain medication (hydrocodone ) this AM, hx of having injections and prednisone ; uses horse liniment   PRECAUTIONS: Fall  RED FLAGS: None   WEIGHT BEARING RESTRICTIONS: No  FALLS: Has patient fallen in last 6 months? No and but states had a few stumbles getting his feet tangled up  LIVING ENVIRONMENT: Lives with: lives with their spouse Lives in: House/apartment Stairs: Yes: External: 3+1 steps; on right going up; 1 level house Has following equipment at home: Walker - 2 wheeled and built-in shower seat  PLOF: Independent, Independent with household mobility without device, Independent with homemaking  with ambulation, Independent with gait, and Leisure: golf  PATIENT GOALS: get back to half normal being able to do yard work, wash a car, and golf  OBJECTIVE:  Note: Objective measures  were completed at Evaluation unless otherwise noted.  DIAGNOSTIC FINDINGS:  EXAM: MRI HEAD WITHOUT CONTRAST   TECHNIQUE: Multiplanar, multiecho pulse sequences of the brain and surrounding structures were obtained without intravenous contrast.   COMPARISON:  CT head, CTA, CTP yesterday.  Brain MRI 02/22/2008.  IMPRESSION: 1. Positive for Acute lacunar infarct of the lateral Right Thalamus, but also abnormal diffusion throughout the right hippocampal formation. Both are Right PCA territory, although superimposed seizure related changes of the right hippocampus would be difficult to exclude. No associated hemorrhage or mass effect.   2. 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 No other acute intracranial abnormality identified.   Electronically Signed   By: VEAR Hurst M.D.   On: 07/21/2023 05:51  CTA head:   1. 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 There is some reconstitution of enhancement within right posterior cerebral artery branches beginning at the distal P2 segment level. 2. 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 3. Atherosclerotic plaque within the intracranial internal carotid arteries with no more than mild stenosis.     Electronically Signed   By: Rockey Childs D.O.   On: 07/20/2023 19:32   COGNITION: Overall cognitive status: Within functional limits for tasks assessed   SENSATION: Light touch: Impaired  and basically absent Proprioception: Impaired  Can only feel deep pressure in L  LE  COORDINATION: Incoordinated in L LE, likely due to sensory impairments  EDEMA:  Not formally assessed  MUSCLE TONE: Not formally assessed  MUSCLE LENGTH: Not formally assessed  DTRs:  Not formally assessed  POSTURE: rounded shoulders and forward head  LOWER EXTREMITY ROM:     Active  Right Eval  WFL/WNL throughout Left Eval  WFL/WNL throughout  Hip flexion    Hip extension    Hip abduction    Hip adduction    Hip internal rotation    Hip external rotation    Knee flexion    Knee extension    Ankle dorsiflexion    Ankle plantarflexion    Ankle inversion    Ankle eversion     (Blank rows = not tested)  LOWER EXTREMITY MMT:    MMT Right Eval Left Eval  Hip flexion 4+ 3+  Hip extension    Hip abduction    Hip adduction    Hip internal rotation    Hip external rotation    Knee flexion 4+ 4-  Knee extension 4+ 4+  Ankle dorsiflexion 4+ 3  Ankle plantarflexion 4+ 4-  Ankle inversion    Ankle eversion    (Blank rows = not tested)  Manual Muscle Test Scale 0/5 = No muscle contraction can be seen or felt 1/5 = Contraction can be felt, but there is no motion 2-/5 = Part moves through incomplete ROM w/ gravity decreased 2/5 = Part moves through complete ROM w/ gravity decreased 2+/5 = Part moves through incomplete ROM (<50%) against gravity or through complete ROM w/ gravity 3-/5 = Part moves through incomplete ROM (>50%) against gravity 3/5 = Part moves through complete ROM against gravity 3+/5 = Part moves through complete ROM against gravity/slight resistance 4-/5= Holds test position against slight to moderate pressure 4/5 = Part moves through complete ROM against gravity/moderate resistance 4+/5=  Holds test position against moderate to strong pressure 5/5 = Part moves through complete ROM against gravity/full resistance  BED MOBILITY:  Not tested, pt denies difficulty with this  TRANSFERS: Sit to stand: SBA and CGA  Assistive device utilized:  Environmental consultant - 2 wheeled and None     Stand to sit: SBA and CGA  Assistive device utilized: Environmental consultant - 2 wheeled and None     Chair to chair: SBA and CGA  Assistive device utilized: Environmental consultant - 2 wheeled and None      *requires CGA when not using AD* RAMP:  Not tested  CURB:  Not tested  STAIRS: Findings: Level of Assistance: CGA and Min A, Stair Negotiation Technique: Step to Pattern Forwards with Single Rail on Right, Number of Stairs: 4, Height of Stairs: 6in   , and Comments: incoordinated, but no greater than min A for balance; only used R HR to simulate home environment GAIT: Findings: Gait Characteristics: step through pattern, decreased arm swing- Right, decreased arm swing- Left, decreased step length- Right, decreased step length- Left, decreased stride length, and wide BOS, Distance walked: 89ft + 356ft, Assistive device utilized:None, Level of assistance: CGA and Min A, and Comments: pt uses RW for all ambulation at home, but didn't use AD during session to assess patient's gait without  FUNCTIONAL TESTS:  5 times sit to stand: 15.12 seconds with arms across chest  6 minute walk test: need to assess 10 meter walk test: 0.93 m/s without AD with CGA for safety  Berg Balance Scale: need to assess Functional gait assessment: need to assess   PATIENT SURVEYS:  Stroke Impact Scale 35/80                                                                                                                              TREATMENT DATE: 09/03/2023  Unless otherwise stated, CGA was provided and gait belt donned in order to ensure pt safety throughout session.  TA::   Nustep- B UE and B LE reciprocal movement pattern for cardiovascular training and B LE functional strengthening Lvl 1 x 1 min  Lvl 3 x 3 min Lvl 4 x 1 min Lvl 1 x 1 min  SPM in 60s   NMR:  -Fwd lunge squat walk wihtout UE support down and back 5 x 16 ft - rates medium. Near support surface  -Wide semi-tandem gait down and back  - 6x 15 ft (some reaching with UE for support). Near support surface  -SLS 3x30 sec with one finger support on  balance bar    TE: Seated march 3x15 each LE with brief rest between sets LAQ 3x12 each LE Seated ankle rockers 3x15 bilat LE   R tband hamstring curls 3x12 each LE    PATIENT EDUCATION: Education details:Pt educated throughout session about proper posture and technique with exercises. Improved exercise technique, movement at target joints, use of target muscles after min to mod verbal, visual, tactile  cues.  Person educated: Patient Education method: Explanation Education comprehension: verbalized understanding and needs further education  HOME EXERCISE PROGRAM:  Access Code: [email or web address] URL: [email or web address] Date: 08/06/2023 Prepared by: Lonni Gainer  Exercises - Standing March with Unilateral Counter Support  - 1 x daily - 7 x weekly - 2 sets - 10 reps - Sit to Stand Without Arm Support  - 1 x daily - 7 x weekly - 2 sets - 10 reps - Side Stepping with Counter Support  - 1 x daily - 7 x weekly - 3 sets - 10 reps   GOALS: Goals reviewed with patient? Yes  SHORT TERM GOALS: Target date: 09/10/2023  Patient will be independent in home exercise program to improve strength/mobility for better functional independence with ADLs and functional mobility.  Baseline: provided on 5/29  Goal status: In progress   LONG TERM GOALS: Target date: 10/22/2023  Patient (> 37 years old) will complete five times sit to stand test (5XSTS) in < 12 seconds indicating an increased LE strength and improved balance.  Baseline: 15.12 seconds with arms across chest 6/24: 11.9 sec no UE support  Goal status: INITIAL  2.  Patient will increase Berg Balance score to > 51/56 to demonstrate improved balance and decreased fall risk during functional activities and ADLs.  Baseline: 38/56 6/24: 44/56 Goal status: INITIAL  3.  Patient will increase six minute walk test  ( ) distance to >1023ft for progression to community ambulator and improve gait ability  Baseline: 1,027 feet (313 meters, Avg speed 0.831m/s), no AD, with CGA and occasional min A 6/24: 11100ft no  Goal status: INITIAL  4.  Patient will increase 10 meter walk test to >1.63m/s as to improve gait speed for better community ambulation and to reduce fall risk. Baseline: 0.93 m/s without AD with CGA  6/24: 0.60ms  Goal status: INITIAL  5.  Patient will increase Functional Gait Assessment (FGA) score to >20/30 as to reduce fall risk and improve dynamic gait safety with community ambulation.  Baseline: 5/30 Deferred due to time Goal status: INITIAL  6.  Patient will improve Stroke Impact Scale-16 by >9 points indicating patient experiencing increased independence with functional mobility tasks and ADLs to improve quality of life and decrease caregiver burden.  Baseline: 35/80 6/24: 69/80 Goal status: INITIAL  ASSESSMENT:  CLINICAL IMPRESSION:  Author continued plan as laid out in recent treatment sessions. Pt tolerated interventions well without significant fatigue and without pain. Pt able to progress SLS activity in session to completing with one finger support on bar. He still struggles with semi-tandem gait, and is very tremulous throughout. Pt will continue to benefit from skilled physical therapy interventions to address impairments, improve QOL, and attain therapy goals.    OBJECTIVE IMPAIRMENTS: Abnormal gait, decreased activity tolerance, decreased balance, decreased coordination, decreased endurance, decreased knowledge of condition, decreased knowledge of use of DME, decreased mobility, difficulty walking, decreased strength, increased fascial restrictions, impaired flexibility, impaired sensation, impaired UE functional use, improper body mechanics, and pain.   ACTIVITY LIMITATIONS: carrying, lifting, bending, sitting, standing, squatting, stairs, transfers, bathing, toileting,  dressing, reach over head, hygiene/grooming, and locomotion level  PARTICIPATION LIMITATIONS: meal prep, cleaning, laundry, interpersonal relationship, driving, shopping, community activity, and yard work  PERSONAL FACTORS: Age, Fitness, Time since onset of injury/illness/exacerbation, and 3+ comorbidities: Hyperlipidemia, BPH, HTN, hx of basal cell carcinoma, hx of squamous cell carcinoma, arthritis, R and L reverse shoulder arthroplasties (R in 2024 and L in 2017) are also affecting patient's functional outcome.  REHAB POTENTIAL: Excellent  CLINICAL DECISION MAKING: Evolving/moderate complexity  EVALUATION COMPLEXITY: Moderate  PLAN:  PT FREQUENCY: 1-2x/week  PT DURATION: 12 weeks  PLANNED INTERVENTIONS: [id number]- PT Re-evaluation, 97750- Physical Performance Testing, 97110-Therapeutic exercises, 97530- Therapeutic activity, 97112- Neuromuscular re-education, 97535- Self Care, [id number]- Manual therapy, [phone]- Gait training, [phone]- Orthotic Initial, [phone]- Orthotic/Prosthetic subsequent, [phone]- Canalith repositioning, [phone]- Electrical stimulation (manual), Patient/Family education, Balance training, Stair training, Taping, Dry Needling, Joint mobilization, Joint manipulation, Spinal mobilization, Vestibular training, Visual/preceptual remediation/compensation, DME instructions, Cryotherapy, Moist heat, and Biofeedback  PLAN FOR NEXT SESSION: - participate in higher level dynamic balance and gait challenges including:  - horizontal and vertical head turns  - stepping towards external targets due to LLE incoordination  - gait in variable directions  - unstable surfaces - progress to practicing golf swing for return to recreational activities   Darryle Patten PT, DPT  Physical Therapist - [facility]  [facility]  4:32 PM 09/03/23

## 2023-09-08 ENCOUNTER — Ambulatory Visit: Attending: Internal Medicine | Admitting: Physical Therapy

## 2023-09-08 DIAGNOSIS — R269 Unspecified abnormalities of gait and mobility: Secondary | ICD-10-CM | POA: Diagnosis present

## 2023-09-08 DIAGNOSIS — R262 Difficulty in walking, not elsewhere classified: Secondary | ICD-10-CM | POA: Diagnosis present

## 2023-09-08 DIAGNOSIS — R2681 Unsteadiness on feet: Secondary | ICD-10-CM | POA: Insufficient documentation

## 2023-09-08 DIAGNOSIS — R208 Other disturbances of skin sensation: Secondary | ICD-10-CM | POA: Diagnosis present

## 2023-09-08 DIAGNOSIS — M5432 Sciatica, left side: Secondary | ICD-10-CM | POA: Diagnosis present

## 2023-09-08 DIAGNOSIS — R278 Other lack of coordination: Secondary | ICD-10-CM | POA: Insufficient documentation

## 2023-09-08 DIAGNOSIS — M6281 Muscle weakness (generalized): Secondary | ICD-10-CM | POA: Diagnosis present

## 2023-09-08 DIAGNOSIS — I69352 Hemiplegia and hemiparesis following cerebral infarction affecting left dominant side: Secondary | ICD-10-CM | POA: Insufficient documentation

## 2023-09-08 NOTE — Therapy (Signed)
 OUTPATIENT PHYSICAL THERAPY NEURO TREATMENT     Patient Name: George Bruce MRN: [id number] DOB:1945/05/30, 78 y.o., male Today's Date: 09/08/2023   PCP: George Reyes BIRCH, MD  REFERRING PROVIDER: Auston Reyes BIRCH, MD   END OF SESSION:   PT End of Session - 09/08/23 0803     Visit Number 12    Number of Visits 24    Date for PT Re-Evaluation 10/22/23    Progress Note Due on Visit 10    PT Start Time 0804    PT Stop Time 0847    PT Time Calculation (min) 43 min    Equipment Utilized During Treatment Gait belt    Activity Tolerance Patient tolerated treatment well    Behavior During Therapy WFL for tasks assessed/performed            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   Hypertension    Wears dentures    partial upper and lower   Past Surgical History:  Procedure Laterality Date   CATARACT EXTRACTION W/PHACO Right 09/29/2022   Procedure: CATARACT EXTRACTION PHACO AND INTRAOCULAR LENS PLACEMENT (IOC) RIGHT  10.98  00:56.5;  Surgeon: George Adine Anes, MD;  Location: [facility];  Service: Ophthalmology;  Laterality: Right;   CATARACT EXTRACTION W/PHACO Left 10/13/2022   Procedure: CATARACT EXTRACTION PHACO AND INTRAOCULAR LENS PLACEMENT (IOC) LEFT 10.79 00:54.1;  Surgeon: George Adine Anes, MD;  Location: [facility];  Service: Ophthalmology;  Laterality: Left;   COLONOSCOPY WITH PROPOFOL  N/A 04/21/2018   Procedure: COLONOSCOPY WITH PROPOFOL ;  Surgeon: George Keene NOVAK, MD;  Location: [facility];  Service: Endoscopy;  Laterality: N/A;   INGUINAL HERNIA REPAIR Left 1990's   INGUINAL HERNIA REPAIR Right 2015   REVERSE SHOULDER ARTHROPLASTY Left 11/06/2015   Procedure: REVERSE SHOULDER ARTHROPLASTY;  Surgeon: George Bruce Maltos, MD;  Location: [facility];  Service: Orthopedics;  Laterality: Left;   REVERSE SHOULDER ARTHROPLASTY Right 03/25/2022   Procedure: REVERSE SHOULDER ARTHROPLASTY;  Surgeon: Bruce George JINNY, MD;  Location: [facility];  Service: Orthopedics;  Laterality: Right;  MAKE 2ND CASE   Patient Active Problem List   Diagnosis Date Noted   Acute CVA (cerebrovascular accident) (HCC) 07/21/2023   Elevated blood pressure reading 07/21/2023   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   Acute diverticulitis 05/07/2018   History of colonic polyps    Benign neoplasm of ascending colon    Diverticulosis of large intestine without diverticulitis    Erectile dysfunction due to arterial insufficiency 02/18/2018   Degenerative disc disease, lumbar 08/26/2016   Bilateral hip pain 06/24/2016   Chronic midline low back pain 06/24/2016   Polyarthralgia 06/24/2016   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   Hematospermia 05/03/2015   Colon polyps 08/16/2013   Hematuria 08/16/2013   Hyperlipidemia 08/16/2013   Incomplete emptying of bladder 06/23/2013   Benign prostatic hyperplasia with incomplete bladder emptying 11/11/2011   Elevated PSA 11/11/2011   Reduced libido 11/11/2011    ONSET DATE: 07/20/2023 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, right PCA occlusion   REFERRING DIAG: I63.50 (ICD-10-CM) - Cerebral infarction due to unspecified occlusion or stenosis of  unspecified cerebral artery   THERAPY DIAG:  Unsteadiness on feet  Other lack of coordination  Difficulty in walking, not elsewhere classified  Muscle weakness (generalized)  Abnormality of gait  Other disturbances of skin sensation  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  Pt states I have my days, but I'm not jumping quiet as bad. Pt went to family medicine MD/PCP and pt states that MD reports pt's jumping in his LEs may be Parkinson's; however, per chart review pt's neurologist reports pt's restlessness in L LE is due to dyskinesia from the CVA. Patient's PCP prescribed a medication to address his restless leg symptoms  (pt unable to recall medicine name), but pt states it made him too dizzy so he called and had they changed the prescription (pt unable to recall medicine name), but it made him really sleepy (reports he slept for 3hrs yesterday). Pt planning to continue following up with PCP.  Pt states he is still taking tramadol in morning and at night, which is keeping his sciatic pain under control. Reports 1x he forgot to take the tramadol at night and he woke up in pain at 4AM.   Pt reports he loves participating in meals on wheels, but he is concerned he will drop the plates.   No reports of pain during session and no reports of falls.  Accompanied by self today    From Initial Eval: Pt states he has hx of sciatica in L LE. Pt states recent CVA affected his L side and he is having numbness in L UE and LE. Pt states the CVA affected him from his jaw down through his L arm and L LE. Pt states he is left hand dominant. Pt states he was walking ~63miles daily and performing all household chores prior to CVA. Patient states he was very active and completely independent prior.   Pt states he had MRI yesterday for L LE sciatica and is going to see MD tomorrow to follow-up on results (with [facility]). Patient reporting current pain as 6/10 with it being greater this morning, but he took pain medication to ease it off prior to coming to  therapy.   Pt accompanied by: significant other,  Wife, George Bruce  PERTINENT HISTORY: PMH of Hyperlipidemia, BPH, HTN, hx of basal cell carcinoma, hx of squamous cell carcinoma, arthritis, R and L reverse shoulder arthroplasties (R in 2024 and L in 2017)  PAIN:  Are you having pain? Yes: NPRS scale: 6/10 after taking pain medication this AM Pain location: L LE sciatica Pain description: severe ache Aggravating factors: he can wake up with it, no specific aggravating factors Relieving factors: pt reports having to take pain medication (hydrocodone ) this AM, hx of having injections and prednisone ; uses horse liniment   PRECAUTIONS: Fall  RED FLAGS: None   WEIGHT BEARING RESTRICTIONS: No  FALLS: Has patient fallen in last 6 months? No and but states had a few stumbles getting his feet tangled up  LIVING ENVIRONMENT: Lives with: lives with their spouse Lives in: House/apartment Stairs: Yes: External: 3+1 steps; on right going up; 1 level house Has following equipment at home: Walker - 2 wheeled and built-in shower seat  PLOF: Independent, Independent with household mobility without device, Independent with homemaking with ambulation, Independent with gait, and Leisure: golf  PATIENT GOALS: get back to half normal being able to do yard work, wash a car, and golf  OBJECTIVE:  Note: Objective measures were completed at Evaluation unless otherwise noted.  DIAGNOSTIC FINDINGS:  EXAM: MRI HEAD WITHOUT CONTRAST   TECHNIQUE: Multiplanar, multiecho pulse sequences of the brain and surrounding structures were obtained without intravenous contrast.   COMPARISON:  CT head, CTA, CTP yesterday.  Brain MRI 02/22/2008.  IMPRESSION: 1. Positive for Acute lacunar infarct of the lateral Right Thalamus, but also abnormal diffusion throughout the right hippocampal formation. Both are Right PCA territory, although superimposed seizure related changes of the right hippocampus would be  difficult to exclude. No associated hemorrhage or mass effect.   2. 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 No other acute intracranial abnormality identified.   Electronically Signed   By: VEAR Hurst M.D.   On: 07/21/2023 05:51  CTA head:   1. 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 There is some reconstitution of enhancement within right posterior cerebral artery branches beginning at the distal P2 segment level. 2. 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 3. Atherosclerotic plaque within the intracranial internal carotid arteries with no more than mild stenosis.     Electronically Signed   By: Rockey Childs D.O.   On: 07/20/2023 19:32   COGNITION: Overall cognitive status: Within functional limits for tasks assessed   SENSATION: Light touch: Impaired  and basically absent Proprioception: Impaired  Can only feel deep pressure in L LE  COORDINATION: Incoordinated in L LE, likely due to sensory impairments  EDEMA:  Not formally assessed  MUSCLE TONE: Not formally assessed  MUSCLE LENGTH: Not formally assessed  DTRs:  Not formally assessed  POSTURE: rounded shoulders and forward head  LOWER EXTREMITY ROM:     Active  Right Eval  WFL/WNL throughout Left Eval  WFL/WNL throughout  Hip flexion    Hip extension    Hip abduction    Hip adduction    Hip internal rotation    Hip external rotation    Knee flexion    Knee extension    Ankle dorsiflexion    Ankle  plantarflexion    Ankle inversion    Ankle eversion     (Blank rows = not tested)  LOWER EXTREMITY MMT:    MMT Right Eval Left Eval  Hip flexion 4+ 3+  Hip extension    Hip abduction    Hip adduction    Hip  internal rotation    Hip external rotation    Knee flexion 4+ 4-  Knee extension 4+ 4+  Ankle dorsiflexion 4+ 3  Ankle plantarflexion 4+ 4-  Ankle inversion    Ankle eversion    (Blank rows = not tested)  Manual Muscle Test Scale 0/5 = No muscle contraction can be seen or felt 1/5 = Contraction can be felt, but there is no motion 2-/5 = Part moves through incomplete ROM w/ gravity decreased 2/5 = Part moves through complete ROM w/ gravity decreased 2+/5 = Part moves through incomplete ROM (<50%) against gravity or through complete ROM w/ gravity 3-/5 = Part moves through incomplete ROM (>50%) against gravity 3/5 = Part moves through complete ROM against gravity 3+/5 = Part moves through complete ROM against gravity/slight resistance 4-/5= Holds test position against slight to moderate pressure 4/5 = Part moves through complete ROM against gravity/moderate resistance 4+/5= Holds test position against moderate to strong pressure 5/5 = Part moves through complete ROM against gravity/full resistance  BED MOBILITY:  Not tested, pt denies difficulty with this  TRANSFERS: Sit to stand: SBA and CGA  Assistive device utilized: Environmental consultant - 2 wheeled and None     Stand to sit: SBA and CGA  Assistive device utilized: Environmental consultant - 2 wheeled and None     Chair to chair: SBA and CGA  Assistive device utilized: Environmental consultant - 2 wheeled and None      *requires CGA when not using AD* RAMP:  Not tested  CURB:  Not tested  STAIRS: Findings: Level of Assistance: CGA and Min A, Stair Negotiation Technique: Step to Pattern Forwards with Single Rail on Right, Number of Stairs: 4, Height of Stairs: 6in   , and Comments: incoordinated, but no greater than min A for balance; only used R HR to simulate home environment GAIT: Findings: Gait Characteristics: step through pattern, decreased arm swing- Right, decreased arm swing- Left, decreased step length- Right, decreased step length- Left, decreased stride length,  and wide BOS, Distance walked: 70ft + 398ft, Assistive device utilized:None, Level of assistance: CGA and Min A, and Comments: pt uses RW for all ambulation at home, but didn't use AD during session to assess patient's gait without  FUNCTIONAL TESTS:  5 times sit to stand: 15.12 seconds with arms across chest  6 minute walk test: need to assess 10 meter walk test: 0.93 m/s without AD with CGA for safety  Berg Balance Scale: need to assess Functional gait assessment: need to assess   PATIENT SURVEYS:  Stroke Impact Scale 35/80  TREATMENT DATE: 09/08/2023  Pt arrives to clinic ambulating with SPC.   Unless otherwise stated, CGA was provided and gait belt donned in order to ensure pt safety throughout session.  B UE and B LE reciprocal movement pattern on Octane for cardiovascular training and B UE & LE functional strengthening against level 1 resistance for 2 minutes, increased to level 3 for 4 minutes, totaling 6 minutes and 0.66miles Pt reports liking the Octane device more than the Nustep  Gait training forward ~16ft + ~162ft of backwards gait with dual-task challenge of head turns to visually scan to locate and name targets on walls  Noticed slower backwards gait with R foot sliding on floor and minor balance instability No significant LOB with forward gait head turns, but pt does have decreased gait speed to maintain stability Pt reports enjoying the challenge of dual task during gait   Dynamic gait training while carrying small green dynadisc with an upside down hedgehog on top of it. Navigated over red mat and up/down stairs using 1 UE support on handrail - CGA/light min A for safety/steadying and pt able to balance plate without dropping it or knocking hedgehog off. Progressed to having 2lb AW on the dynadisc in addition to hedgehog to make it more  challenging Of note, pt did select to carry the plate in his R UE throughout the task because that is what he would do in real life Added RTB resistance around waist for therapist to provide spontaneous perturbations Pt able to utilize appropriate stepping strategies to maintain balance without increased assist  Pt also reports sometimes he has weakness in B LEs when going to stand up after prolonged sitting.  Repeated sit<>stands from green chair holding 5lb dumbbell in each hand x10reps Transitioned to lifting 10lb ball and throwing it once in standing for added perturbation, repeated additional 10reps Pt with good ability to rise into standing with no overt instability when throwing ball   Step-ups onto green step with 2x purple plates x5 reps per LE, no UE support Light min A for steadying with 1x minor LOB towards L when stepping back leading with L LE  Could progress this to have increased dynamic balance once stepping up onto step    PATIENT EDUCATION: Education details:Pt educated throughout session about proper posture and technique with exercises. Improved exercise technique, movement at target joints, use of target muscles after min to mod verbal, visual, tactile cues.  Person educated: Patient Education method: Explanation Education comprehension: verbalized understanding and needs further education  HOME EXERCISE PROGRAM:  Access Code: [email or web address] URL: [email or web address] Date: 08/06/2023 Prepared by: Lonni Gainer  Exercises - Standing March with Unilateral Counter Support  - 1 x daily - 7 x weekly - 2 sets - 10 reps - Sit to Stand Without Arm Support  - 1 x daily - 7 x weekly - 2 sets - 10 reps - Side Stepping with Counter Support  - 1 x daily - 7 x weekly - 3 sets - 10 reps   GOALS: Goals reviewed with patient? Yes  SHORT TERM GOALS: Target date: 09/10/2023  Patient will be independent in home exercise program to improve strength/mobility  for better functional independence with ADLs and functional mobility.  Baseline: provided on 5/29  Goal status: In progress   LONG TERM GOALS: Target date: 10/22/2023  Patient (> 55 years old) will complete five times sit to stand test (5XSTS) in < 12 seconds indicating an increased LE strength and improved balance.  Baseline:  15.12 seconds with arms across chest 6/24: 11.9 sec no UE support  Goal status: INITIAL  2.  Patient will increase Berg Balance score to > 51/56 to demonstrate improved balance and decreased fall risk during functional activities and ADLs.  Baseline: 38/56 6/24: 44/56 Goal status: INITIAL  3.  Patient will increase six minute walk test ( ) distance to >101ft for progression to community ambulator and improve gait ability  Baseline: 1,027 feet (313 meters, Avg speed 0.896m/s), no AD, with CGA and occasional min A 6/24: 1150ft no  Goal status: INITIAL  4.  Patient will increase 10 meter walk test to >1.69m/s as to improve gait speed for better community ambulation and to reduce fall risk. Baseline: 0.93 m/s without AD with CGA  6/24: 0.28ms  Goal status: INITIAL  5.  Patient will increase Functional Gait Assessment (FGA) score to >20/30 as to reduce fall risk and improve dynamic gait safety with community ambulation.  Baseline: 5/30 Deferred due to time Goal status: INITIAL  6.  Patient will improve Stroke Impact Scale-16 by >9 points indicating patient experiencing increased independence with functional mobility tasks and ADLs to improve quality of life and decrease caregiver burden.  Baseline: 35/80 6/24: 69/80 Goal status: INITIAL  ASSESSMENT:  CLINICAL IMPRESSION:  Patient arrived motivated to participate in therapy and reports the restlessness in his LEs is not as significant today. Patient reports he really enjoys doing meals on wheels, but hasn't been able to carry the foot while ambulating due to concern he will drop it. Pt participates in  dynamic gait training task to simulate this via carrying weighted dynadisc while ambulating over compliant surface and up/down stairs, which pt was able to complete successfully without dropping the plate. Patient also reports having some weakness in B LEs when going to stand therefore focused on B LE functional strengthening during dynamic balance tasks. Patient could progress to additional dynamic balance challenge during step-ups. Pt will continue to benefit from skilled physical therapy interventions to address impairments, improve QOL, and attain therapy goals.    OBJECTIVE IMPAIRMENTS: Abnormal gait, decreased activity tolerance, decreased balance, decreased coordination, decreased endurance, decreased knowledge of condition, decreased knowledge of use of DME, decreased mobility, difficulty walking, decreased strength, increased fascial restrictions, impaired flexibility, impaired sensation, impaired UE functional use, improper body mechanics, and pain.   ACTIVITY LIMITATIONS: carrying, lifting, bending, sitting, standing, squatting, stairs, transfers, bathing, toileting, dressing, reach over head, hygiene/grooming, and locomotion level  PARTICIPATION LIMITATIONS: meal prep, cleaning, laundry, interpersonal relationship, driving, shopping, community activity, and yard work  PERSONAL FACTORS: Age, Fitness, Time since onset of injury/illness/exacerbation, and 3+ comorbidities: Hyperlipidemia, BPH, HTN, hx of basal cell carcinoma, hx of squamous cell carcinoma, arthritis, R and L reverse shoulder arthroplasties (R in 2024 and L in 2017) are also affecting patient's functional outcome.   REHAB POTENTIAL: Excellent  CLINICAL DECISION MAKING: Evolving/moderate complexity  EVALUATION COMPLEXITY: Moderate  PLAN:  PT FREQUENCY: 1-2x/week  PT DURATION: 12 weeks  PLANNED INTERVENTIONS: [id number]- PT Re-evaluation, 97750- Physical Performance Testing, 97110-Therapeutic exercises, 97530- Therapeutic  activity, [id number]- Neuromuscular re-education, 97535- Self Care, [id number]- Manual therapy, [id number]- Gait training, [phone]- Orthotic Initial, [phone]- Orthotic/Prosthetic subsequent, [phone]- Canalith repositioning, [phone]- Electrical stimulation (manual), Patient/Family education, Balance training, Stair training, Taping, Dry Needling, Joint mobilization, Joint manipulation, Spinal mobilization, Vestibular training, Visual/preceptual remediation/compensation, DME instructions, Cryotherapy, Moist heat, and Biofeedback  PLAN FOR NEXT SESSION:  - participate in higher level dynamic balance and gait challenges including:  - horizontal and vertical head turns  -  stepping towards external targets due to LLE incoordination  - gait in variable directions  - unstable surfaces - step-ups for B LE strengthening with simultaneous dynamic balance tasks - progress to practicing golf swing for return to recreational activities    Connell Kiss, PT, DPT, NCS, CSRS Physical Therapist - [facility]  [facility]  8:49 AM 09/08/23

## 2023-09-15 ENCOUNTER — Ambulatory Visit: Admitting: Physical Therapy

## 2023-09-15 DIAGNOSIS — M6281 Muscle weakness (generalized): Secondary | ICD-10-CM

## 2023-09-15 DIAGNOSIS — R2681 Unsteadiness on feet: Secondary | ICD-10-CM

## 2023-09-15 DIAGNOSIS — R262 Difficulty in walking, not elsewhere classified: Secondary | ICD-10-CM

## 2023-09-15 DIAGNOSIS — R278 Other lack of coordination: Secondary | ICD-10-CM

## 2023-09-15 DIAGNOSIS — I69352 Hemiplegia and hemiparesis following cerebral infarction affecting left dominant side: Secondary | ICD-10-CM

## 2023-09-15 DIAGNOSIS — R269 Unspecified abnormalities of gait and mobility: Secondary | ICD-10-CM

## 2023-09-15 DIAGNOSIS — R208 Other disturbances of skin sensation: Secondary | ICD-10-CM

## 2023-09-15 NOTE — Therapy (Unsigned)
 OUTPATIENT PHYSICAL THERAPY NEURO TREATMENT     Patient Name: George Bruce MRN: [id number] DOB:01/17/1946, 78 y.o., male Today's Date: 09/15/2023   PCP: Auston Reyes BIRCH, MD  REFERRING PROVIDER: Auston Reyes BIRCH, MD   END OF SESSION:   PT End of Session - 09/15/23 1004     Visit Number 13    Number of Visits 24    Date for PT Re-Evaluation 10/22/23    Progress Note Due on Visit 10    PT Start Time 1015    PT Stop Time 1055    PT Time Calculation (min) 40 min    Equipment Utilized During Treatment Gait belt    Activity Tolerance Patient tolerated treatment well    Behavior During Therapy WFL for tasks assessed/performed            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   Hypertension    Wears dentures    partial upper and lower   Past Surgical History:  Procedure Laterality Date   CATARACT EXTRACTION W/PHACO Right 09/29/2022   Procedure: CATARACT EXTRACTION PHACO AND INTRAOCULAR LENS PLACEMENT (IOC) RIGHT  10.98  00:56.5;  Surgeon: Myrna Adine Anes, MD;  Location: [facility];  Service: Ophthalmology;  Laterality: Right;   CATARACT EXTRACTION W/PHACO Left 10/13/2022   Procedure: CATARACT EXTRACTION PHACO AND INTRAOCULAR LENS PLACEMENT (IOC) LEFT 10.79 00:54.1;  Surgeon: Myrna Adine Anes, MD;  Location: [facility];  Service: Ophthalmology;  Laterality: Left;   COLONOSCOPY WITH PROPOFOL  N/A 04/21/2018   Procedure: COLONOSCOPY WITH PROPOFOL ;  Surgeon: Janalyn Keene NOVAK, MD;  Location: [facility];  Service: Endoscopy;  Laterality: N/A;   INGUINAL HERNIA REPAIR Left 1990's   INGUINAL HERNIA REPAIR Right 2015   REVERSE SHOULDER ARTHROPLASTY Left 11/06/2015   Procedure: REVERSE SHOULDER ARTHROPLASTY;  Surgeon: Norleen JINNY Maltos, MD;  Location: [facility];  Service: Orthopedics;  Laterality: Left;   REVERSE SHOULDER ARTHROPLASTY Right 03/25/2022   Procedure: REVERSE SHOULDER ARTHROPLASTY;  Surgeon: Maltos Norleen JINNY, MD;  Location: [facility];  Service: Orthopedics;  Laterality: Right;  MAKE 2ND CASE   Patient Active Problem List   Diagnosis Date Noted   Acute CVA (cerebrovascular accident) (HCC) 07/21/2023   Elevated blood pressure reading 07/21/2023   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   Acute diverticulitis 05/07/2018   History of colonic polyps    Benign neoplasm of ascending colon    Diverticulosis of large intestine without diverticulitis    Erectile dysfunction due to arterial insufficiency 02/18/2018   Degenerative disc disease, lumbar 08/26/2016   Bilateral hip pain 06/24/2016   Chronic midline low back pain 06/24/2016   Polyarthralgia 06/24/2016   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   Hematospermia 05/03/2015   Colon polyps 08/16/2013   Hematuria 08/16/2013   Hyperlipidemia 08/16/2013   Incomplete emptying of bladder 06/23/2013   Benign prostatic hyperplasia with incomplete bladder emptying 11/11/2011   Elevated PSA 11/11/2011   Reduced libido 11/11/2011    ONSET DATE: 07/20/2023 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, right PCA occlusion   REFERRING DIAG: I63.50 (ICD-10-CM) - Cerebral infarction due to unspecified occlusion or stenosis of  unspecified cerebral artery   THERAPY DIAG:  Unsteadiness on feet  Other lack of coordination  Difficulty in walking, not elsewhere classified  Muscle weakness (generalized)  Abnormality of gait  Other disturbances of skin sensation  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 No other issues or medical changes.     Pt states he is still taking tramadol in morning and at night, which is keeping his sciatic pain under control.  No reports of pain during session and no reports of falls.  Accompanied by self today    From Initial Eval: Pt states he has hx of sciatica in L LE. Pt states recent CVA affected his L side and he is having numbness in L UE and LE. Pt states the CVA affected him from his jaw down through his L arm and L LE. Pt states he is left hand dominant. Pt states he was walking ~52miles daily and performing all household chores prior to CVA. Patient states he was very active and completely independent prior.   Pt states he had MRI yesterday for L LE sciatica and is going to see MD tomorrow to follow-up on results (with [facility]). Patient reporting current pain as 6/10 with it being greater this morning, but he took pain medication to ease it off prior to coming to therapy.   Pt accompanied by: significant other, Wife, [name]  PERTINENT HISTORY: PMH of Hyperlipidemia, BPH, HTN, hx of basal cell carcinoma, hx of squamous cell carcinoma, arthritis, R and L reverse shoulder arthroplasties (R in 2024 and L in 2017)  PAIN:  Are you having pain? Yes: NPRS scale: 6/10 after taking pain medication this AM Pain location: L LE sciatica Pain description: severe ache Aggravating factors: he can wake up with it, no specific aggravating factors Relieving factors: pt reports having to take pain medication (hydrocodone ) this AM, hx of having injections and prednisone ; uses horse liniment   PRECAUTIONS: Fall  RED FLAGS: None   WEIGHT BEARING RESTRICTIONS: No  FALLS: Has patient fallen in last 6 months? No and but states  had a few stumbles getting his feet tangled up  LIVING ENVIRONMENT: Lives with: lives with their spouse Lives in: House/apartment Stairs: Yes: External: 3+1 steps;  on right going up; 1 level house Has following equipment at home: Walker - 2 wheeled and built-in shower seat  PLOF: Independent, Independent with household mobility without device, Independent with homemaking with ambulation, Independent with gait, and Leisure: golf  PATIENT GOALS: get back to half normal being able to do yard work, wash a car, and golf  OBJECTIVE:  Note: Objective measures were completed at Evaluation unless otherwise noted.  DIAGNOSTIC FINDINGS:  EXAM: MRI HEAD WITHOUT CONTRAST   TECHNIQUE: Multiplanar, multiecho pulse sequences of the brain and surrounding structures were obtained without intravenous contrast.   COMPARISON:  CT head, CTA, CTP yesterday.  Brain MRI 02/22/2008.  IMPRESSION: 1. Positive for Acute lacunar infarct of the lateral Right Thalamus, but also abnormal diffusion throughout the right hippocampal formation. Both are Right PCA territory, although superimposed seizure related changes of the right hippocampus would be difficult to exclude. No associated hemorrhage or mass effect.   2. 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 No other acute intracranial abnormality identified.   Electronically Signed   By: VEAR Hurst M.D.   On: 07/21/2023 05:51  CTA head:   1. 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 There is some reconstitution of enhancement within right posterior cerebral artery branches beginning at the distal P2 segment level. 2. 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 3.  Atherosclerotic plaque within the intracranial internal carotid arteries with no more than mild stenosis.     Electronically Signed   By: Rockey Childs D.O.   On: 07/20/2023 19:32   COGNITION: Overall cognitive status: Within functional limits for tasks assessed   SENSATION: Light touch: Impaired  and basically absent Proprioception: Impaired  Can only feel deep pressure in L LE  COORDINATION: Incoordinated in L LE, likely due to sensory impairments  EDEMA:  Not formally assessed  MUSCLE TONE: Not formally assessed  MUSCLE LENGTH: Not formally assessed  DTRs:  Not formally assessed  POSTURE: rounded shoulders and forward head  LOWER EXTREMITY ROM:     Active  Right Eval  WFL/WNL throughout Left Eval  WFL/WNL throughout  Hip flexion    Hip extension    Hip abduction    Hip adduction    Hip internal rotation    Hip external rotation    Knee flexion    Knee extension    Ankle dorsiflexion    Ankle plantarflexion    Ankle inversion    Ankle eversion     (Blank rows = not tested)  LOWER EXTREMITY MMT:    MMT Right Eval Left Eval  Hip flexion 4+ 3+  Hip extension    Hip abduction    Hip adduction    Hip internal rotation    Hip external rotation    Knee flexion 4+ 4-  Knee extension 4+ 4+  Ankle dorsiflexion 4+ 3  Ankle plantarflexion 4+ 4-  Ankle inversion    Ankle eversion    (Blank rows = not tested)  Manual Muscle Test Scale 0/5 = No muscle contraction can be seen or felt 1/5 = Contraction can be felt, but there is no motion 2-/5 = Part moves through incomplete ROM w/ gravity decreased 2/5 = Part moves through complete ROM w/ gravity decreased  2+/5 = Part moves through incomplete ROM (<50%) against gravity or through complete ROM w/ gravity 3-/5 = Part moves through incomplete ROM (>50%) against gravity 3/5 = Part moves through complete ROM against gravity 3+/5 = Part moves through complete ROM against gravity/slight resistance 4-/5=  Holds test position against slight to moderate pressure 4/5 = Part moves through complete ROM against gravity/moderate resistance 4+/5= Holds test position against moderate to strong pressure 5/5 = Part moves through complete ROM against gravity/full resistance  BED MOBILITY:  Not tested, pt denies difficulty with this  TRANSFERS: Sit to stand: SBA and CGA  Assistive device utilized: Environmental consultant - 2 wheeled and None     Stand to sit: SBA and CGA  Assistive device utilized: Environmental consultant - 2 wheeled and None     Chair to chair: SBA and CGA  Assistive device utilized: Environmental consultant - 2 wheeled and None      *requires CGA when not using AD* RAMP:  Not tested  CURB:  Not tested  STAIRS: Findings: Level of Assistance: CGA and Min A, Stair Negotiation Technique: Step to Pattern Forwards with Single Rail on Right, Number of Stairs: 4, Height of Stairs: 6in   , and Comments: incoordinated, but no greater than min A for balance; only used R HR to simulate home environment GAIT: Findings: Gait Characteristics: step through pattern, decreased arm swing- Right, decreased arm swing- Left, decreased step length- Right, decreased step length- Left, decreased stride length, and wide BOS, Distance walked: 60ft + 317ft, Assistive device utilized:None, Level of assistance: CGA and Min A, and Comments: pt uses RW for all ambulation at home, but didn't use AD during session to assess patient's gait without  FUNCTIONAL TESTS:  5 times sit to stand: 15.12 seconds with arms across chest  6 minute walk test: need to assess 10 meter walk test: 0.93 m/s without AD with CGA for safety  Berg Balance Scale: need to assess Functional gait assessment: need to assess   PATIENT SURVEYS:  Stroke Impact Scale 35/80                                                                                                                              TREATMENT DATE: 09/15/2023  Pt arrives to clinic ambulating with SPC.   Unless otherwise  stated, CGA was provided and gait belt donned in order to ensure pt safety throughout session.  B UE and B LE reciprocal movement pattern on nustep level 1-4 x 6 min for cardiovascular training and B UE & LE functional strengthening   Gait without AD through hall x 332ft. No LOB noted.  Gait through hall with head turns to identify letters on wall 2 x 13ft.  Forward 83ft / reverse 34ft gait no AD x 110ft total.  Side stepping 76ft x 2 bil with cues erect posture and symmetry of step width. No overt LOB, but mild stutter step when transitioning directions.   Standing on airex  beam to place and hit 6 golf balls x 3 bouts to target. Forward reach with putter with CGA assist overall with cues for hip hinge to obtain full ROM.   Sit<>stand with overhead  UE therapy ball press (3KG)  2 x 10    PATIENT EDUCATION: Education details:Pt educated throughout session about proper posture and technique with exercises. Improved exercise technique, movement at target joints, use of target muscles after min to mod verbal, visual, tactile cues.  Person educated: Patient Education method: Explanation Education comprehension: verbalized understanding and needs further education  HOME EXERCISE PROGRAM:  Access Code: [email or web address] URL: [email or web address] Date: 08/06/2023 Prepared by: Lonni Gainer  Exercises - Standing March with Unilateral Counter Support  - 1 x daily - 7 x weekly - 2 sets - 10 reps - Sit to Stand Without Arm Support  - 1 x daily - 7 x weekly - 2 sets - 10 reps - Side Stepping with Counter Support  - 1 x daily - 7 x weekly - 3 sets - 10 reps   GOALS: Goals reviewed with patient? Yes  SHORT TERM GOALS: Target date: 09/10/2023  Patient will be independent in home exercise program to improve strength/mobility for better functional independence with ADLs and functional mobility.  Baseline: provided on 5/29  Goal status: In progress   LONG TERM GOALS: Target date:  10/22/2023  Patient (> 62 years old) will complete five times sit to stand test (5XSTS) in < 12 seconds indicating an increased LE strength and improved balance.  Baseline: 15.12 seconds with arms across chest 6/24: 11.9 sec no UE support  Goal status: INITIAL  2.  Patient will increase Berg Balance score to > 51/56 to demonstrate improved balance and decreased fall risk during functional activities and ADLs.  Baseline: 38/56 6/24: 44/56 Goal status: INITIAL  3.  Patient will increase six minute walk test ( ) distance to >1062ft for progression to community ambulator and improve gait ability  Baseline: 1,027 feet (313 meters, Avg speed 0.823m/s), no AD, with CGA and occasional min A 6/24: 1168ft no  Goal status: INITIAL  4.  Patient will increase 10 meter walk test to >1.13m/s as to improve gait speed for better community ambulation and to reduce fall risk. Baseline: 0.93 m/s without AD with CGA  6/24: 0.61ms  Goal status: INITIAL  5.  Patient will increase Functional Gait Assessment (FGA) score to >20/30 as to reduce fall risk and improve dynamic gait safety with community ambulation.  Baseline: 5/30 Deferred due to time Goal status: INITIAL  6.  Patient will improve Stroke Impact Scale-16 by >9 points indicating patient experiencing increased independence with functional mobility tasks and ADLs to improve quality of life and decrease caregiver burden.  Baseline: 35/80 6/24: 69/80 Goal status: INITIAL  ASSESSMENT:  CLINICAL IMPRESSION:  Patient arrived motivated to participate in therapy and reports the restlessness in his LEs is a little worse today. PT treatment including baseline recreational activity of golf while engaging postural and dynamic balance control from unstable surface. Continued dynamic gait training in various directions for improved stepping and righting reactions. Pt will continue to benefit from skilled physical therapy interventions to address impairments,  improve QOL, and attain therapy goals.    OBJECTIVE IMPAIRMENTS: Abnormal gait, decreased activity tolerance, decreased balance, decreased coordination, decreased endurance, decreased knowledge of condition, decreased knowledge of use of DME, decreased mobility, difficulty walking, decreased strength, increased fascial restrictions, impaired flexibility, impaired sensation, impaired UE functional use, improper body mechanics, and pain.  ACTIVITY LIMITATIONS: carrying, lifting, bending, sitting, standing, squatting, stairs, transfers, bathing, toileting, dressing, reach over head, hygiene/grooming, and locomotion level  PARTICIPATION LIMITATIONS: meal prep, cleaning, laundry, interpersonal relationship, driving, shopping, community activity, and yard work  PERSONAL FACTORS: Age, Fitness, Time since onset of injury/illness/exacerbation, and 3+ comorbidities: Hyperlipidemia, BPH, HTN, hx of basal cell carcinoma, hx of squamous cell carcinoma, arthritis, R and L reverse shoulder arthroplasties (R in 2024 and L in 2017) are also affecting patient's functional outcome.   REHAB POTENTIAL: Excellent  CLINICAL DECISION MAKING: Evolving/moderate complexity  EVALUATION COMPLEXITY: Moderate  PLAN:  PT FREQUENCY: 1-2x/week  PT DURATION: 12 weeks  PLANNED INTERVENTIONS: [id number]- PT Re-evaluation, 97750- Physical Performance Testing, 97110-Therapeutic exercises, 97530- Therapeutic activity, 97112- Neuromuscular re-education, 97535- Self Care, [id number]- Manual therapy, [phone]- Gait training, [phone]- Orthotic Initial, [phone]- Orthotic/Prosthetic subsequent, [phone]- Canalith repositioning, [phone]- Electrical stimulation (manual), Patient/Family education, Balance training, Stair training, Taping, Dry Needling, Joint mobilization, Joint manipulation, Spinal mobilization, Vestibular training, Visual/preceptual remediation/compensation, DME instructions, Cryotherapy, Moist heat, and Biofeedback  PLAN FOR NEXT SESSION:   - participate in higher level dynamic balance and gait challenges including:  - horizontal and vertical head turns  - stepping towards external targets due to LLE incoordination  - gait in variable directions  - unstable surfaces - step-ups for B LE strengthening with simultaneous dynamic balance tasks - progress to practicing golf swing for return to recreational activities   Massie Dollar PT, DPT  Physical Therapist - [facility]  [facility]  8:22 AM 09/16/23

## 2023-09-17 ENCOUNTER — Ambulatory Visit: Admitting: Physical Therapy

## 2023-09-17 DIAGNOSIS — R2681 Unsteadiness on feet: Secondary | ICD-10-CM | POA: Diagnosis not present

## 2023-09-17 DIAGNOSIS — R269 Unspecified abnormalities of gait and mobility: Secondary | ICD-10-CM

## 2023-09-17 DIAGNOSIS — M6281 Muscle weakness (generalized): Secondary | ICD-10-CM

## 2023-09-17 DIAGNOSIS — I69352 Hemiplegia and hemiparesis following cerebral infarction affecting left dominant side: Secondary | ICD-10-CM

## 2023-09-17 DIAGNOSIS — R278 Other lack of coordination: Secondary | ICD-10-CM

## 2023-09-17 DIAGNOSIS — R262 Difficulty in walking, not elsewhere classified: Secondary | ICD-10-CM

## 2023-09-17 NOTE — Therapy (Signed)
 OUTPATIENT PHYSICAL THERAPY NEURO TREATMENT     Patient Name: George Bruce MRN: [id number] DOB:May 15, 1945, 78 y.o., male Today's Date: 09/17/2023   PCP: Auston Reyes BIRCH, MD  REFERRING PROVIDER: Auston Reyes BIRCH, MD   END OF SESSION:   PT End of Session - 09/17/23 0805     Visit Number 14    Number of Visits 24    Date for PT Re-Evaluation 10/22/23    Progress Note Due on Visit 10    PT Start Time 0801    PT Stop Time 0841    PT Time Calculation (min) 40 min    Equipment Utilized During Treatment Gait belt    Activity Tolerance Patient tolerated treatment well    Behavior During Therapy WFL for tasks assessed/performed            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;  Service: Ophthalmology;  Laterality: Right;   CATARACT EXTRACTION W/PHACO Left 10/13/2022   Procedure: CATARACT EXTRACTION PHACO AND INTRAOCULAR LENS PLACEMENT (IOC) LEFT 10.79 00:54.1;  Surgeon: Myrna Adine Anes, MD;  Location: [facility];  Service: Ophthalmology;  Laterality: Left;   COLONOSCOPY WITH PROPOFOL  N/A 04/21/2018   Procedure: COLONOSCOPY WITH PROPOFOL ;  Surgeon: Janalyn Keene NOVAK, MD;  Location: [facility];  Service: Endoscopy;  Laterality: N/A;   INGUINAL HERNIA REPAIR Left 1990's   INGUINAL HERNIA REPAIR Right 2015   REVERSE SHOULDER ARTHROPLASTY Left 11/06/2015   Procedure: REVERSE SHOULDER ARTHROPLASTY;  Surgeon: Norleen JINNY Maltos, MD;  Location: [facility];  Service: Orthopedics;  Laterality: Left;   REVERSE SHOULDER ARTHROPLASTY Right 03/25/2022   Procedure: REVERSE SHOULDER ARTHROPLASTY;  Surgeon: Maltos Norleen JINNY, MD;  Location: [facility];  Service: Orthopedics;  Laterality: Right;  MAKE 2ND CASE   Patient Active Problem List   Diagnosis Date Noted   Acute CVA (cerebrovascular accident) (HCC) 07/21/2023   Elevated blood pressure reading 07/21/2023   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   Acute diverticulitis 05/07/2018   History of colonic polyps    Benign neoplasm of ascending colon    Diverticulosis of large intestine without diverticulitis    Erectile dysfunction due to arterial insufficiency 02/18/2018   Degenerative disc disease, lumbar 08/26/2016   Bilateral hip pain 06/24/2016   Chronic midline low back pain 06/24/2016   Polyarthralgia 06/24/2016   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   Hematospermia 05/03/2015   Colon polyps 08/16/2013   Hematuria 08/16/2013   Hyperlipidemia 08/16/2013   Incomplete emptying of bladder 06/23/2013   Benign prostatic hyperplasia with incomplete bladder emptying 11/11/2011   Elevated PSA 11/11/2011   Reduced libido 11/11/2011    ONSET DATE: 07/20/2023 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, right PCA occlusion   REFERRING DIAG: I63.50 (ICD-10-CM) - Cerebral infarction due to unspecified occlusion or stenosis of  unspecified cerebral artery   THERAPY DIAG:  Unsteadiness on feet  Other lack of coordination  Difficulty in walking, not elsewhere classified  Muscle weakness (generalized)  Abnormality of gait  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 No other issues or medical changes.     Pt states he is still taking tramadol in morning and at night, which is keeping his sciatic pain under control.  No reports of pain during session and no reports of falls.  Accompanied by self today    From Initial Eval: Pt states he has hx of sciatica in L LE. Pt states recent CVA affected his L side and he is having numbness in L UE and LE. Pt states the CVA affected him from his jaw down through his L arm and L LE. Pt states he is left hand dominant. Pt states he was walking ~24miles daily and performing all household chores prior to CVA. Patient states he was very active and completely independent prior.   Pt states he had MRI yesterday for L LE sciatica and is going to see MD tomorrow to follow-up on results (with [facility]). Patient reporting current pain as 6/10 with it being greater this morning, but he took pain medication to ease it off prior to coming to therapy.   Pt accompanied by: significant other, Wife, [name]  PERTINENT HISTORY: PMH of Hyperlipidemia, BPH, HTN, hx of basal cell carcinoma, hx of squamous cell carcinoma, arthritis, R and L reverse shoulder arthroplasties (R in 2024 and L in 2017)  PAIN:  Are you having pain? Yes: NPRS scale: 6/10 after taking pain medication this AM Pain location: L LE sciatica Pain description: severe ache Aggravating factors: he can wake up with it, no specific aggravating factors Relieving factors: pt reports having to take pain medication (hydrocodone ) this AM, hx of having injections and prednisone ; uses horse liniment   PRECAUTIONS: Fall  RED FLAGS: None   WEIGHT BEARING RESTRICTIONS: No  FALLS: Has patient fallen in last 6 months? No and but states had a few stumbles getting his feet  tangled up  LIVING ENVIRONMENT: Lives with: lives with their spouse Lives in: House/apartment Stairs: Yes: External: 3+1 steps; on right going up; 1 level  house Has following equipment at home: Vannie - 2 wheeled and built-in shower seat  PLOF: Independent, Independent with household mobility without device, Independent with homemaking with ambulation, Independent with gait, and Leisure: golf  PATIENT GOALS: get back to half normal being able to do yard work, wash a car, and golf  OBJECTIVE:  Note: Objective measures were completed at Evaluation unless otherwise noted.  DIAGNOSTIC FINDINGS:  EXAM: MRI HEAD WITHOUT CONTRAST   TECHNIQUE: Multiplanar, multiecho pulse sequences of the brain and surrounding structures were obtained without intravenous contrast.   COMPARISON:  CT head, CTA, CTP yesterday.  Brain MRI 02/22/2008.  IMPRESSION: 1. Positive for Acute lacunar infarct of the lateral Right Thalamus, but also abnormal diffusion throughout the right hippocampal formation. Both are Right PCA territory, although superimposed seizure related changes of the right hippocampus would be difficult to exclude. No associated hemorrhage or mass effect.   2. 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 No other acute intracranial abnormality identified.   Electronically Signed   By: VEAR Hurst M.D.   On: 07/21/2023 05:51  CTA head:   1. 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 There is some reconstitution of enhancement within right posterior cerebral artery branches beginning at the distal P2 segment level. 2. 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 3. Atherosclerotic plaque within the intracranial  internal carotid arteries with no more than mild stenosis.     Electronically Signed   By: Rockey Childs D.O.   On: 07/20/2023 19:32   COGNITION: Overall cognitive status: Within functional limits for tasks assessed   SENSATION: Light touch: Impaired  and basically absent Proprioception: Impaired  Can only feel deep pressure in L LE  COORDINATION: Incoordinated in L LE, likely due to sensory impairments  EDEMA:  Not formally assessed  MUSCLE TONE: Not formally assessed  MUSCLE LENGTH: Not formally assessed  DTRs:  Not formally assessed  POSTURE: rounded shoulders and forward head  LOWER EXTREMITY ROM:     Active  Right Eval  WFL/WNL throughout Left Eval  WFL/WNL throughout  Hip flexion    Hip extension    Hip abduction    Hip adduction    Hip internal rotation    Hip external rotation    Knee flexion    Knee extension    Ankle dorsiflexion    Ankle plantarflexion    Ankle inversion    Ankle eversion     (Blank rows = not tested)  LOWER EXTREMITY MMT:    MMT Right Eval Left Eval  Hip flexion 4+ 3+  Hip extension    Hip abduction    Hip adduction    Hip internal rotation    Hip external rotation    Knee flexion 4+ 4-  Knee extension 4+ 4+  Ankle dorsiflexion 4+ 3  Ankle plantarflexion 4+ 4-  Ankle inversion    Ankle eversion    (Blank rows = not tested)  Manual Muscle Test Scale 0/5 = No muscle contraction can be seen or felt 1/5 = Contraction can be felt, but there is no motion 2-/5 = Part moves through incomplete ROM w/ gravity decreased 2/5 = Part moves through complete ROM w/ gravity decreased 2+/5 = Part moves through incomplete  ROM (<50%) against gravity or through complete ROM w/ gravity 3-/5 = Part moves through incomplete ROM (>50%) against gravity 3/5 = Part moves through complete ROM against gravity 3+/5 = Part moves through complete ROM against gravity/slight resistance 4-/5= Holds test position against slight to moderate  pressure 4/5 = Part moves through complete ROM against gravity/moderate resistance 4+/5= Holds test position against moderate to strong pressure 5/5 = Part moves through complete ROM against gravity/full resistance  BED MOBILITY:  Not tested, pt denies difficulty with this  TRANSFERS: Sit to stand: SBA and CGA  Assistive device utilized: Environmental consultant - 2 wheeled and None     Stand to sit: SBA and CGA  Assistive device utilized: Environmental consultant - 2 wheeled and None     Chair to chair: SBA and CGA  Assistive device utilized: Environmental consultant - 2 wheeled and None      *requires CGA when not using AD* RAMP:  Not tested  CURB:  Not tested  STAIRS: Findings: Level of Assistance: CGA and Min A, Stair Negotiation Technique: Step to Pattern Forwards with Single Rail on Right, Number of Stairs: 4, Height of Stairs: 6in   , and Comments: incoordinated, but no greater than min A for balance; only used R HR to simulate home environment GAIT: Findings: Gait Characteristics: step through pattern, decreased arm swing- Right, decreased arm swing- Left, decreased step length- Right, decreased step length- Left, decreased stride length, and wide BOS, Distance walked: 57ft + 341ft, Assistive device utilized:None, Level of assistance: CGA and Min A, and Comments: pt uses RW for all ambulation at home, but didn't use AD during session to assess patient's gait without  FUNCTIONAL TESTS:  5 times sit to stand: 15.12 seconds with arms across chest  6 minute walk test: need to assess 10 meter walk test: 0.93 m/s without AD with CGA for safety  Berg Balance Scale: need to assess Functional gait assessment: need to assess   PATIENT SURVEYS:  Stroke Impact Scale 35/80                                                                                                                              TREATMENT DATE: 09/17/2023  Pt arrives to clinic ambulating with SPC.   Unless otherwise stated, CGA was provided and gait belt donned in  order to ensure pt safety throughout session.  Octane reciprocal movement training x 6 min, random resistance.   Weighted gait with 4# AW and 2.5# [employer] 2 x 466ft. Cues for improved reciprocal UE swing. Additional weighted gait 4# AW only x 450   Reciprocal forward step over cane with reciprocal UE swing x 25 bil with tactile cues for proper UE swing. Performed with AW and [employer] as listed above  BLE stepping over cane forward/reverse x 12 bil with cues for proper step length in each direction performed with AW and WW as listed above  Lateral stepping BLE over cane with 4# AW x  15 bil  Multiple mild posterior or lateral LOB with stepping task over obstacle, using stepping strategy to correct LOB initially, but improved to perform use of ankle strategy with instruction from    Cues for improved intentional movement and awareness of LLE and LUE with dynamic movement training which  reduced dyskinesia on this day. Was noted to have wide BOS with weighted gait that improve slightly with instruction as well as difficulty with symmetry of step length over can initially, but also improved with repetitions     PATIENT EDUCATION: Education details:Pt educated throughout session about proper posture and technique with exercises. Improved exercise technique, movement at target joints, use of target muscles after min to mod verbal, visual, tactile cues.  S/s expected with PCA CVA.   Person educated: Patient Education method: Explanation Education comprehension: verbalized understanding and needs further education  HOME EXERCISE PROGRAM:  Access Code: [email or web address] URL: [email or web address] Date: 08/06/2023 Prepared by: Lonni Gainer  Exercises - Standing March with Unilateral Counter Support  - 1 x daily - 7 x weekly - 2 sets - 10 reps - Sit to Stand Without Arm Support  - 1 x daily - 7 x weekly - 2 sets - 10 reps - Side Stepping with Counter Support  - 1 x daily - 7 x weekly - 3 sets -  10 reps   GOALS: Goals reviewed with patient? Yes  SHORT TERM GOALS: Target date: 09/10/2023  Patient will be independent in home exercise program to improve strength/mobility for better functional independence with ADLs and functional mobility.  Baseline: provided on 5/29  Goal status: In progress   LONG TERM GOALS: Target date: 10/22/2023  Patient (> 38 years old) will complete five times sit to stand test (5XSTS) in < 12 seconds indicating an increased LE strength and improved balance.  Baseline: 15.12 seconds with arms across chest 6/24: 11.9 sec no UE support  Goal status: INITIAL  2.  Patient will increase Berg Balance score to > 51/56 to demonstrate improved balance and decreased fall risk during functional activities and ADLs.  Baseline: 38/56 6/24: 44/56 Goal status: INITIAL  3.  Patient will increase six minute walk test ( ) distance to >1060ft for progression to community ambulator and improve gait ability  Baseline: 1,027 feet (313 meters, Avg speed 0.8108m/s), no AD, with CGA and occasional min A 6/24: 1158ft no  Goal status: INITIAL  4.  Patient will increase 10 meter walk test to >1.3m/s as to improve gait speed for better community ambulation and to reduce fall risk. Baseline: 0.93 m/s without AD with CGA  6/24: 0.50ms  Goal status: INITIAL  5.  Patient will increase Functional Gait Assessment (FGA) score to >20/30 as to reduce fall risk and improve dynamic gait safety with community ambulation.  Baseline: 5/30 Deferred due to time Goal status: INITIAL  6.  Patient will improve Stroke Impact Scale-16 by >9 points indicating patient experiencing increased independence with functional mobility tasks and ADLs to improve quality of life and decrease caregiver burden.  Baseline: 35/80 6/24: 69/80 Goal status: INITIAL  ASSESSMENT:  CLINICAL IMPRESSION:  Patient arrived motivated to participate in therapy and reports the restlessness in his LEs is a little  worse today. PT treatment focused on functional mobility with added resistance to improve proprioception with prolonged distances and obstacle navigation. Noted to have improved step width and reciprocal pattern with increased repetition and instruction from PT.  Pt will continue to benefit from skilled physical therapy interventions to address impairments,  improve QOL, and attain therapy goals.    OBJECTIVE IMPAIRMENTS: Abnormal gait, decreased activity tolerance, decreased balance, decreased coordination, decreased endurance, decreased knowledge of condition, decreased knowledge of use of DME, decreased mobility, difficulty walking, decreased strength, increased fascial restrictions, impaired flexibility, impaired sensation, impaired UE functional use, improper body mechanics, and pain.   ACTIVITY LIMITATIONS: carrying, lifting, bending, sitting, standing, squatting, stairs, transfers, bathing, toileting, dressing, reach over head, hygiene/grooming, and locomotion level  PARTICIPATION LIMITATIONS: meal prep, cleaning, laundry, interpersonal relationship, driving, shopping, community activity, and yard work  PERSONAL FACTORS: Age, Fitness, Time since onset of injury/illness/exacerbation, and 3+ comorbidities: Hyperlipidemia, BPH, HTN, hx of basal cell carcinoma, hx of squamous cell carcinoma, arthritis, R and L reverse shoulder arthroplasties (R in 2024 and L in 2017) are also affecting patient's functional outcome.   REHAB POTENTIAL: Excellent  CLINICAL DECISION MAKING: Evolving/moderate complexity  EVALUATION COMPLEXITY: Moderate  PLAN:  PT FREQUENCY: 1-2x/week  PT DURATION: 12 weeks  PLANNED INTERVENTIONS: [id number]- PT Re-evaluation, 97750- Physical Performance Testing, 97110-Therapeutic exercises, 97530- Therapeutic activity, 97112- Neuromuscular re-education, 97535- Self Care, [id number]- Manual therapy, [phone]- Gait training, [phone]- Orthotic Initial, [phone]- Orthotic/Prosthetic subsequent, [phone]-  Canalith repositioning, [phone]- Electrical stimulation (manual), Patient/Family education, Balance training, Stair training, Taping, Dry Needling, Joint mobilization, Joint manipulation, Spinal mobilization, Vestibular training, Visual/preceptual remediation/compensation, DME instructions, Cryotherapy, Moist heat, and Biofeedback  PLAN FOR NEXT SESSION:  - participate in higher level dynamic balance and gait challenges including:  - horizontal and vertical head turns  - stepping towards external targets due to LLE incoordination  - gait in variable directions  - unstable surfaces - step-ups for B LE strengthening with simultaneous dynamic balance tasks - progress to practicing golf swing for return to recreational activities   Massie Dollar PT, DPT  Physical Therapist - [facility]  [facility]  8:06 AM 09/17/23

## 2023-09-22 ENCOUNTER — Ambulatory Visit: Admitting: Physical Therapy

## 2023-09-22 DIAGNOSIS — R278 Other lack of coordination: Secondary | ICD-10-CM

## 2023-09-22 DIAGNOSIS — R2681 Unsteadiness on feet: Secondary | ICD-10-CM | POA: Diagnosis not present

## 2023-09-22 DIAGNOSIS — I69352 Hemiplegia and hemiparesis following cerebral infarction affecting left dominant side: Secondary | ICD-10-CM

## 2023-09-22 DIAGNOSIS — M6281 Muscle weakness (generalized): Secondary | ICD-10-CM

## 2023-09-22 DIAGNOSIS — R269 Unspecified abnormalities of gait and mobility: Secondary | ICD-10-CM

## 2023-09-22 DIAGNOSIS — R262 Difficulty in walking, not elsewhere classified: Secondary | ICD-10-CM

## 2023-09-22 NOTE — Therapy (Signed)
 OUTPATIENT PHYSICAL THERAPY NEURO TREATMENT     Patient Name: George Bruce MRN: [id number] DOB:1945-05-24, 78 y.o., male Today's Date: 09/22/2023   PCP: Auston Reyes BIRCH, MD  REFERRING PROVIDER: Auston Reyes BIRCH, MD   END OF SESSION:   PT End of Session - 09/22/23 0945     Visit Number 15    Number of Visits 24    Date for PT Re-Evaluation 10/22/23    Progress Note Due on Visit 10    PT Start Time 0935    PT Stop Time 1015    PT Time Calculation (min) 40 min    Equipment Utilized During Treatment Gait belt    Activity Tolerance Patient tolerated treatment well    Behavior During Therapy WFL for tasks assessed/performed            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   Hypertension    Wears dentures    partial upper and lower   Past Surgical History:  Procedure Laterality Date   CATARACT EXTRACTION W/PHACO Right 09/29/2022   Procedure: CATARACT EXTRACTION PHACO AND INTRAOCULAR LENS PLACEMENT (IOC) RIGHT  10.98  00:56.5;  Surgeon: Myrna Adine Anes, MD;  Location: [facility];  Service: Ophthalmology;  Laterality: Right;   CATARACT EXTRACTION W/PHACO Left 10/13/2022   Procedure: CATARACT EXTRACTION PHACO AND INTRAOCULAR LENS PLACEMENT (IOC) LEFT 10.79 00:54.1;  Surgeon: Myrna Adine Anes, MD;  Location: [facility];  Service: Ophthalmology;  Laterality: Left;   COLONOSCOPY WITH PROPOFOL  N/A 04/21/2018   Procedure: COLONOSCOPY WITH PROPOFOL ;  Surgeon: Janalyn Keene NOVAK, MD;  Location: [facility];  Service: Endoscopy;  Laterality: N/A;   INGUINAL HERNIA REPAIR Left 1990's   INGUINAL HERNIA REPAIR Right 2015   REVERSE SHOULDER ARTHROPLASTY Left 11/06/2015   Procedure: REVERSE SHOULDER ARTHROPLASTY;  Surgeon: Norleen JINNY Maltos, MD;  Location: [facility];  Service: Orthopedics;  Laterality: Left;   REVERSE SHOULDER ARTHROPLASTY Right 03/25/2022   Procedure: REVERSE SHOULDER ARTHROPLASTY;  Surgeon: Maltos Norleen JINNY, MD;  Location: [facility];  Service: Orthopedics;  Laterality: Right;  MAKE 2ND CASE   Patient Active Problem List   Diagnosis Date Noted   Acute CVA (cerebrovascular accident) (HCC) 07/21/2023   Elevated blood pressure reading 07/21/2023   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   Acute diverticulitis 05/07/2018   History of colonic polyps    Benign neoplasm of ascending colon    Diverticulosis of large intestine without diverticulitis    Erectile dysfunction due to arterial insufficiency 02/18/2018   Degenerative disc disease, lumbar 08/26/2016   Bilateral hip pain 06/24/2016   Chronic midline low back pain 06/24/2016   Polyarthralgia 06/24/2016   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   Hematospermia 05/03/2015   Colon polyps 08/16/2013   Hematuria 08/16/2013   Hyperlipidemia 08/16/2013   Incomplete emptying of bladder 06/23/2013   Benign prostatic hyperplasia with incomplete bladder emptying 11/11/2011   Elevated PSA 11/11/2011   Reduced libido 11/11/2011    ONSET DATE: 07/20/2023 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, right PCA occlusion   REFERRING DIAG: I63.50 (ICD-10-CM) - Cerebral infarction due to unspecified occlusion or stenosis of  unspecified cerebral artery   THERAPY DIAG:  Unsteadiness on feet  Other lack of coordination  Difficulty in walking, not elsewhere classified  Muscle weakness (generalized)  Abnormality of gait  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 States that when he woke up they tremors were not bad, but got worse when he was shaving this AM.   Also states that he had a near fall over the weekend slipping on gravy in [place], fell back into seat of car.    Pt states he is still taking tramadol in morning and at night, which is keeping his sciatic pain under control.  No reports of pain during session and no reports of falls.  Accompanied by self today    From Initial Eval: Pt states he has hx of sciatica in L LE. Pt states recent CVA affected his L side and he is having numbness in L UE and LE. Pt states the CVA affected him from his jaw down through his L arm and L LE. Pt states he is left hand dominant. Pt states he was walking ~16miles daily and performing all household chores prior to CVA. Patient states he was very active and completely independent prior.   Pt states he had MRI yesterday for L LE sciatica and is going to see MD tomorrow to follow-up on results (with [facility]). Patient reporting current pain as 6/10 with it being greater this morning, but he took pain medication to ease it off prior to coming to therapy.   Pt accompanied by: significant other, Wife, [name]  PERTINENT HISTORY: PMH of Hyperlipidemia, BPH, HTN, hx of basal cell carcinoma, hx of squamous cell carcinoma, arthritis, R and L reverse shoulder arthroplasties (R in 2024 and L in 2017)  PAIN:  Are you having pain? Yes: NPRS scale: 6/10 after taking pain medication this AM Pain location: L LE sciatica Pain description: severe ache Aggravating factors: he can wake up with it, no specific aggravating factors Relieving factors: pt reports having to take pain medication (hydrocodone ) this AM, hx of having injections and  prednisone ; uses horse liniment   PRECAUTIONS: Fall  RED FLAGS: None   WEIGHT BEARING RESTRICTIONS: No  FALLS: Has patient fallen in last 6 months? No and but states had a few stumbles getting his feet tangled up  LIVING ENVIRONMENT: Lives with: lives with their spouse Lives in: House/apartment Stairs: Yes: External: 3+1 steps; on right going up; 1 level house Has following equipment at home: Walker - 2 wheeled and built-in shower seat  PLOF: Independent, Independent with household mobility without device, Independent with homemaking with ambulation, Independent with gait, and Leisure: golf  PATIENT GOALS: get back to half normal being able to do yard work, wash a car, and golf  OBJECTIVE:  Note: Objective measures were completed at Evaluation unless otherwise noted.  DIAGNOSTIC FINDINGS:  EXAM: MRI HEAD WITHOUT CONTRAST   TECHNIQUE: Multiplanar, multiecho pulse sequences of the brain and surrounding structures were obtained without intravenous contrast.   COMPARISON:  CT head, CTA, CTP yesterday.  Brain MRI 02/22/2008.  IMPRESSION: 1. Positive for Acute lacunar infarct of the lateral Right Thalamus, but also abnormal diffusion throughout the right hippocampal formation. Both are Right PCA territory, although superimposed seizure related changes of the right hippocampus would be difficult to exclude. No associated hemorrhage or mass effect.   2. 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 No other acute intracranial abnormality identified.   Electronically Signed   By: VEAR Hurst M.D.   On: 07/21/2023 05:51  CTA head:   1. 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 There is some reconstitution of enhancement within right posterior cerebral artery branches  beginning at the distal P2 segment level. 2. 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 3. Atherosclerotic plaque within the intracranial internal carotid arteries with no more than mild stenosis.     Electronically Signed   By: Rockey Childs D.O.   On: 07/20/2023 19:32   COGNITION: Overall cognitive status: Within functional limits for tasks assessed   SENSATION: Light touch: Impaired  and basically absent Proprioception: Impaired  Can only feel deep pressure in L LE  COORDINATION: Incoordinated in L LE, likely due to sensory impairments  EDEMA:  Not formally assessed  MUSCLE TONE: Not formally assessed  MUSCLE LENGTH: Not formally assessed  DTRs:  Not formally assessed  POSTURE: rounded shoulders and forward head  LOWER EXTREMITY ROM:     Active  Right Eval  WFL/WNL throughout Left Eval  WFL/WNL throughout  Hip flexion    Hip extension    Hip abduction    Hip adduction    Hip internal rotation    Hip external rotation    Knee flexion    Knee extension    Ankle dorsiflexion    Ankle plantarflexion    Ankle inversion    Ankle eversion     (Blank rows = not tested)  LOWER EXTREMITY MMT:    MMT Right Eval Left Eval  Hip flexion 4+ 3+  Hip extension    Hip abduction    Hip adduction    Hip internal rotation    Hip external rotation    Knee flexion 4+ 4-  Knee extension 4+ 4+  Ankle dorsiflexion 4+ 3  Ankle plantarflexion 4+ 4-  Ankle inversion    Ankle eversion    (Blank rows = not tested)  Manual Muscle Test Scale 0/5 = No  muscle contraction can be seen or felt 1/5 = Contraction can be felt, but there is no motion 2-/5 = Part moves through incomplete ROM w/ gravity decreased 2/5 = Part moves through complete ROM w/ gravity decreased 2+/5 = Part moves through incomplete ROM (<50%) against gravity or through complete ROM w/ gravity 3-/5 = Part moves through  incomplete ROM (>50%) against gravity 3/5 = Part moves through complete ROM against gravity 3+/5 = Part moves through complete ROM against gravity/slight resistance 4-/5= Holds test position against slight to moderate pressure 4/5 = Part moves through complete ROM against gravity/moderate resistance 4+/5= Holds test position against moderate to strong pressure 5/5 = Part moves through complete ROM against gravity/full resistance  BED MOBILITY:  Not tested, pt denies difficulty with this  TRANSFERS: Sit to stand: SBA and CGA  Assistive device utilized: Environmental consultant - 2 wheeled and None     Stand to sit: SBA and CGA  Assistive device utilized: Environmental consultant - 2 wheeled and None     Chair to chair: SBA and CGA  Assistive device utilized: Environmental consultant - 2 wheeled and None      *requires CGA when not using AD* RAMP:  Not tested  CURB:  Not tested  STAIRS: Findings: Level of Assistance: CGA and Min A, Stair Negotiation Technique: Step to Pattern Forwards with Single Rail on Right, Number of Stairs: 4, Height of Stairs: 6in   , and Comments: incoordinated, but no greater than min A for balance; only used R HR to simulate home environment GAIT: Findings: Gait Characteristics: step through pattern, decreased arm swing- Right, decreased arm swing- Left, decreased step length- Right, decreased step length- Left, decreased stride length, and wide BOS, Distance walked: 21ft + 353ft, Assistive device utilized:None, Level of assistance: CGA and Min A, and Comments: pt uses RW for all ambulation at home, but didn't use AD during session to assess patient's gait without  FUNCTIONAL TESTS:  5 times sit to stand: 15.12 seconds with arms across chest  6 minute walk test: need to assess 10 meter walk test: 0.93 m/s without AD with CGA for safety  Berg Balance Scale: need to assess Functional gait assessment: need to assess   PATIENT SURVEYS:  Stroke Impact Scale 35/80                                                                                                                               TREATMENT DATE: 09/22/2023  Pt arrives to clinic ambulating with SPC.   Unless otherwise stated, CGA was provided and gait belt donned in order to ensure pt safety throughout session.  Octane reciprocal movement training x 6 min, random resistance. Cues for consistent SPM 45-55 min.     Weighted gait with 3# AW 2 x 331ft.  Cues posture and step length intermittently. 1 near LOB, but able to correct with stepping strategy   Foot tap on hedgehogs on 6 inch step with 3#AW ,  perform tap on 1 of 3 hedge hogs x 12 bil then 2 of 3 x 10 bil. Tactile cues for   improved posture and L side parascapular activation.   Side stepping up/down 6inch step with 3# AW with cues for posture and step width  x 10 bil   Forward/reverse ascend/descent on 6 inch step with 3# AW x 3 bil with RUE support x 3 bil without UE support. CGA when UE supported on rail and min assist for reverse stepping for safety and min-mod cues for RLE position on reverse step up.        Reciprocal forward step over cane with reciprocal UE swing x 25 bil with tactile cues for proper UE swing. Performed with AW and [employer] as listed above  BLE stepping over cane forward/reverse x 12 bil with cues for proper step length in each direction performed with AW and WW as listed above  Lateral stepping BLE over cane with 4# AW x 15 bil  Multiple mild posterior or lateral LOB with stepping task over obstacle, using stepping strategy to correct LOB initially, but improved to perform use of ankle strategy with instruction from    Cues for improved intentional movement and awareness of LLE and LUE with dynamic movement training which  reduced dyskinesia on this day. Was noted to have wide BOS with weighted gait that improve slightly with instruction as well as difficulty with symmetry of step length over can initially, but also improved with repetitions     PATIENT  EDUCATION: Education details:Pt educated throughout session about proper posture and technique with exercises. Improved exercise technique, movement at target joints, use of target muscles after min to mod verbal, visual, tactile cues.  S/s expected with PCA CVA.   Person educated: Patient Education method: Explanation Education comprehension: verbalized understanding and needs further education  HOME EXERCISE PROGRAM:  Access Code: [email or web address] URL: [email or web address] Date: 08/06/2023 Prepared by: Lonni Gainer  Exercises - Standing March with Unilateral Counter Support  - 1 x daily - 7 x weekly - 2 sets - 10 reps - Sit to Stand Without Arm Support  - 1 x daily - 7 x weekly - 2 sets - 10 reps - Side Stepping with Counter Support  - 1 x daily - 7 x weekly - 3 sets - 10 reps   GOALS: Goals reviewed with patient? Yes  SHORT TERM GOALS: Target date: 09/10/2023  Patient will be independent in home exercise program to improve strength/mobility for better functional independence with ADLs and functional mobility.  Baseline: provided on 5/29  Goal status: In progress   LONG TERM GOALS: Target date: 10/22/2023  Patient (> 6 years old) will complete five times sit to stand test (5XSTS) in < 12 seconds indicating an increased LE strength and improved balance.  Baseline: 15.12 seconds with arms across chest 6/24: 11.9 sec no UE support  Goal status: INITIAL  2.  Patient will increase Berg Balance score to > 51/56 to demonstrate improved balance and decreased fall risk during functional activities and ADLs.  Baseline: 38/56 6/24: 44/56 Goal status: INITIAL  3.  Patient will increase six minute walk test ( ) distance to >109ft for progression to community ambulator and improve gait ability  Baseline: 1,027 feet (313 meters, Avg speed 0.868m/s), no AD, with CGA and occasional min A 6/24: 1169ft no  Goal status: INITIAL  4.  Patient will increase 10 meter walk test  to >1.73m/s as to improve gait speed for better community ambulation  and to reduce fall risk. Baseline: 0.93 m/s without AD with CGA  6/24: 0.86ms  Goal status: INITIAL  5.  Patient will increase Functional Gait Assessment (FGA) score to >20/30 as to reduce fall risk and improve dynamic gait safety with community ambulation.  Baseline: 5/30 Deferred due to time Goal status: INITIAL  6.  Patient will improve Stroke Impact Scale-16 by >9 points indicating patient experiencing increased independence with functional mobility tasks and ADLs to improve quality of life and decrease caregiver burden.  Baseline: 35/80 6/24: 69/80 Goal status: INITIAL  ASSESSMENT:  CLINICAL IMPRESSION:  Patient arrived motivated to participate in therapy and reports the restlessness in his LEs is a little worse today. PT treatment focused on functional mobility with added resistance to improve proprioception with prolonged distances and obstacle navigation. PT treatment continued to also address BLE coordination with and multiplanar movement; mild difficulty with reverse step ups but improved movement patterns with increased repetitions. Noted to have improved step width and reciprocal pattern with increased repetition and instruction from PT.  Pt will continue to benefit from skilled physical therapy interventions to address impairments, improve QOL, and attain therapy goals.    OBJECTIVE IMPAIRMENTS: Abnormal gait, decreased activity tolerance, decreased balance, decreased coordination, decreased endurance, decreased knowledge of condition, decreased knowledge of use of DME, decreased mobility, difficulty walking, decreased strength, increased fascial restrictions, impaired flexibility, impaired sensation, impaired UE functional use, improper body mechanics, and pain.   ACTIVITY LIMITATIONS: carrying, lifting, bending, sitting, standing, squatting, stairs, transfers, bathing, toileting, dressing, reach over head,  hygiene/grooming, and locomotion level  PARTICIPATION LIMITATIONS: meal prep, cleaning, laundry, interpersonal relationship, driving, shopping, community activity, and yard work  PERSONAL FACTORS: Age, Fitness, Time since onset of injury/illness/exacerbation, and 3+ comorbidities: Hyperlipidemia, BPH, HTN, hx of basal cell carcinoma, hx of squamous cell carcinoma, arthritis, R and L reverse shoulder arthroplasties (R in 2024 and L in 2017) are also affecting patient's functional outcome.   REHAB POTENTIAL: Excellent  CLINICAL DECISION MAKING: Evolving/moderate complexity  EVALUATION COMPLEXITY: Moderate  PLAN:  PT FREQUENCY: 1-2x/week  PT DURATION: 12 weeks  PLANNED INTERVENTIONS: 97164- PT Re-evaluation, 97750- Physical Performance Testing, 97110-Therapeutic exercises, 97530- Therapeutic activity, 97112- Neuromuscular re-education, 97535- Self Care, [id number]- Manual therapy, [phone]- Gait training, [phone]- Orthotic Initial, [phone]- Orthotic/Prosthetic subsequent, [phone]- Canalith repositioning, [phone]- Electrical stimulation (manual), Patient/Family education, Balance training, Stair training, Taping, Dry Needling, Joint mobilization, Joint manipulation, Spinal mobilization, Vestibular training, Visual/preceptual remediation/compensation, DME instructions, Cryotherapy, Moist heat, and Biofeedback  PLAN FOR NEXT SESSION:  - participate in higher level dynamic balance and gait challenges including:  - horizontal and vertical head turns  - stepping towards external targets due to LLE incoordination  - gait in variable directions  - unstable surfaces - step-ups for B LE strengthening with simultaneous dynamic balance tasks - progress to practicing golf swing for return to recreational activities  HEP review/expansion  Massie Dollar PT, DPT  Physical Therapist - [facility]  [facility]  9:51 AM 09/22/23

## 2023-09-25 ENCOUNTER — Ambulatory Visit: Admitting: Physical Therapy

## 2023-09-25 DIAGNOSIS — R269 Unspecified abnormalities of gait and mobility: Secondary | ICD-10-CM

## 2023-09-25 DIAGNOSIS — R208 Other disturbances of skin sensation: Secondary | ICD-10-CM

## 2023-09-25 DIAGNOSIS — R262 Difficulty in walking, not elsewhere classified: Secondary | ICD-10-CM

## 2023-09-25 DIAGNOSIS — I69352 Hemiplegia and hemiparesis following cerebral infarction affecting left dominant side: Secondary | ICD-10-CM

## 2023-09-25 DIAGNOSIS — R2681 Unsteadiness on feet: Secondary | ICD-10-CM

## 2023-09-25 DIAGNOSIS — R278 Other lack of coordination: Secondary | ICD-10-CM

## 2023-09-25 DIAGNOSIS — M6281 Muscle weakness (generalized): Secondary | ICD-10-CM

## 2023-09-25 NOTE — Therapy (Signed)
 OUTPATIENT PHYSICAL THERAPY NEURO TREATMENT     Patient Name: George Bruce MRN: [id number] DOB:08-30-1945, 78 y.o., male Today's Date: 09/25/2023   PCP: Auston Reyes BIRCH, MD  REFERRING PROVIDER: Auston Reyes BIRCH, MD   END OF SESSION:   PT End of Session - 09/25/23 1022     Visit Number 16    Number of Visits 24    Date for PT Re-Evaluation 10/22/23    Progress Note Due on Visit 10    PT Start Time 1020    PT Stop Time 1100    PT Time Calculation (min) 40 min    Equipment Utilized During Treatment Gait belt    Activity Tolerance Patient tolerated treatment well    Behavior During Therapy WFL for tasks assessed/performed            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   Hypertension    Wears dentures    partial upper and lower   Past Surgical History:  Procedure Laterality Date   CATARACT EXTRACTION W/PHACO Right 09/29/2022   Procedure: CATARACT EXTRACTION PHACO AND INTRAOCULAR LENS PLACEMENT (IOC) RIGHT  10.98  00:56.5;  Surgeon: Myrna Adine Anes, MD;  Location: [facility];  Service: Ophthalmology;  Laterality: Right;   CATARACT EXTRACTION W/PHACO Left 10/13/2022   Procedure: CATARACT EXTRACTION PHACO AND INTRAOCULAR LENS PLACEMENT (IOC) LEFT 10.79 00:54.1;  Surgeon: Myrna Adine Anes, MD;  Location: [facility];  Service: Ophthalmology;  Laterality: Left;   COLONOSCOPY WITH PROPOFOL  N/A 04/21/2018   Procedure: COLONOSCOPY WITH PROPOFOL ;  Surgeon: Janalyn Keene NOVAK, MD;  Location: [facility];  Service: Endoscopy;  Laterality: N/A;   INGUINAL HERNIA REPAIR Left 1990's   INGUINAL HERNIA REPAIR Right 2015   REVERSE SHOULDER ARTHROPLASTY Left 11/06/2015   Procedure: REVERSE SHOULDER ARTHROPLASTY;  Surgeon: Norleen JINNY Maltos, MD;  Location: [facility];  Service: Orthopedics;  Laterality: Left;   REVERSE SHOULDER ARTHROPLASTY Right 03/25/2022   Procedure: REVERSE SHOULDER ARTHROPLASTY;  Surgeon: Maltos Norleen JINNY, MD;  Location: [facility];  Service: Orthopedics;  Laterality: Right;  MAKE 2ND CASE   Patient Active Problem List   Diagnosis Date Noted   Acute CVA (cerebrovascular accident) (HCC) 07/21/2023   Elevated blood pressure reading 07/21/2023   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   Acute diverticulitis 05/07/2018   History of colonic polyps    Benign neoplasm of ascending colon    Diverticulosis of large intestine without diverticulitis    Erectile dysfunction due to arterial insufficiency 02/18/2018   Degenerative disc disease, lumbar 08/26/2016   Bilateral hip pain 06/24/2016   Chronic midline low back pain 06/24/2016   Polyarthralgia 06/24/2016   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   Hematospermia 05/03/2015   Colon polyps 08/16/2013   Hematuria 08/16/2013   Hyperlipidemia 08/16/2013   Incomplete emptying of bladder 06/23/2013   Benign prostatic hyperplasia with incomplete bladder emptying 11/11/2011   Elevated PSA 11/11/2011   Reduced libido 11/11/2011    ONSET DATE: 07/20/2023 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, right PCA occlusion   REFERRING DIAG: I63.50 (ICD-10-CM) - Cerebral infarction due to unspecified occlusion or stenosis of  unspecified cerebral artery   THERAPY DIAG:  Unsteadiness on feet  Other lack of coordination  Difficulty in walking, not elsewhere classified  Muscle weakness (generalized)  Abnormality of gait  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 No new complaints. Is still hopeful to return to golf, but wants shakes to reduce before he attempts a round.   Pt states he is still taking tramadol in morning and at night, which is keeping his sciatic pain under control.  No reports of pain during session and no reports of falls.  Accompanied by self today    From Initial Eval: Pt states he has hx of sciatica in L LE. Pt states recent CVA affected his L side and he is having numbness in L UE and LE. Pt states the CVA affected him from his jaw down through his L arm and L LE. Pt states he is left hand dominant. Pt states he was walking ~14miles daily and performing all household chores prior to CVA. Patient states he was very active and completely independent prior.   Pt states he had MRI yesterday for L LE sciatica and is going to see MD tomorrow to follow-up on results (with [facility]). Patient reporting current pain as 6/10 with it being greater this morning, but he took pain medication to ease it off prior to coming to therapy.   Pt accompanied by: significant other, Wife, [name]  PERTINENT HISTORY: PMH of Hyperlipidemia, BPH, HTN, hx of basal cell carcinoma, hx of squamous cell carcinoma, arthritis, R and L reverse shoulder arthroplasties (R in 2024 and L in 2017)  PAIN:  Are you having pain? Yes: NPRS scale: 6/10 after taking pain medication this AM Pain location: L LE sciatica Pain description: severe ache Aggravating factors: he can wake up with it, no specific aggravating factors Relieving factors: pt reports having to take pain medication (hydrocodone ) this AM, hx of having injections and prednisone ; uses horse liniment   PRECAUTIONS: Fall  RED FLAGS: None   WEIGHT BEARING RESTRICTIONS: No  FALLS: Has patient fallen in last 6 months? No and  but states had a few stumbles getting his feet tangled up  LIVING ENVIRONMENT: Lives with: lives with their spouse Lives in: House/apartment Stairs: Yes:  External: 3+1 steps; on right going up; 1 level house Has following equipment at home: Walker - 2 wheeled and built-in shower seat  PLOF: Independent, Independent with household mobility without device, Independent with homemaking with ambulation, Independent with gait, and Leisure: golf  PATIENT GOALS: get back to half normal being able to do yard work, wash a car, and golf  OBJECTIVE:  Note: Objective measures were completed at Evaluation unless otherwise noted.  DIAGNOSTIC FINDINGS:  EXAM: MRI HEAD WITHOUT CONTRAST   TECHNIQUE: Multiplanar, multiecho pulse sequences of the brain and surrounding structures were obtained without intravenous contrast.   COMPARISON:  CT head, CTA, CTP yesterday.  Brain MRI 02/22/2008.  IMPRESSION: 1. Positive for Acute lacunar infarct of the lateral Right Thalamus, but also abnormal diffusion throughout the right hippocampal formation. Both are Right PCA territory, although superimposed seizure related changes of the right hippocampus would be difficult to exclude. No associated hemorrhage or mass effect.   2. 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 No other acute intracranial abnormality identified.   Electronically Signed   By: VEAR Hurst M.D.   On: 07/21/2023 05:51  CTA head:   1. 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 There is some reconstitution of enhancement within right posterior cerebral artery branches beginning at the distal P2 segment level. 2. 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 3.  Atherosclerotic plaque within the intracranial internal carotid arteries with no more than mild stenosis.     Electronically Signed   By: Rockey Childs D.O.   On: 07/20/2023 19:32   COGNITION: Overall cognitive status: Within functional limits for tasks assessed   SENSATION: Light touch: Impaired  and basically absent Proprioception: Impaired  Can only feel deep pressure in L LE  COORDINATION: Incoordinated in L LE, likely due to sensory impairments  EDEMA:  Not formally assessed  MUSCLE TONE: Not formally assessed  MUSCLE LENGTH: Not formally assessed  DTRs:  Not formally assessed  POSTURE: rounded shoulders and forward head  LOWER EXTREMITY ROM:     Active  Right Eval  WFL/WNL throughout Left Eval  WFL/WNL throughout  Hip flexion    Hip extension    Hip abduction    Hip adduction    Hip internal rotation    Hip external rotation    Knee flexion    Knee extension    Ankle dorsiflexion    Ankle plantarflexion    Ankle inversion    Ankle eversion     (Blank rows = not tested)  LOWER EXTREMITY MMT:    MMT Right Eval Left Eval  Hip flexion 4+ 3+  Hip extension    Hip abduction    Hip adduction    Hip internal rotation    Hip external rotation    Knee flexion 4+ 4-  Knee extension 4+ 4+  Ankle dorsiflexion 4+ 3  Ankle plantarflexion 4+ 4-  Ankle inversion    Ankle eversion    (Blank rows = not tested)  Manual Muscle Test Scale 0/5 = No muscle contraction can be seen or felt 1/5 = Contraction can be felt, but there is no motion 2-/5 = Part moves through incomplete ROM w/ gravity decreased 2/5 = Part moves through complete ROM  w/ gravity decreased 2+/5 = Part moves through incomplete ROM (<50%) against gravity or through complete ROM w/ gravity 3-/5 = Part moves through incomplete ROM (>50%) against gravity 3/5 = Part moves through complete ROM against gravity 3+/5 = Part moves through complete ROM against gravity/slight resistance 4-/5=  Holds test position against slight to moderate pressure 4/5 = Part moves through complete ROM against gravity/moderate resistance 4+/5= Holds test position against moderate to strong pressure 5/5 = Part moves through complete ROM against gravity/full resistance  BED MOBILITY:  Not tested, pt denies difficulty with this  TRANSFERS: Sit to stand: SBA and CGA  Assistive device utilized: Environmental consultant - 2 wheeled and None     Stand to sit: SBA and CGA  Assistive device utilized: Environmental consultant - 2 wheeled and None     Chair to chair: SBA and CGA  Assistive device utilized: Environmental consultant - 2 wheeled and None      *requires CGA when not using AD* RAMP:  Not tested  CURB:  Not tested  STAIRS: Findings: Level of Assistance: CGA and Min A, Stair Negotiation Technique: Step to Pattern Forwards with Single Rail on Right, Number of Stairs: 4, Height of Stairs: 6in   , and Comments: incoordinated, but no greater than min A for balance; only used R HR to simulate home environment GAIT: Findings: Gait Characteristics: step through pattern, decreased arm swing- Right, decreased arm swing- Left, decreased step length- Right, decreased step length- Left, decreased stride length, and wide BOS, Distance walked: 63ft + 322ft, Assistive device utilized:None, Level of assistance: CGA and Min A, and Comments: pt uses RW for all ambulation at home, but didn't use AD during session to assess patient's gait without  FUNCTIONAL TESTS:  5 times sit to stand: 15.12 seconds with arms across chest  6 minute walk test: need to assess 10 meter walk test: 0.93 m/s without AD with CGA for safety  Berg Balance Scale: need to assess Functional gait assessment: need to assess   PATIENT SURVEYS:  Stroke Impact Scale 35/80                                                                                                                              TREATMENT DATE: 09/25/2023  Pt arrives to clinic ambulating with SPC.   Unless otherwise  stated, CGA was provided and gait belt donned in order to ensure pt safety throughout session.  nustep reciprocal movement training x 6 min, random resistance. Cues for consistent SPM 45-55 min.  Difficulty with resistance >level 5 on this day.   Weighted gait with 3# AW in simulated community environment of hospital hallway, cement sidewalk at healing garden including ramp to and from CA center, and through ~ 275ft of grass. Performed 5 bouts of >578ft each prior to seated rest break.   Side stepping over 3 cane in parallel bars without UE support x 5 bil  Forward reverse reciprocal gait over 3 canes x  3 with bUE, x 3 with 1 UE support and x 3 without UE support. Improved weight shift, step length and reciprocal pattern with reverse as repetitions progressed.      PATIENT EDUCATION: Education details:Pt educated throughout session about proper posture and technique with exercises. Improved exercise technique, movement at target joints, use of target muscles after min to mod verbal, visual, tactile cues.  S/s expected with PCA CVA.   Person educated: Patient Education method: Explanation Education comprehension: verbalized understanding and needs further education  HOME EXERCISE PROGRAM:  Access Code: [email or web address] URL: [email or web address] Date: 08/06/2023 Prepared by: Lonni Gainer  Exercises - Standing March with Unilateral Counter Support  - 1 x daily - 7 x weekly - 2 sets - 10 reps - Sit to Stand Without Arm Support  - 1 x daily - 7 x weekly - 2 sets - 10 reps - Side Stepping with Counter Support  - 1 x daily - 7 x weekly - 3 sets - 10 reps   GOALS: Goals reviewed with patient? Yes  SHORT TERM GOALS: Target date: 09/10/2023  Patient will be independent in home exercise program to improve strength/mobility for better functional independence with ADLs and functional mobility.  Baseline: provided on 5/29  Goal status: In progress   LONG TERM GOALS: Target  date: 10/22/2023  Patient (> 61 years old) will complete five times sit to stand test (5XSTS) in < 12 seconds indicating an increased LE strength and improved balance.  Baseline: 15.12 seconds with arms across chest 6/24: 11.9 sec no UE support  Goal status: INITIAL  2.  Patient will increase Berg Balance score to > 51/56 to demonstrate improved balance and decreased fall risk during functional activities and ADLs.  Baseline: 38/56 6/24: 44/56 Goal status: INITIAL  3.  Patient will increase six minute walk test ( ) distance to >1032ft for progression to community ambulator and improve gait ability  Baseline: 1,027 feet (313 meters, Avg speed 0.857m/s), no AD, with CGA and occasional min A 6/24: 1163ft no  Goal status: INITIAL  4.  Patient will increase 10 meter walk test to >1.62m/s as to improve gait speed for better community ambulation and to reduce fall risk. Baseline: 0.93 m/s without AD with CGA  6/24: 0.66ms  Goal status: INITIAL  5.  Patient will increase Functional Gait Assessment (FGA) score to >20/30 as to reduce fall risk and improve dynamic gait safety with community ambulation.  Baseline: 5/30 Deferred due to time Goal status: INITIAL  6.  Patient will improve Stroke Impact Scale-16 by >9 points indicating patient experiencing increased independence with functional mobility tasks and ADLs to improve quality of life and decrease caregiver burden.  Baseline: 35/80 6/24: 69/80 Goal status: INITIAL  ASSESSMENT:  CLINICAL IMPRESSION:  Patient arrived motivated to participate in therapy. PT treatment focused on dynamic functional gait training in simulated community environment. Navigated multiple surfaces in weighted position  with only intermittent cues for proper step height to reduce foot drag. No LOB noted throughout session, but mild veer to the R intermittently. Also demonstrated improved movement patterns over obstacles with increased repetitions on this day.  Pt  will continue to benefit from skilled physical therapy interventions to address impairments, improve QOL, and attain therapy goals.    OBJECTIVE IMPAIRMENTS: Abnormal gait, decreased activity tolerance, decreased balance, decreased coordination, decreased endurance, decreased knowledge of condition, decreased knowledge of use of DME, decreased mobility, difficulty walking, decreased strength, increased fascial restrictions, impaired flexibility, impaired sensation, impaired UE functional use,  improper body mechanics, and pain.   ACTIVITY LIMITATIONS: carrying, lifting, bending, sitting, standing, squatting, stairs, transfers, bathing, toileting, dressing, reach over head, hygiene/grooming, and locomotion level  PARTICIPATION LIMITATIONS: meal prep, cleaning, laundry, interpersonal relationship, driving, shopping, community activity, and yard work  PERSONAL FACTORS: Age, Fitness, Time since onset of injury/illness/exacerbation, and 3+ comorbidities: Hyperlipidemia, BPH, HTN, hx of basal cell carcinoma, hx of squamous cell carcinoma, arthritis, R and L reverse shoulder arthroplasties (R in 2024 and L in 2017) are also affecting patient's functional outcome.   REHAB POTENTIAL: Excellent  CLINICAL DECISION MAKING: Evolving/moderate complexity  EVALUATION COMPLEXITY: Moderate  PLAN:  PT FREQUENCY: 1-2x/week  PT DURATION: 12 weeks  PLANNED INTERVENTIONS: [id number]- PT Re-evaluation, 97750- Physical Performance Testing, 97110-Therapeutic exercises, 97530- Therapeutic activity, 97112- Neuromuscular re-education, 97535- Self Care, [id number]- Manual therapy, [phone]- Gait training, [phone]- Orthotic Initial, [phone]- Orthotic/Prosthetic subsequent, [phone]- Canalith repositioning, [phone]- Electrical stimulation (manual), Patient/Family education, Balance training, Stair training, Taping, Dry Needling, Joint mobilization, Joint manipulation, Spinal mobilization, Vestibular training, Visual/preceptual  remediation/compensation, DME instructions, Cryotherapy, Moist heat, and Biofeedback  PLAN FOR NEXT SESSION:  - participate in higher level dynamic balance and gait challenges including:  - horizontal and vertical head turns  - stepping towards external targets due to LLE incoordination  - gait in variable directions  - unstable surfaces - step-ups for B LE strengthening with simultaneous dynamic balance tasks - progress to practicing golf swing for return to recreational activities  HEP review/expansion  Massie Dollar PT, DPT  Physical Therapist - [facility]  [facility]  10:23 AM 09/25/23

## 2023-09-29 ENCOUNTER — Ambulatory Visit: Admitting: Physical Therapy

## 2023-09-29 DIAGNOSIS — M6281 Muscle weakness (generalized): Secondary | ICD-10-CM

## 2023-09-29 DIAGNOSIS — R269 Unspecified abnormalities of gait and mobility: Secondary | ICD-10-CM

## 2023-09-29 DIAGNOSIS — R208 Other disturbances of skin sensation: Secondary | ICD-10-CM

## 2023-09-29 DIAGNOSIS — R278 Other lack of coordination: Secondary | ICD-10-CM

## 2023-09-29 DIAGNOSIS — R262 Difficulty in walking, not elsewhere classified: Secondary | ICD-10-CM

## 2023-09-29 DIAGNOSIS — I69352 Hemiplegia and hemiparesis following cerebral infarction affecting left dominant side: Secondary | ICD-10-CM

## 2023-09-29 DIAGNOSIS — R2681 Unsteadiness on feet: Secondary | ICD-10-CM | POA: Diagnosis not present

## 2023-09-29 NOTE — Therapy (Signed)
 OUTPATIENT PHYSICAL THERAPY NEURO TREATMENT     Patient Name: George Bruce MRN: [id number] DOB:1945-10-11, 78 y.o., male Today's Date: 09/29/2023   PCP: Auston Reyes BIRCH, MD  REFERRING PROVIDER: Auston Reyes BIRCH, MD   END OF SESSION:   PT End of Session - 09/29/23 1018     Visit Number 17    Number of Visits 24    Date for PT Re-Evaluation 10/22/23    Progress Note Due on Visit 10    PT Start Time 1020    PT Stop Time 1100    PT Time Calculation (min) 40 min    Equipment Utilized During Treatment Gait belt    Activity Tolerance Patient tolerated treatment well    Behavior During Therapy WFL for tasks assessed/performed            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   Hypertension    Wears dentures    partial upper and lower   Past Surgical History:  Procedure Laterality Date   CATARACT EXTRACTION W/PHACO Right 09/29/2022   Procedure: CATARACT EXTRACTION PHACO AND INTRAOCULAR LENS PLACEMENT (IOC) RIGHT  10.98  00:56.5;  Surgeon: Myrna Adine Anes, MD;  Location: [facility];  Service: Ophthalmology;  Laterality: Right;   CATARACT EXTRACTION W/PHACO Left 10/13/2022   Procedure: CATARACT EXTRACTION PHACO AND INTRAOCULAR LENS PLACEMENT (IOC) LEFT 10.79 00:54.1;  Surgeon: Myrna Adine Anes, MD;  Location: [facility];  Service: Ophthalmology;  Laterality: Left;   COLONOSCOPY WITH PROPOFOL  N/A 04/21/2018   Procedure: COLONOSCOPY WITH PROPOFOL ;  Surgeon: Janalyn Keene NOVAK, MD;  Location: [facility];  Service: Endoscopy;  Laterality: N/A;   INGUINAL HERNIA REPAIR Left 1990's   INGUINAL HERNIA REPAIR Right 2015   REVERSE SHOULDER ARTHROPLASTY Left 11/06/2015   Procedure: REVERSE SHOULDER ARTHROPLASTY;  Surgeon: Norleen JINNY Maltos, MD;  Location: [facility];  Service: Orthopedics;  Laterality: Left;   REVERSE SHOULDER ARTHROPLASTY Right 03/25/2022   Procedure: REVERSE SHOULDER ARTHROPLASTY;  Surgeon: Maltos Norleen JINNY, MD;  Location: [facility];  Service: Orthopedics;  Laterality: Right;  MAKE 2ND CASE   Patient Active Problem List   Diagnosis Date Noted   Acute CVA (cerebrovascular accident) (HCC) 07/21/2023   Elevated blood pressure reading 07/21/2023   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   Acute diverticulitis 05/07/2018   History of colonic polyps    Benign neoplasm of ascending colon    Diverticulosis of large intestine without diverticulitis    Erectile dysfunction due to arterial insufficiency 02/18/2018   Degenerative disc disease, lumbar 08/26/2016   Bilateral hip pain 06/24/2016   Chronic midline low back pain 06/24/2016   Polyarthralgia 06/24/2016   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   Hematospermia 05/03/2015   Colon polyps 08/16/2013   Hematuria 08/16/2013   Hyperlipidemia 08/16/2013   Incomplete emptying of bladder 06/23/2013   Benign prostatic hyperplasia with incomplete bladder emptying 11/11/2011   Elevated PSA 11/11/2011   Reduced libido 11/11/2011    ONSET DATE: 07/20/2023 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, right PCA occlusion   REFERRING DIAG: I63.50 (ICD-10-CM) - Cerebral infarction due to unspecified occlusion or stenosis of  unspecified cerebral artery   THERAPY DIAG:  Unsteadiness on feet  Abnormality of gait  Other lack of coordination  Difficulty in walking, not elsewhere classified  Muscle weakness (generalized)  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   Accompanied by self today    From Initial Eval: Pt states he has hx of sciatica in L LE. Pt states recent CVA affected his L side and he is having numbness in L UE and LE. Pt states the CVA affected him from his jaw down through his L arm and L LE. Pt states he is left hand dominant. Pt states he was walking ~20miles daily and performing all household chores prior to CVA. Patient states he was very active and completely independent prior.   Pt states he had MRI yesterday for L LE sciatica and is going to see MD tomorrow to follow-up on results (with [facility]). Patient reporting current pain as 6/10 with it being greater this morning, but he took pain medication to ease it off prior to coming to therapy.   Pt accompanied by: significant other, Wife, [name]  PERTINENT HISTORY: PMH of Hyperlipidemia, BPH, HTN, hx of basal cell carcinoma, hx of squamous cell carcinoma, arthritis, R and L reverse shoulder arthroplasties (R in 2024 and L in 2017)  PAIN:  Are you having pain? Yes: NPRS scale: 6/10 after taking pain medication this AM Pain location: L LE sciatica Pain description: severe ache Aggravating factors: he can wake up with it, no specific aggravating factors Relieving factors: pt reports having to take pain medication (hydrocodone ) this AM, hx of having injections and prednisone ; uses horse liniment   PRECAUTIONS: Fall  RED FLAGS: None   WEIGHT BEARING RESTRICTIONS: No  FALLS: Has patient fallen in last 6 months? No and but states had a few stumbles getting his feet tangled up  LIVING ENVIRONMENT: Lives with: lives with their spouse Lives in: House/apartment Stairs: Yes: External: 3+1 steps; on right going up; 1 level house Has  following equipment at home: Walker - 2 wheeled and built-in shower seat  PLOF: Independent, Independent with household mobility without device, Independent with homemaking with ambulation, Independent with  gait, and Leisure: golf  PATIENT GOALS: get back to half normal being able to do yard work, wash a car, and golf  OBJECTIVE:  Note: Objective measures were completed at Evaluation unless otherwise noted.  DIAGNOSTIC FINDINGS:  EXAM: MRI HEAD WITHOUT CONTRAST   TECHNIQUE: Multiplanar, multiecho pulse sequences of the brain and surrounding structures were obtained without intravenous contrast.   COMPARISON:  CT head, CTA, CTP yesterday.  Brain MRI 02/22/2008.  IMPRESSION: 1. Positive for Acute lacunar infarct of the lateral Right Thalamus, but also abnormal diffusion throughout the right hippocampal formation. Both are Right PCA territory, although superimposed seizure related changes of the right hippocampus would be difficult to exclude. No associated hemorrhage or mass effect.   2. 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 No other acute intracranial abnormality identified.   Electronically Signed   By: VEAR Hurst M.D.   On: 07/21/2023 05:51  CTA head:   1. 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 There is some reconstitution of enhancement within right posterior cerebral artery branches beginning at the distal P2 segment level. 2. 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 3. Atherosclerotic plaque within the intracranial internal carotid arteries with no more than mild stenosis.     Electronically Signed   By: Rockey Childs D.O.   On: 07/20/2023 19:32   COGNITION: Overall cognitive  status: Within functional limits for tasks assessed   SENSATION: Light touch: Impaired  and basically absent Proprioception: Impaired  Can only feel deep pressure in L LE  COORDINATION: Incoordinated in L LE, likely due to sensory impairments  EDEMA:  Not formally assessed  MUSCLE TONE: Not formally assessed  MUSCLE LENGTH: Not formally assessed  DTRs:  Not formally assessed  POSTURE: rounded shoulders and forward head  LOWER EXTREMITY ROM:     Active  Right Eval  WFL/WNL throughout Left Eval  WFL/WNL throughout  Hip flexion    Hip extension    Hip abduction    Hip adduction    Hip internal rotation    Hip external rotation    Knee flexion    Knee extension    Ankle dorsiflexion    Ankle plantarflexion    Ankle inversion    Ankle eversion     (Blank rows = not tested)  LOWER EXTREMITY MMT:    MMT Right Eval Left Eval  Hip flexion 4+ 3+  Hip extension    Hip abduction    Hip adduction    Hip internal rotation    Hip external rotation    Knee flexion 4+ 4-  Knee extension 4+ 4+  Ankle dorsiflexion 4+ 3  Ankle plantarflexion 4+ 4-  Ankle inversion    Ankle eversion    (Blank rows = not tested)  Manual Muscle Test Scale 0/5 = No muscle contraction can be seen or felt 1/5 = Contraction can be felt, but there is no motion 2-/5 = Part moves through incomplete ROM w/ gravity decreased 2/5 = Part moves through complete ROM w/ gravity decreased 2+/5 = Part moves through incomplete ROM (<50%) against gravity or through complete ROM w/ gravity 3-/5 = Part moves through incomplete ROM (>50%) against gravity 3/5 = Part moves through complete ROM against gravity 3+/5 =  Part moves through complete ROM against gravity/slight resistance 4-/5= Holds test position against slight to moderate pressure 4/5 = Part moves through complete ROM against gravity/moderate resistance 4+/5= Holds test position against moderate to strong pressure 5/5 = Part moves through  complete ROM against gravity/full resistance  BED MOBILITY:  Not tested, pt denies difficulty with this  TRANSFERS: Sit to stand: SBA and CGA  Assistive device utilized: Environmental consultant - 2 wheeled and None     Stand to sit: SBA and CGA  Assistive device utilized: Environmental consultant - 2 wheeled and None     Chair to chair: SBA and CGA  Assistive device utilized: Environmental consultant - 2 wheeled and None      *requires CGA when not using AD* RAMP:  Not tested  CURB:  Not tested  STAIRS: Findings: Level of Assistance: CGA and Min A, Stair Negotiation Technique: Step to Pattern Forwards with Single Rail on Right, Number of Stairs: 4, Height of Stairs: 6in   , and Comments: incoordinated, but no greater than min A for balance; only used R HR to simulate home environment GAIT: Findings: Gait Characteristics: step through pattern, decreased arm swing- Right, decreased arm swing- Left, decreased step length- Right, decreased step length- Left, decreased stride length, and wide BOS, Distance walked: 69ft + 31ft, Assistive device utilized:None, Level of assistance: CGA and Min A, and Comments: pt uses RW for all ambulation at home, but didn't use AD during session to assess patient's gait without  FUNCTIONAL TESTS:  5 times sit to stand: 15.12 seconds with arms across chest  6 minute walk test: need to assess 10 meter walk test: 0.93 m/s without AD with CGA for safety  Berg Balance Scale: need to assess Functional gait assessment: need to assess   PATIENT SURVEYS:  Stroke Impact Scale 35/80                                                                                                                              TREATMENT DATE: 09/29/2023  Pt arrives to clinic ambulating with SPC.   Unless otherwise stated, CGA was provided and gait belt donned in order to ensure pt safety throughout session.  Gait without resistance x 622ft with cues for step length and safety in turns.   Forward/reverse gait no UE support 6 x 10  ft.  Side stepping R and L 87ft x 6 bil  Cues for improved step length and reduced shuffling gait pattern.   Matrix resisted gait  forward 7.5# x 5,  Side stepping 7.5# x 4 bil  Reverse 7.5# x 3 Cues for improved eccentric control in all planes, greatest difficulty with eccentric control to the L.   Pt tolerated interventions well and PT remained attentive to pt needs and provided therapeutic rest break as needed due to BLE fatigue.      PATIENT EDUCATION: Education details:Pt educated throughout session about proper posture and technique with exercises. Improved exercise technique, movement at  target joints, use of target muscles after min to mod verbal, visual, tactile cues.  S/s expected with PCA CVA.   Person educated: Patient Education method: Explanation Education comprehension: verbalized understanding and needs further education  HOME EXERCISE PROGRAM:  Access Code: [email or web address] URL: [email or web address] Date: 08/06/2023 Prepared by: Lonni Gainer  Exercises - Standing March with Unilateral Counter Support  - 1 x daily - 7 x weekly - 2 sets - 10 reps - Sit to Stand Without Arm Support  - 1 x daily - 7 x weekly - 2 sets - 10 reps - Side Stepping with Counter Support  - 1 x daily - 7 x weekly - 3 sets - 10 reps   GOALS: Goals reviewed with patient? Yes  SHORT TERM GOALS: Target date: 09/10/2023  Patient will be independent in home exercise program to improve strength/mobility for better functional independence with ADLs and functional mobility.  Baseline: provided on 5/29  Goal status: In progress   LONG TERM GOALS: Target date: 10/22/2023  Patient (> 70 years old) will complete five times sit to stand test (5XSTS) in < 12 seconds indicating an increased LE strength and improved balance.  Baseline: 15.12 seconds with arms across chest 6/24: 11.9 sec no UE support  Goal status: INITIAL  2.  Patient will increase Berg Balance score to > 51/56 to  demonstrate improved balance and decreased fall risk during functional activities and ADLs.  Baseline: 38/56 6/24: 44/56 Goal status: INITIAL  3.  Patient will increase six minute walk test ( ) distance to >1096ft for progression to community ambulator and improve gait ability  Baseline: 1,027 feet (313 meters, Avg speed 0.871m/s), no AD, with CGA and occasional min A 6/24: 1176ft no  Goal status: INITIAL  4.  Patient will increase 10 meter walk test to >1.72m/s as to improve gait speed for better community ambulation and to reduce fall risk. Baseline: 0.93 m/s without AD with CGA  6/24: 0.52ms  Goal status: INITIAL  5.  Patient will increase Functional Gait Assessment (FGA) score to >20/30 as to reduce fall risk and improve dynamic gait safety with community ambulation.  Baseline: 5/30 Deferred due to time Goal status: INITIAL  6.  Patient will improve Stroke Impact Scale-16 by >9 points indicating patient experiencing increased independence with functional mobility tasks and ADLs to improve quality of life and decrease caregiver burden.  Baseline: 35/80 6/24: 69/80 Goal status: INITIAL  ASSESSMENT:  CLINICAL IMPRESSION:  Patient arrived motivated to participate in therapy. PT treatment focused on dynamic gait in all planes of movement with resistance to improved motor recruitments as well as improved step length laterally and in reverse to reduce shuffle gait pattern.  Pt will continue to benefit from skilled physical therapy interventions to address impairments, improve QOL, and attain therapy goals.    OBJECTIVE IMPAIRMENTS: Abnormal gait, decreased activity tolerance, decreased balance, decreased coordination, decreased endurance, decreased knowledge of condition, decreased knowledge of use of DME, decreased mobility, difficulty walking, decreased strength, increased fascial restrictions, impaired flexibility, impaired sensation, impaired UE functional use, improper body  mechanics, and pain.   ACTIVITY LIMITATIONS: carrying, lifting, bending, sitting, standing, squatting, stairs, transfers, bathing, toileting, dressing, reach over head, hygiene/grooming, and locomotion level  PARTICIPATION LIMITATIONS: meal prep, cleaning, laundry, interpersonal relationship, driving, shopping, community activity, and yard work  PERSONAL FACTORS: Age, Fitness, Time since onset of injury/illness/exacerbation, and 3+ comorbidities: Hyperlipidemia, BPH, HTN, hx of basal cell carcinoma, hx of squamous cell carcinoma, arthritis, R and L reverse shoulder arthroplasties (  R in 2024 and L in 2017) are also affecting patient's functional outcome.   REHAB POTENTIAL: Excellent  CLINICAL DECISION MAKING: Evolving/moderate complexity  EVALUATION COMPLEXITY: Moderate  PLAN:  PT FREQUENCY: 1-2x/week  PT DURATION: 12 weeks  PLANNED INTERVENTIONS: [id number]- PT Re-evaluation, 97750- Physical Performance Testing, 97110-Therapeutic exercises, 97530- Therapeutic activity, 97112- Neuromuscular re-education, 97535- Self Care, [id number]- Manual therapy, [phone]- Gait training, [phone]- Orthotic Initial, [phone]- Orthotic/Prosthetic subsequent, [phone]- Canalith repositioning, [phone]- Electrical stimulation (manual), Patient/Family education, Balance training, Stair training, Taping, Dry Needling, Joint mobilization, Joint manipulation, Spinal mobilization, Vestibular training, Visual/preceptual remediation/compensation, DME instructions, Cryotherapy, Moist heat, and Biofeedback  PLAN FOR NEXT SESSION:  - participate in higher level dynamic balance and gait challenges including:  - horizontal and vertical head turns  - stepping towards external targets due to LLE incoordination  - gait in variable directions  - unstable surfaces - step-ups for B LE strengthening with simultaneous dynamic balance tasks - progress to practicing golf swing for return to recreational activities  Massie Dollar PT, DPT  Physical  Therapist - [facility]  10:19 AM 09/29/23

## 2023-10-02 ENCOUNTER — Ambulatory Visit: Admitting: Physical Therapy

## 2023-10-02 DIAGNOSIS — R262 Difficulty in walking, not elsewhere classified: Secondary | ICD-10-CM

## 2023-10-02 DIAGNOSIS — R269 Unspecified abnormalities of gait and mobility: Secondary | ICD-10-CM

## 2023-10-02 DIAGNOSIS — R2681 Unsteadiness on feet: Secondary | ICD-10-CM | POA: Diagnosis not present

## 2023-10-02 DIAGNOSIS — R278 Other lack of coordination: Secondary | ICD-10-CM

## 2023-10-02 DIAGNOSIS — M6281 Muscle weakness (generalized): Secondary | ICD-10-CM

## 2023-10-02 NOTE — Therapy (Signed)
 OUTPATIENT PHYSICAL THERAPY NEURO TREATMENT     Patient Name: George Bruce MRN: [id number] DOB:12-Jul-1945, 78 y.o., male Today's Date: 10/02/2023   PCP: Auston Reyes BIRCH, MD  REFERRING PROVIDER: Auston Reyes BIRCH, MD   END OF SESSION:   PT End of Session - 10/02/23 1023     Visit Number 18    Number of Visits 24    Date for PT Re-Evaluation 10/22/23    Progress Note Due on Visit 10    PT Start Time 1020    PT Stop Time 1100    PT Time Calculation (min) 40 min    Equipment Utilized During Treatment Gait belt    Activity Tolerance Patient tolerated treatment well    Behavior During Therapy WFL for tasks assessed/performed            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   Hypertension    Wears dentures    partial upper and lower   Past Surgical History:  Procedure Laterality Date   CATARACT EXTRACTION W/PHACO Right 09/29/2022   Procedure: CATARACT EXTRACTION PHACO AND INTRAOCULAR LENS PLACEMENT (IOC) RIGHT  10.98  00:56.5;  Surgeon: Myrna Adine Anes, MD;  Location: [facility];  Service: Ophthalmology;  Laterality: Right;   CATARACT EXTRACTION W/PHACO Left 10/13/2022   Procedure: CATARACT EXTRACTION PHACO AND INTRAOCULAR LENS PLACEMENT (IOC) LEFT 10.79 00:54.1;  Surgeon: Myrna Adine Anes, MD;  Location: [facility];  Service: Ophthalmology;  Laterality: Left;   COLONOSCOPY WITH PROPOFOL  N/A 04/21/2018   Procedure: COLONOSCOPY WITH PROPOFOL ;  Surgeon: Janalyn Keene NOVAK, MD;  Location: [facility];  Service: Endoscopy;  Laterality: N/A;   INGUINAL HERNIA REPAIR Left 1990's   INGUINAL HERNIA REPAIR Right 2015   REVERSE SHOULDER ARTHROPLASTY Left 11/06/2015   Procedure: REVERSE SHOULDER ARTHROPLASTY;  Surgeon: Norleen JINNY Maltos, MD;  Location: [facility];  Service: Orthopedics;  Laterality: Left;   REVERSE SHOULDER ARTHROPLASTY Right 03/25/2022   Procedure: REVERSE SHOULDER ARTHROPLASTY;  Surgeon: Maltos Norleen JINNY, MD;  Location: [facility];  Service: Orthopedics;  Laterality: Right;  MAKE 2ND CASE   Patient Active Problem List   Diagnosis Date Noted   Acute CVA (cerebrovascular accident) (HCC) 07/21/2023   Elevated blood pressure reading 07/21/2023   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   Acute diverticulitis 05/07/2018   History of colonic polyps    Benign neoplasm of ascending colon    Diverticulosis of large intestine without diverticulitis    Erectile dysfunction due to arterial insufficiency 02/18/2018   Degenerative disc disease, lumbar 08/26/2016   Bilateral hip pain 06/24/2016   Chronic midline low back pain 06/24/2016   Polyarthralgia 06/24/2016   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   Hematospermia 05/03/2015   Colon polyps 08/16/2013   Hematuria 08/16/2013   Hyperlipidemia 08/16/2013   Incomplete emptying of bladder 06/23/2013   Benign prostatic hyperplasia with incomplete bladder emptying 11/11/2011   Elevated PSA 11/11/2011   Reduced libido 11/11/2011    ONSET DATE: 07/20/2023 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, right PCA occlusion   REFERRING DIAG: I63.50 (ICD-10-CM) - Cerebral infarction due to unspecified occlusion or stenosis of  unspecified cerebral artery   THERAPY DIAG:  Unsteadiness on feet  Abnormality of gait  Other lack of coordination  Difficulty in walking, not elsewhere classified  Muscle weakness (generalized)  Rationale for Evaluation and Treatment: Rehabilitation  SUBJECTIVE:                                                                                                                                                                                             SUBJECTIVE  STATEMENT:  Pt reports that he is doing well. Will be hosting a golf tournament in the next week. States less shaking the last couple of days. No other updates   Accompanied by self today    From Initial Eval: Pt states he has hx of sciatica in L LE. Pt states recent CVA affected his L side and he is having numbness in L UE and LE. Pt states the CVA affected him from his jaw down through his L arm and L LE. Pt states he is left hand dominant. Pt states he was walking ~105miles daily and performing all household chores prior to CVA. Patient states he was very active and completely independent prior.   Pt states he had MRI yesterday for L LE sciatica and is going to see MD tomorrow to follow-up on results (with [facility]). Patient reporting current pain as 6/10 with it being greater this morning, but he took pain medication to ease it off prior to coming to therapy.   Pt accompanied by: significant other, Wife, [name]  PERTINENT HISTORY: PMH of Hyperlipidemia, BPH, HTN, hx of basal cell carcinoma, hx of squamous cell carcinoma, arthritis, R and L reverse shoulder arthroplasties (R in 2024 and L in 2017)  PAIN:  Are you having pain? Yes: NPRS scale: 6/10 after taking pain medication this AM Pain location: L LE sciatica Pain description: severe ache Aggravating factors: he can wake up with it, no specific aggravating factors Relieving factors: pt reports having to take pain medication (hydrocodone ) this AM, hx of having injections and prednisone ; uses horse liniment   PRECAUTIONS: Fall  RED FLAGS: None   WEIGHT BEARING RESTRICTIONS: No  FALLS: Has patient fallen in last 6 months? No and but states had a few stumbles getting his feet tangled up  LIVING ENVIRONMENT: Lives with: lives with their spouse Lives in: House/apartment Stairs: Yes: External: 3+1 steps; on right going up; 1 level house Has following equipment at home: Walker - 2 wheeled and built-in shower seat  PLOF:  Independent, Independent with household mobility without device, Independent with homemaking with ambulation, Independent with gait, and Leisure: golf  PATIENT GOALS: get back to  half normal being able to do yard work, wash a car, and golf  OBJECTIVE:  Note: Objective measures were completed at Evaluation unless otherwise noted.  DIAGNOSTIC FINDINGS:  EXAM: MRI HEAD WITHOUT CONTRAST   TECHNIQUE: Multiplanar, multiecho pulse sequences of the brain and surrounding structures were obtained without intravenous contrast.   COMPARISON:  CT head, CTA, CTP yesterday.  Brain MRI 02/22/2008.  IMPRESSION: 1. Positive for Acute lacunar infarct of the lateral Right Thalamus, but also abnormal diffusion throughout the right hippocampal formation. Both are Right PCA territory, although superimposed seizure related changes of the right hippocampus would be difficult to exclude. No associated hemorrhage or mass effect.   2. 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 No other acute intracranial abnormality identified.   Electronically Signed   By: VEAR Hurst M.D.   On: 07/21/2023 05:51  CTA head:   1. 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 There is some reconstitution of enhancement within right posterior cerebral artery branches beginning at the distal P2 segment level. 2. 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 3. Atherosclerotic plaque within the intracranial internal carotid arteries with no more than mild stenosis.     Electronically Signed   By: Rockey Childs D.O.   On: 07/20/2023 19:32   COGNITION: Overall cognitive status: Within functional limits for tasks assessed   SENSATION: Light touch:  Impaired  and basically absent Proprioception: Impaired  Can only feel deep pressure in L LE  COORDINATION: Incoordinated in L LE, likely due to sensory impairments  EDEMA:  Not formally assessed  MUSCLE TONE: Not formally assessed  MUSCLE LENGTH: Not formally assessed  DTRs:  Not formally assessed  POSTURE: rounded shoulders and forward head  LOWER EXTREMITY ROM:     Active  Right Eval  WFL/WNL throughout Left Eval  WFL/WNL throughout  Hip flexion    Hip extension    Hip abduction    Hip adduction    Hip internal rotation    Hip external rotation    Knee flexion    Knee extension    Ankle dorsiflexion    Ankle plantarflexion    Ankle inversion    Ankle eversion     (Blank rows = not tested)  LOWER EXTREMITY MMT:    MMT Right Eval Left Eval  Hip flexion 4+ 3+  Hip extension    Hip abduction    Hip adduction    Hip internal rotation    Hip external rotation    Knee flexion 4+ 4-  Knee extension 4+ 4+  Ankle dorsiflexion 4+ 3  Ankle plantarflexion 4+ 4-  Ankle inversion    Ankle eversion    (Blank rows = not tested)  Manual Muscle Test Scale 0/5 = No muscle contraction can be seen or felt 1/5 = Contraction can be felt, but there is no motion 2-/5 = Part moves through incomplete ROM w/ gravity decreased 2/5 = Part moves through complete ROM w/ gravity decreased 2+/5 = Part moves through incomplete ROM (<50%) against gravity or through complete ROM w/ gravity 3-/5 = Part moves through incomplete ROM (>50%) against gravity 3/5 = Part moves through complete ROM against gravity 3+/5 = Part moves through complete ROM against gravity/slight resistance 4-/5=  Holds test position against slight to moderate pressure 4/5 = Part moves through complete ROM against gravity/moderate resistance 4+/5= Holds test position against moderate to strong pressure 5/5 = Part moves through complete ROM against gravity/full resistance  BED MOBILITY:  Not tested, pt  denies difficulty with this  TRANSFERS: Sit to stand: SBA and CGA  Assistive device utilized: Environmental consultant - 2 wheeled and None     Stand to sit: SBA and CGA  Assistive device utilized: Environmental consultant - 2 wheeled and None     Chair to chair: SBA and CGA  Assistive device utilized: Environmental consultant - 2 wheeled and None      *requires CGA when not using AD* RAMP:  Not tested  CURB:  Not tested  STAIRS: Findings: Level of Assistance: CGA and Min A, Stair Negotiation Technique: Step to Pattern Forwards with Single Rail on Right, Number of Stairs: 4, Height of Stairs: 6in   , and Comments: incoordinated, but no greater than min A for balance; only used R HR to simulate home environment GAIT: Findings: Gait Characteristics: step through pattern, decreased arm swing- Right, decreased arm swing- Left, decreased step length- Right, decreased step length- Left, decreased stride length, and wide BOS, Distance walked: 83ft + 34ft, Assistive device utilized:None, Level of assistance: CGA and Min A, and Comments: pt uses RW for all ambulation at home, but didn't use AD during session to assess patient's gait without  FUNCTIONAL TESTS:  5 times sit to stand: 15.12 seconds with arms across chest  6 minute walk test: need to assess 10 meter walk test: 0.93 m/s without AD with CGA for safety  Berg Balance Scale: need to assess Functional gait assessment: need to assess   PATIENT SURVEYS:  Stroke Impact Scale 35/80                                                                                                                              TREATMENT DATE: 10/02/2023  Pt arrives to clinic ambulating with SPC.   Unless otherwise stated, CGA was provided and gait belt donned in order to ensure pt safety throughout session. Box step through Y on floor CW/CWW x 10 each Standing on airex beam: 2 x 30 sec  Side stepping on airex beam x 5 bil  Tandem on airex beam; 20sec each step for 4 steps across beam x 2 bouts.  Standing on  bolster AP rocks 2 x 15 each  Static stance on Bolster 10 sec then 20 sec holding ball. Intermittent use of ball to prevent anterior LOB, and min assist to prevent posterior LOB. Performed x 2 bouts.  Standing lunge over airex beam x 10 bil with cues for increased step width.   Pt tolerated interventions well and PT remained attentive to pt needs and provided therapeutic rest break as needed due to BLE fatigue.      PATIENT EDUCATION: Education details:Pt educated throughout session about proper posture and technique with exercises.  Improved exercise technique, movement at target joints, use of target muscles after min to mod verbal, visual, tactile cues.  S/s expected with PCA CVA.   Person educated: Patient Education method: Explanation Education comprehension: verbalized understanding and needs further education  HOME EXERCISE PROGRAM:  Access Code: [email or web address] URL: [email or web address] Date: 08/06/2023 Prepared by: Lonni Gainer  Exercises - Standing March with Unilateral Counter Support  - 1 x daily - 7 x weekly - 2 sets - 10 reps - Sit to Stand Without Arm Support  - 1 x daily - 7 x weekly - 2 sets - 10 reps - Side Stepping with Counter Support  - 1 x daily - 7 x weekly - 3 sets - 10 reps   GOALS: Goals reviewed with patient? Yes  SHORT TERM GOALS: Target date: 09/10/2023  Patient will be independent in home exercise program to improve strength/mobility for better functional independence with ADLs and functional mobility.  Baseline: provided on 5/29  Goal status: In progress   LONG TERM GOALS: Target date: 10/22/2023  Patient (> 27 years old) will complete five times sit to stand test (5XSTS) in < 12 seconds indicating an increased LE strength and improved balance.  Baseline: 15.12 seconds with arms across chest 6/24: 11.9 sec no UE support  Goal status: INITIAL  2.  Patient will increase Berg Balance score to > 51/56 to demonstrate improved balance  and decreased fall risk during functional activities and ADLs.  Baseline: 38/56 6/24: 44/56 Goal status: INITIAL  3.  Patient will increase six minute walk test ( ) distance to >1028ft for progression to community ambulator and improve gait ability  Baseline: 1,027 feet (313 meters, Avg speed 0.869m/s), no AD, with CGA and occasional min A 6/24: 1114ft no  Goal status: INITIAL  4.  Patient will increase 10 meter walk test to >1.4m/s as to improve gait speed for better community ambulation and to reduce fall risk. Baseline: 0.93 m/s without AD with CGA  6/24: 0.56ms  Goal status: INITIAL  5.  Patient will increase Functional Gait Assessment (FGA) score to >20/30 as to reduce fall risk and improve dynamic gait safety with community ambulation.  Baseline: 5/30 Deferred due to time Goal status: INITIAL  6.  Patient will improve Stroke Impact Scale-16 by >9 points indicating patient experiencing increased independence with functional mobility tasks and ADLs to improve quality of life and decrease caregiver burden.  Baseline: 35/80 6/24: 69/80 Goal status: INITIAL  ASSESSMENT:  CLINICAL IMPRESSION:  Patient arrived motivated to participate in therapy. PT treatment focused on dynamic balance training on unlevel surface for improved use of ankle strategy for prevent LOB in all planes. Was noted to have reduced ataxic movement on this day. Difficulty AP control on narrow BOS, but improved ankle strategy with increased repetitions..  Pt will continue to benefit from skilled physical therapy interventions to address impairments, improve QOL, and attain therapy goals.    OBJECTIVE IMPAIRMENTS: Abnormal gait, decreased activity tolerance, decreased balance, decreased coordination, decreased endurance, decreased knowledge of condition, decreased knowledge of use of DME, decreased mobility, difficulty walking, decreased strength, increased fascial restrictions, impaired flexibility, impaired  sensation, impaired UE functional use, improper body mechanics, and pain.   ACTIVITY LIMITATIONS: carrying, lifting, bending, sitting, standing, squatting, stairs, transfers, bathing, toileting, dressing, reach over head, hygiene/grooming, and locomotion level  PARTICIPATION LIMITATIONS: meal prep, cleaning, laundry, interpersonal relationship, driving, shopping, community activity, and yard work  PERSONAL FACTORS: Age, Fitness, Time since onset of injury/illness/exacerbation, and 3+ comorbidities: Hyperlipidemia, BPH,  HTN, hx of basal cell carcinoma, hx of squamous cell carcinoma, arthritis, R and L reverse shoulder arthroplasties (R in 2024 and L in 2017) are also affecting patient's functional outcome.   REHAB POTENTIAL: Excellent  CLINICAL DECISION MAKING: Evolving/moderate complexity  EVALUATION COMPLEXITY: Moderate  PLAN:  PT FREQUENCY: 1-2x/week  PT DURATION: 12 weeks  PLANNED INTERVENTIONS: 97164- PT Re-evaluation, 97750- Physical Performance Testing, 97110-Therapeutic exercises, 97530- Therapeutic activity, 97112- Neuromuscular re-education, 97535- Self Care, [id number]- Manual therapy, [phone]- Gait training, [phone]- Orthotic Initial, [phone]- Orthotic/Prosthetic subsequent, [phone]- Canalith repositioning, [phone]- Electrical stimulation (manual), Patient/Family education, Balance training, Stair training, Taping, Dry Needling, Joint mobilization, Joint manipulation, Spinal mobilization, Vestibular training, Visual/preceptual remediation/compensation, DME instructions, Cryotherapy, Moist heat, and Biofeedback  PLAN FOR NEXT SESSION:  - participate in higher level dynamic balance and gait challenges including:  - horizontal and vertical head turns  - stepping towards external targets due to LLE incoordination  - gait in variable directions  - unstable surfaces - step-ups for B LE strengthening with simultaneous dynamic balance tasks - progress to practicing golf swing for return to  recreational activities  Massie Dollar PT, DPT  Physical Therapist - [facility]  [facility]  10:24 AM 10/02/23

## 2023-10-06 ENCOUNTER — Ambulatory Visit: Admitting: Physical Therapy

## 2023-10-06 DIAGNOSIS — R262 Difficulty in walking, not elsewhere classified: Secondary | ICD-10-CM

## 2023-10-06 DIAGNOSIS — R278 Other lack of coordination: Secondary | ICD-10-CM

## 2023-10-06 DIAGNOSIS — R208 Other disturbances of skin sensation: Secondary | ICD-10-CM

## 2023-10-06 DIAGNOSIS — R2681 Unsteadiness on feet: Secondary | ICD-10-CM

## 2023-10-06 DIAGNOSIS — R269 Unspecified abnormalities of gait and mobility: Secondary | ICD-10-CM

## 2023-10-06 DIAGNOSIS — M6281 Muscle weakness (generalized): Secondary | ICD-10-CM

## 2023-10-06 DIAGNOSIS — M5432 Sciatica, left side: Secondary | ICD-10-CM

## 2023-10-06 NOTE — Therapy (Signed)
 OUTPATIENT PHYSICAL THERAPY NEURO TREATMENT     Patient Name: George Bruce MRN: [id number] DOB:02-14-1946, 78 y.o., male Today's Date: 10/06/2023   PCP: Auston Reyes BIRCH, MD  REFERRING PROVIDER: Auston Reyes BIRCH, MD   END OF SESSION:   PT End of Session - 10/06/23 1017     Visit Number 19    Number of Visits 24    Date for PT Re-Evaluation 10/22/23    Progress Note Due on Visit 10    PT Start Time 1019    PT Stop Time 1100    PT Time Calculation (min) 41 min    Equipment Utilized During Treatment Gait belt    Activity Tolerance Patient tolerated treatment well    Behavior During Therapy WFL for tasks assessed/performed            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   Hypertension    Wears dentures    partial upper and lower   Past Surgical History:  Procedure Laterality Date   CATARACT EXTRACTION W/PHACO Right 09/29/2022   Procedure: CATARACT EXTRACTION PHACO AND INTRAOCULAR LENS PLACEMENT (IOC) RIGHT  10.98  00:56.5;  Surgeon: Myrna Adine Anes, MD;  Location: [facility];  Service: Ophthalmology;  Laterality: Right;   CATARACT EXTRACTION W/PHACO Left 10/13/2022   Procedure: CATARACT EXTRACTION PHACO AND INTRAOCULAR LENS PLACEMENT (IOC) LEFT 10.79 00:54.1;  Surgeon: Myrna Adine Anes, MD;  Location: [facility];  Service: Ophthalmology;  Laterality: Left;   COLONOSCOPY WITH PROPOFOL  N/A 04/21/2018   Procedure: COLONOSCOPY WITH PROPOFOL ;  Surgeon: Janalyn Keene NOVAK, MD;  Location: [facility];  Service: Endoscopy;  Laterality: N/A;   INGUINAL HERNIA REPAIR Left 1990's   INGUINAL HERNIA REPAIR Right 2015   REVERSE SHOULDER ARTHROPLASTY Left 11/06/2015   Procedure: REVERSE SHOULDER ARTHROPLASTY;  Surgeon: Norleen JINNY Maltos, MD;  Location: [facility];  Service: Orthopedics;  Laterality: Left;   REVERSE SHOULDER ARTHROPLASTY Right 03/25/2022   Procedure: REVERSE SHOULDER ARTHROPLASTY;  Surgeon: Maltos Norleen JINNY, MD;  Location: [facility];  Service: Orthopedics;  Laterality: Right;  MAKE 2ND CASE   Patient Active Problem List   Diagnosis Date Noted   Acute CVA (cerebrovascular accident) (HCC) 07/21/2023   Elevated blood pressure reading 07/21/2023   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   Acute diverticulitis 05/07/2018   History of colonic polyps    Benign neoplasm of ascending colon    Diverticulosis of large intestine without diverticulitis    Erectile dysfunction due to arterial insufficiency 02/18/2018   Degenerative disc disease, lumbar 08/26/2016   Bilateral hip pain 06/24/2016   Chronic midline low back pain 06/24/2016   Polyarthralgia 06/24/2016   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   Hematospermia 05/03/2015   Colon polyps 08/16/2013   Hematuria 08/16/2013   Hyperlipidemia 08/16/2013   Incomplete emptying of bladder 06/23/2013   Benign prostatic hyperplasia with incomplete bladder emptying 11/11/2011   Elevated PSA 11/11/2011   Reduced libido 11/11/2011    ONSET DATE: 07/20/2023 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, right PCA occlusion   REFERRING DIAG: I63.50 (ICD-10-CM) - Cerebral infarction due to unspecified occlusion or stenosis of  unspecified cerebral artery   THERAPY DIAG:  Unsteadiness on feet  Abnormality of gait  Difficulty in walking, not elsewhere classified  Sciatica, left side  Muscle weakness (generalized)  Other disturbances of skin sensation  Other lack of coordination  Rationale for Evaluation and Treatment: Rehabilitation  SUBJECTIVE:                                                                                                                                                                                              SUBJECTIVE STATEMENT:  Pt reports that he is doing well. Will be hosting a golf tournament in the next week. States less shaking the last couple of days. No other updates   Accompanied by self today    From Initial Eval: Pt states he has hx of sciatica in L LE. Pt states recent CVA affected his L side and he is having numbness in L UE and LE. Pt states the CVA affected him from his jaw down through his L arm and L LE. Pt states he is left hand dominant. Pt states he was walking ~44miles daily and performing all household chores prior to CVA. Patient states he was very active and completely independent prior.   Pt states he had MRI yesterday for L LE sciatica and is going to see MD tomorrow to follow-up on results (with [facility]). Patient reporting current pain as 6/10 with it being greater this morning, but he took pain medication to ease it off prior to coming to therapy.   Pt accompanied by: significant other, Wife, [name]  PERTINENT HISTORY: PMH of Hyperlipidemia, BPH, HTN, hx of basal cell carcinoma, hx of squamous cell carcinoma, arthritis, R and L reverse shoulder arthroplasties (R in 2024 and L in 2017)  PAIN:  Are you having pain? Yes: NPRS scale: 6/10 after taking pain medication this AM Pain location: L LE sciatica Pain description: severe ache Aggravating factors: he can wake up with it, no specific aggravating factors Relieving factors: pt reports having to take pain medication (hydrocodone ) this AM, hx of having injections and prednisone ; uses horse liniment   PRECAUTIONS: Fall  RED FLAGS: None   WEIGHT BEARING RESTRICTIONS: No  FALLS: Has patient fallen in last 6 months? No and but states had a few stumbles getting his feet tangled up  LIVING ENVIRONMENT: Lives with: lives with their spouse Lives in: House/apartment Stairs: Yes: External: 3+1 steps; on right going up; 1 level house Has following equipment at  home: Walker - 2 wheeled and built-in shower seat  PLOF: Independent, Independent with household mobility without device, Independent with homemaking with ambulation, Independent with  gait, and Leisure: golf  PATIENT GOALS: get back to half normal being able to do yard work, wash a car, and golf  OBJECTIVE:  Note: Objective measures were completed at Evaluation unless otherwise noted.  DIAGNOSTIC FINDINGS:  EXAM: MRI HEAD WITHOUT CONTRAST   TECHNIQUE: Multiplanar, multiecho pulse sequences of the brain and surrounding structures were obtained without intravenous contrast.   COMPARISON:  CT head, CTA, CTP yesterday.  Brain MRI 02/22/2008.  IMPRESSION: 1. Positive for Acute lacunar infarct of the lateral Right Thalamus, but also abnormal diffusion throughout the right hippocampal formation. Both are Right PCA territory, although superimposed seizure related changes of the right hippocampus would be difficult to exclude. No associated hemorrhage or mass effect.   2. 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 No other acute intracranial abnormality identified.   Electronically Signed   By: VEAR Hurst M.D.   On: 07/21/2023 05:51  CTA head:   1. 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 There is some reconstitution of enhancement within right posterior cerebral artery branches beginning at the distal P2 segment level. 2. 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 3. Atherosclerotic plaque within the intracranial internal carotid arteries with no more than mild stenosis.     Electronically Signed   By: Rockey Childs D.O.   On: 07/20/2023 19:32   COGNITION: Overall cognitive status: Within  functional limits for tasks assessed   SENSATION: Light touch: Impaired  and basically absent Proprioception: Impaired  Can only feel deep pressure in L LE  COORDINATION: Incoordinated in L LE, likely due to sensory impairments  EDEMA:  Not formally assessed  MUSCLE TONE: Not formally assessed  MUSCLE LENGTH: Not formally assessed  DTRs:  Not formally assessed  POSTURE: rounded shoulders and forward head  LOWER EXTREMITY ROM:     Active  Right Eval  WFL/WNL throughout Left Eval  WFL/WNL throughout  Hip flexion    Hip extension    Hip abduction    Hip adduction    Hip internal rotation    Hip external rotation    Knee flexion    Knee extension    Ankle dorsiflexion    Ankle plantarflexion    Ankle inversion    Ankle eversion     (Blank rows = not tested)  LOWER EXTREMITY MMT:    MMT Right Eval Left Eval  Hip flexion 4+ 3+  Hip extension    Hip abduction    Hip adduction    Hip internal rotation    Hip external rotation    Knee flexion 4+ 4-  Knee extension 4+ 4+  Ankle dorsiflexion 4+ 3  Ankle plantarflexion 4+ 4-  Ankle inversion    Ankle eversion    (Blank rows = not tested)  Manual Muscle Test Scale 0/5 = No muscle contraction can be seen or felt 1/5 = Contraction can be felt, but there is no motion 2-/5 = Part moves through incomplete ROM w/ gravity decreased 2/5 = Part moves through complete ROM w/ gravity decreased 2+/5 = Part moves through incomplete ROM (<50%) against gravity or through complete ROM w/ gravity 3-/5 = Part moves through incomplete ROM (>50%) against gravity 3/5 = Part moves through complete ROM against gravity 3+/5 =  Part moves through complete ROM against gravity/slight resistance 4-/5= Holds test position against slight to moderate pressure 4/5 = Part moves through complete ROM against gravity/moderate resistance 4+/5= Holds test position against moderate to strong pressure 5/5 = Part moves through complete ROM  against gravity/full resistance  BED MOBILITY:  Not tested, pt denies difficulty with this  TRANSFERS: Sit to stand: SBA and CGA  Assistive device utilized: Environmental consultant - 2 wheeled and None     Stand to sit: SBA and CGA  Assistive device utilized: Environmental consultant - 2 wheeled and None     Chair to chair: SBA and CGA  Assistive device utilized: Environmental consultant - 2 wheeled and None      *requires CGA when not using AD* RAMP:  Not tested  CURB:  Not tested  STAIRS: Findings: Level of Assistance: CGA and Min A, Stair Negotiation Technique: Step to Pattern Forwards with Single Rail on Right, Number of Stairs: 4, Height of Stairs: 6in   , and Comments: incoordinated, but no greater than min A for balance; only used R HR to simulate home environment GAIT: Findings: Gait Characteristics: step through pattern, decreased arm swing- Right, decreased arm swing- Left, decreased step length- Right, decreased step length- Left, decreased stride length, and wide BOS, Distance walked: 61ft + 390ft, Assistive device utilized:None, Level of assistance: CGA and Min A, and Comments: pt uses RW for all ambulation at home, but didn't use AD during session to assess patient's gait without  FUNCTIONAL TESTS:  5 times sit to stand: 15.12 seconds with arms across chest  6 minute walk test: need to assess 10 meter walk test: 0.93 m/s without AD with CGA for safety  Berg Balance Scale: need to assess Functional gait assessment: need to assess   PATIENT SURVEYS:  Stroke Impact Scale 35/80                                                                                                                              TREATMENT DATE: 10/06/2023  Pt arrives to clinic ambulating with SPC.   Weighted gait training x 647ft with 3# AW. Cues for safety in turns and with sudden stop/start to avoid other people in the hall.   Exercises - Standing March with Unilateral Counter Support   - 10 reps - Sit to Stand Without Arm Support   - 10  reps - Side Stepping with Counter Support   - 8 reps- 37ft - Lunge with Counter Support  - 12 reps - Tandem Stance with Support   - 4 reps - 20sec hold bil  - Single Leg Stance with Support   - 4 reps - 20 sec  hold - Backward Walking with Counter Support  - 6 reps 10 ft   Intermittent rest breaks due to mild BLE fatigue. Min cues for improved posture and symmetry of movement for all dynamic tasks.   PATIENT EDUCATION: Education details:Pt educated throughout session about proper posture and  technique with exercises. Improved exercise technique, movement at target joints, use of target muscles after min to mod verbal, visual, tactile cues.  S/s expected with PCA CVA.   Person educated: Patient Education method: Explanation Education comprehension: verbalized understanding and needs further education  HOME EXERCISE PROGRAM:  Access Code: [email or web address] URL: [email or web address] Date: 10/06/2023 Prepared by: Massie Dollar  Exercises - Standing March with Unilateral Counter Support  - 1 x daily - 5 x weekly - 2 sets - 10 reps - Sit to Stand Without Arm Support  - 1 x daily - 5 x weekly - 2 sets - 10 reps - Side Stepping with Counter Support  - 1 x daily - 5 x weekly - 3 sets - 10 reps - Lunge with Counter Support  - 1 x daily - 5 x weekly - 3 sets - 10 reps - Tandem Stance with Support  - 1 x daily - 5 x weekly - 3 sets - 4 reps - 20sec  hold - Single Leg Stance with Support  - 1 x daily - 5 x weekly - 3 sets - 4 reps - 20 sec  hold - Backward Walking with Counter Support  - 1 x daily - 5 x weekly - 3 sets - 6 reps  GOALS: Goals reviewed with patient? Yes  SHORT TERM GOALS: Target date: 09/10/2023  Patient will be independent in home exercise program to improve strength/mobility for better functional independence with ADLs and functional mobility.  Baseline: provided on 5/29  Goal status: In progress   LONG TERM GOALS: Target date: 10/22/2023  Patient (> 50 years old)  will complete five times sit to stand test (5XSTS) in < 12 seconds indicating an increased LE strength and improved balance.  Baseline: 15.12 seconds with arms across chest 6/24: 11.9 sec no UE support  Goal status: INITIAL  2.  Patient will increase Berg Balance score to > 51/56 to demonstrate improved balance and decreased fall risk during functional activities and ADLs.  Baseline: 38/56 6/24: 44/56 Goal status: INITIAL  3.  Patient will increase six minute walk test ( ) distance to >1035ft for progression to community ambulator and improve gait ability  Baseline: 1,027 feet (313 meters, Avg speed 0.863m/s), no AD, with CGA and occasional min A 6/24: 1167ft no  Goal status: INITIAL  4.  Patient will increase 10 meter walk test to >1.56m/s as to improve gait speed for better community ambulation and to reduce fall risk. Baseline: 0.93 m/s without AD with CGA  6/24: 0.31ms  Goal status: INITIAL  5.  Patient will increase Functional Gait Assessment (FGA) score to >20/30 as to reduce fall risk and improve dynamic gait safety with community ambulation.  Baseline: 5/30 Deferred due to time Goal status: INITIAL  6.  Patient will improve Stroke Impact Scale-16 by >9 points indicating patient experiencing increased independence with functional mobility tasks and ADLs to improve quality of life and decrease caregiver burden.  Baseline: 35/80 6/24: 69/80 Goal status: INITIAL  ASSESSMENT:  CLINICAL IMPRESSION:  Patient arrived motivated to participate in therapy. PT treatment focused on activity tolerance, dynamic balance training and HEP progression.  Pt demonstrated understanding and proper technique for each intervention to improve functional mobility and balance. Hand out provided with instruction to complete on days not in PT.  Slight difficulty with reverse gait, but improved with increased repetitions. Pt will continue to benefit from skilled physical therapy interventions to address  impairments, improve QOL, and attain therapy goals.  OBJECTIVE IMPAIRMENTS: Abnormal gait, decreased activity tolerance, decreased balance, decreased coordination, decreased endurance, decreased knowledge of condition, decreased knowledge of use of DME, decreased mobility, difficulty walking, decreased strength, increased fascial restrictions, impaired flexibility, impaired sensation, impaired UE functional use, improper body mechanics, and pain.   ACTIVITY LIMITATIONS: carrying, lifting, bending, sitting, standing, squatting, stairs, transfers, bathing, toileting, dressing, reach over head, hygiene/grooming, and locomotion level  PARTICIPATION LIMITATIONS: meal prep, cleaning, laundry, interpersonal relationship, driving, shopping, community activity, and yard work  PERSONAL FACTORS: Age, Fitness, Time since onset of injury/illness/exacerbation, and 3+ comorbidities: Hyperlipidemia, BPH, HTN, hx of basal cell carcinoma, hx of squamous cell carcinoma, arthritis, R and L reverse shoulder arthroplasties (R in 2024 and L in 2017) are also affecting patient's functional outcome.   REHAB POTENTIAL: Excellent  CLINICAL DECISION MAKING: Evolving/moderate complexity  EVALUATION COMPLEXITY: Moderate  PLAN:  PT FREQUENCY: 1-2x/week  PT DURATION: 12 weeks  PLANNED INTERVENTIONS: 97164- PT Re-evaluation, 97750- Physical Performance Testing, 97110-Therapeutic exercises, 97530- Therapeutic activity, 97112- Neuromuscular re-education, 97535- Self Care, [id number]- Manual therapy, [phone]- Gait training, [phone]- Orthotic Initial, [phone]- Orthotic/Prosthetic subsequent, [phone]- Canalith repositioning, [phone]- Electrical stimulation (manual), Patient/Family education, Balance training, Stair training, Taping, Dry Needling, Joint mobilization, Joint manipulation, Spinal mobilization, Vestibular training, Visual/preceptual remediation/compensation, DME instructions, Cryotherapy, Moist heat, and Biofeedback  PLAN FOR  NEXT SESSION:  - participate in higher level dynamic balance and gait challenges including:  - horizontal and vertical head turns  - stepping towards external targets due to LLE incoordination  - gait in variable directions  - unstable surfaces - step-ups for B LE strengthening with simultaneous dynamic balance tasks - progress to practicing golf swing for return to recreational activities  Massie Dollar PT, DPT  Physical Therapist - [facility]  [facility]  10:18 AM 10/06/23

## 2023-10-08 ENCOUNTER — Other Ambulatory Visit: Payer: Self-pay | Admitting: Urology

## 2023-10-08 DIAGNOSIS — N401 Enlarged prostate with lower urinary tract symptoms: Secondary | ICD-10-CM

## 2023-10-09 ENCOUNTER — Ambulatory Visit: Attending: Internal Medicine | Admitting: Physical Therapy

## 2023-10-09 DIAGNOSIS — R2681 Unsteadiness on feet: Secondary | ICD-10-CM | POA: Diagnosis present

## 2023-10-09 DIAGNOSIS — I69352 Hemiplegia and hemiparesis following cerebral infarction affecting left dominant side: Secondary | ICD-10-CM | POA: Diagnosis present

## 2023-10-09 DIAGNOSIS — M6281 Muscle weakness (generalized): Secondary | ICD-10-CM | POA: Diagnosis present

## 2023-10-09 DIAGNOSIS — R278 Other lack of coordination: Secondary | ICD-10-CM | POA: Diagnosis present

## 2023-10-09 DIAGNOSIS — R269 Unspecified abnormalities of gait and mobility: Secondary | ICD-10-CM | POA: Diagnosis present

## 2023-10-09 DIAGNOSIS — R262 Difficulty in walking, not elsewhere classified: Secondary | ICD-10-CM | POA: Diagnosis present

## 2023-10-09 NOTE — Therapy (Signed)
 OUTPATIENT PHYSICAL THERAPY NEURO TREATMENT/ Physical Therapy Progress Note   Dates of reporting period  09/03/23   to   10/09/23      Patient Name: George Bruce MRN: [id number] DOB:[birth year], 78 y.o., male Today's Date: 10/09/2023   PCP: Auston Reyes BIRCH, MD  REFERRING PROVIDER: Auston Reyes BIRCH, MD   END OF SESSION:   PT End of Session - 10/09/23 0932     Visit Number 20    Number of Visits 24    Date for PT Re-Evaluation 10/22/23    Progress Note Due on Visit 10    PT Start Time 0933    PT Stop Time 1014    PT Time Calculation (min) 41 min    Equipment Utilized During Treatment Gait belt    Activity Tolerance Patient tolerated treatment well    Behavior During Therapy WFL for tasks assessed/performed            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   Hypertension    Wears dentures    partial upper and lower   Past Surgical History:  Procedure Laterality Date   CATARACT EXTRACTION W/PHACO Right 09/29/2022   Procedure: CATARACT EXTRACTION PHACO AND INTRAOCULAR LENS PLACEMENT (IOC) RIGHT  10.98  00:56.5;  Surgeon: Myrna Adine Anes, MD;  Location: [facility];  Service: Ophthalmology;  Laterality: Right;   CATARACT EXTRACTION W/PHACO Left 10/13/2022   Procedure: CATARACT EXTRACTION PHACO AND INTRAOCULAR LENS PLACEMENT (IOC) LEFT 10.79 00:54.1;  Surgeon: Myrna Adine Anes, MD;  Location: [facility];  Service: Ophthalmology;  Laterality: Left;   COLONOSCOPY WITH PROPOFOL  N/A 04/21/2018   Procedure: COLONOSCOPY WITH PROPOFOL ;  Surgeon: Janalyn Keene NOVAK, MD;  Location: [facility];  Service: Endoscopy;  Laterality: N/A;   INGUINAL HERNIA REPAIR Left 1990's   INGUINAL HERNIA REPAIR Right 2015   REVERSE SHOULDER  ARTHROPLASTY Left 11/06/2015   Procedure: REVERSE SHOULDER ARTHROPLASTY;  Surgeon: Norleen JINNY Maltos, MD;  Location: [facility];  Service: Orthopedics;  Laterality: Left;   REVERSE SHOULDER ARTHROPLASTY Right 03/25/2022   Procedure: REVERSE SHOULDER ARTHROPLASTY;  Surgeon: Maltos Norleen JINNY, MD;  Location: [facility];  Service: Orthopedics;  Laterality: Right;  MAKE 2ND CASE   Patient Active Problem List   Diagnosis Date Noted   Acute CVA (cerebrovascular accident) (HCC) 07/21/2023   Elevated blood pressure reading 07/21/2023   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   Acute diverticulitis 05/07/2018   History of colonic polyps    Benign neoplasm of ascending colon    Diverticulosis of large intestine without diverticulitis    Erectile dysfunction due to arterial insufficiency 02/18/2018   Degenerative disc disease, lumbar 08/26/2016   Bilateral hip pain 06/24/2016   Chronic midline low back pain 06/24/2016   Polyarthralgia 06/24/2016   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   Hematospermia 05/03/2015   Colon polyps 08/16/2013   Hematuria 08/16/2013   Hyperlipidemia 08/16/2013   Incomplete emptying of bladder 06/23/2013   Benign prostatic hyperplasia with incomplete bladder emptying 11/11/2011   Elevated PSA 11/11/2011   Reduced libido 11/11/2011    ONSET DATE: 07/20/2023 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,  right PCA occlusion   REFERRING DIAG: I63.50 (ICD-10-CM) - Cerebral infarction due to unspecified occlusion or stenosis of unspecified cerebral artery   THERAPY DIAG:  Unsteadiness on feet  Abnormality of gait  Difficulty in walking, not elsewhere classified  Rationale for Evaluation and Treatment: Rehabilitation  SUBJECTIVE:                                                                                                                                                                                              SUBJECTIVE STATEMENT:  Pt reports he feels about 70% better but still not where he needs to be. Still feels unable to golf at this time.   Accompanied by self today    From Initial Eval: Pt states he has hx of sciatica in L LE. Pt states recent CVA affected his L side and he is having numbness in L UE and LE. Pt states the CVA affected him from his jaw down through his L arm and L LE. Pt states he is left hand dominant. Pt states he was walking ~61miles daily and performing all household chores prior to CVA. Patient states he was very active and completely independent prior.   Pt states he had MRI yesterday for L LE sciatica and is going to see MD tomorrow to follow-up on results (with [facility]). Patient reporting current pain as 6/10 with it being greater this morning, but he took pain medication to ease it off prior to coming to therapy.   Pt accompanied by: significant other, Wife, [name]  PERTINENT HISTORY: PMH of Hyperlipidemia, BPH, HTN, hx of basal cell carcinoma, hx of squamous cell carcinoma, arthritis, R and L reverse shoulder arthroplasties (R in 2024 and L in 2017)  PAIN:  Are you having pain? Yes: NPRS scale: 6/10 after taking pain medication this AM Pain location: L LE sciatica Pain description: severe ache Aggravating factors: he can wake up with it, no specific aggravating factors Relieving factors: pt reports having to take pain medication (hydrocodone ) this AM, hx of having injections and prednisone ; uses horse liniment   PRECAUTIONS: Fall  RED FLAGS: None   WEIGHT BEARING RESTRICTIONS: No  FALLS: Has patient fallen in last 6 months? No and but states had a few stumbles getting his feet tangled up  LIVING ENVIRONMENT: Lives with: lives with their spouse Lives in: House/apartment Stairs: Yes: External: 3+1 steps; on right going up; 1 level house Has following equipment at home: Walker - 2 wheeled and built-in shower seat  PLOF: Independent,  Independent with household mobility without device, Independent with homemaking with ambulation, Independent with gait, and Leisure: golf  PATIENT GOALS: get back to half normal being able to do yard work, wash a car, and golf  OBJECTIVE:  Note: Objective measures were completed at Evaluation unless otherwise noted.  DIAGNOSTIC FINDINGS:  EXAM: MRI HEAD WITHOUT CONTRAST   TECHNIQUE: Multiplanar, multiecho pulse sequences of the brain and surrounding structures were obtained without intravenous contrast.   COMPARISON:  CT head, CTA, CTP yesterday.  Brain MRI 02/22/2008.  IMPRESSION: 1. Positive for Acute lacunar infarct of the lateral Right Thalamus, but also abnormal diffusion throughout the right hippocampal formation. Both are Right PCA territory, although superimposed seizure related changes of the right hippocampus would be difficult to exclude. No associated hemorrhage or mass effect.   2. 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 No other acute intracranial abnormality identified.   Electronically Signed   By: VEAR Hurst M.D.   On: 07/21/2023 05:51  CTA head:   1. 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 There is some reconstitution of enhancement within right posterior cerebral artery branches beginning at the distal P2 segment level. 2. 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 3. Atherosclerotic plaque within the intracranial internal carotid arteries with no more than mild stenosis.     Electronically Signed   By: Rockey Childs D.O.   On: 07/20/2023 19:32   COGNITION: Overall cognitive status: Within functional limits for tasks assessed   SENSATION: Light touch: Impaired  and  basically absent Proprioception: Impaired  Can only feel deep pressure in L LE  COORDINATION: Incoordinated in L LE, likely due to sensory impairments  EDEMA:  Not formally assessed  MUSCLE TONE: Not formally assessed  MUSCLE LENGTH: Not formally assessed  DTRs:  Not formally assessed  POSTURE: rounded shoulders and forward head  LOWER EXTREMITY ROM:     Active  Right Eval  WFL/WNL throughout Left Eval  WFL/WNL throughout  Hip flexion    Hip extension    Hip abduction    Hip adduction    Hip internal rotation    Hip external rotation    Knee flexion    Knee extension    Ankle dorsiflexion    Ankle plantarflexion    Ankle inversion    Ankle eversion     (Blank rows = not tested)  LOWER EXTREMITY MMT:    MMT Right Eval Left Eval  Hip flexion 4+ 3+  Hip extension    Hip abduction    Hip adduction    Hip internal rotation    Hip external rotation    Knee flexion 4+ 4-  Knee extension 4+ 4+  Ankle dorsiflexion 4+ 3  Ankle plantarflexion 4+ 4-  Ankle inversion    Ankle eversion    (Blank rows = not tested)  Manual Muscle Test Scale 0/5 = No muscle contraction can be seen or felt 1/5 = Contraction can be felt, but there is no motion 2-/5 = Part moves through incomplete ROM w/ gravity decreased 2/5 = Part moves through complete ROM w/ gravity decreased 2+/5 = Part moves through incomplete ROM (<50%) against gravity or through complete ROM w/ gravity 3-/5 = Part moves through incomplete ROM (>50%) against gravity 3/5 = Part moves through complete ROM against gravity 3+/5 = Part moves through complete  ROM against gravity/slight resistance 4-/5= Holds test position against slight to moderate pressure 4/5 = Part moves through complete ROM against gravity/moderate resistance 4+/5= Holds test position against moderate to strong pressure 5/5 = Part moves through complete ROM against gravity/full resistance  BED MOBILITY:  Not tested, pt denies difficulty  with this  TRANSFERS: Sit to stand: SBA and CGA  Assistive device utilized: Environmental consultant - 2 wheeled and None     Stand to sit: SBA and CGA  Assistive device utilized: Environmental consultant - 2 wheeled and None     Chair to chair: SBA and CGA  Assistive device utilized: Environmental consultant - 2 wheeled and None      *requires CGA when not using AD* RAMP:  Not tested  CURB:  Not tested  STAIRS: Findings: Level of Assistance: CGA and Min A, Stair Negotiation Technique: Step to Pattern Forwards with Single Rail on Right, Number of Stairs: 4, Height of Stairs: 6in   , and Comments: incoordinated, but no greater than min A for balance; only used R HR to simulate home environment GAIT: Findings: Gait Characteristics: step through pattern, decreased arm swing- Right, decreased arm swing- Left, decreased step length- Right, decreased step length- Left, decreased stride length, and wide BOS, Distance walked: 43ft + 314ft, Assistive device utilized:None, Level of assistance: CGA and Min A, and Comments: pt uses RW for all ambulation at home, but didn't use AD during session to assess patient's gait without  FUNCTIONAL TESTS:  5 times sit to stand: 15.12 seconds with arms across chest  6 minute walk test: need to assess 10 meter walk test: 0.93 m/s without AD with CGA for safety  Berg Balance Scale: need to assess Functional gait assessment: need to assess   PATIENT SURVEYS:  Stroke Impact Scale 35/80                                                                                                                              TREATMENT DATE: 10/09/2023 Physical therapy treatment session today consisted of completing assessment of goals and administration of testing as demonstrated and documented in flow sheet, treatment, and goals section of this note. Addition treatments may be found below.   PHYSICAL PERFORMANCE    [facility] PT Assessment - 10/09/23 0001       Berg Balance Test   Sit to Stand Able to stand without using hands  and stabilize independently    Standing Unsupported Able to stand safely 2 minutes    Sitting with Back Unsupported but Feet Supported on Floor or Stool Able to sit safely and securely 2 minutes    Stand to Sit Sits safely with minimal use of hands    Transfers Able to transfer safely, minor use of hands    Standing Unsupported with Eyes Closed Able to stand 10 seconds safely    Standing Unsupported with Feet Together Able to place feet together independently and stand 1 minute safely  From Standing, Reach Forward with Outstretched Arm Can reach confidently >25 cm (10)    From Standing Position, Pick up Object from Floor Able to pick up shoe safely and easily    From Standing Position, Turn to Look Behind Over each Shoulder Looks behind from both sides and weight shifts well    Turn 360 Degrees Able to turn 360 degrees safely in 4 seconds or less    Standing Unsupported, Alternately Place Feet on Step/Stool Able to stand independently and safely and complete 8 steps in 20 seconds    Standing Unsupported, One Foot in Front Able to place foot tandem independently and hold 30 seconds    Standing on One Leg Tries to lift leg/unable to hold 3 seconds but remains standing independently    Total Score 53      Functional Gait  Assessment   Gait assessed  Yes    Gait Level Surface Walks 20 ft in less than 5.5 sec, no assistive devices, good speed, no evidence for imbalance, normal gait pattern, deviates no more than 6 in outside of the 12 in walkway width.    Change in Gait Speed Able to change speed, demonstrates mild gait deviations, deviates 6-10 in outside of the 12 in walkway width, or no gait deviations, unable to achieve a major change in velocity, or uses a change in velocity, or uses an assistive device.    Gait with Horizontal Head Turns Performs head turns smoothly with slight change in gait velocity (eg, minor disruption to smooth gait path), deviates 6-10 in outside 12 in walkway width, or  uses an assistive device.    Gait with Vertical Head Turns Performs task with slight change in gait velocity (eg, minor disruption to smooth gait path), deviates 6 - 10 in outside 12 in walkway width or uses assistive device    Gait and Pivot Turn Pivot turns safely in greater than 3 sec and stops with no loss of balance, or pivot turns safely within 3 sec and stops with mild imbalance, requires small steps to catch balance.    Step Over Obstacle Is able to step over one shoe box (4.5 in total height) but must slow down and adjust steps to clear box safely. May require verbal cueing.    Gait with Narrow Base of Support Ambulates 7-9 steps.    Gait with Eyes Closed Walks 20 ft, uses assistive device, slower speed, mild gait deviations, deviates 6-10 in outside 12 in walkway width. Ambulates 20 ft in less than 9 sec but greater than 7 sec.    Ambulating Backwards Walks 20 ft, no assistive devices, good speed, no evidence for imbalance, normal gait    Steps Two feet to a stair, must use rail.    Total Score 20          Pt participated in Functional Gait Assessment (FGA) with score of 20/30 demonstrating medium fall risk (low fall risk 25-28, medium fall risk 19-24, and high fall risk <19).  Patient demonstrates increased fall risk as noted by score of  53 /56 on Berg Balance Scale.  (<36= high risk for falls, close to 100%; 37-45 significant >80%; 46-51 moderate >50%; 52-55 lower >25%)   TA- To improve functional movements patterns for everyday tasks   Goff simulation activity with patient standing on unsteady surface putting golf balls in approximately 10 feet he.  Patient also has to reach for golf balls with golf club while standing on [employer] beam and getting off  ball and correct positioning between feet for shots to work on coordination, motor planning, and balance. X 15 min   PATIENT EDUCATION: Education details:Pt educated throughout session about proper posture and technique with  exercises. Improved exercise technique, movement at target joints, use of target muscles after min to mod verbal, visual, tactile cues.  S/s expected with PCA CVA.   Person educated: Patient Education method: Explanation Education comprehension: verbalized understanding and needs further education  HOME EXERCISE PROGRAM:  Access Code: [email or web address] URL: [email or web address] Date: 10/06/2023 Prepared by: Massie Dollar  Exercises - Standing March with Unilateral Counter Support  - 1 x daily - 5 x weekly - 2 sets - 10 reps - Sit to Stand Without Arm Support  - 1 x daily - 5 x weekly - 2 sets - 10 reps - Side Stepping with Counter Support  - 1 x daily - 5 x weekly - 3 sets - 10 reps - Lunge with Counter Support  - 1 x daily - 5 x weekly - 3 sets - 10 reps - Tandem Stance with Support  - 1 x daily - 5 x weekly - 3 sets - 4 reps - 20sec  hold - Single Leg Stance with Support  - 1 x daily - 5 x weekly - 3 sets - 4 reps - 20 sec  hold - Backward Walking with Counter Support  - 1 x daily - 5 x weekly - 3 sets - 6 reps  GOALS: Goals reviewed with patient? Yes  SHORT TERM GOALS: Target date: 09/10/2023  Patient will be independent in home exercise program to improve strength/mobility for better functional independence with ADLs and functional mobility.  Baseline: provided on 5/29  Goal status: In progress   LONG TERM GOALS: Target date: 10/22/2023  Patient (> 28 years old) will complete five times sit to stand test (5XSTS) in < 12 seconds indicating an increased LE strength and improved balance.  Baseline: 15.12 seconds with arms across chest 6/24: 11.9 sec no UE support  Goal status: MET  2.  Patient will increase Berg Balance score to > 51/56 to demonstrate improved balance and decreased fall risk during functional activities and ADLs.  Baseline: 38/56 6/24: 44/56 8/1: 53 Goal status: INITIAL  3.  Patient will increase six minute walk test ( ) distance to >1036ft for  progression to community ambulator and improve gait ability  Baseline: 1,027 feet (313 meters, Avg speed 0.859m/s), no AD, with CGA and occasional min A 6/24: 1139ft no  Goal status: MET  4.  Patient will increase 10 meter walk test to >1.72m/s as to improve gait speed for better community ambulation and to reduce fall risk. Baseline: 0.93 m/s without AD with CGA  6/24: 0.94ms  8/1: 1.05 m/s  Goal status: MET  5.  Patient will increase Functional Gait Assessment (FGA) score to >20/30 as to reduce fall risk and improve dynamic gait safety with community ambulation.  Baseline:  8/1:20 Goal status: ONGOING  6.  Patient will improve Stroke Impact Scale-16 by >9 points indicating patient experiencing increased independence with functional mobility tasks and ADLs to improve quality of life and decrease caregiver burden.  Baseline: 35/80 6/24: 69/80 Goal status: MET  ASSESSMENT:  CLINICAL IMPRESSION:   Patient presents to physical therapy for progress note this date.  Patient shows excellent progress toward all of his long-term goals showing significantly reduced risk factor for falls at this time.  Patient feels he is about 70% better and still does not feel  comfortable returning to his previous level of activity including golfing and long walks around his neighborhood.  Will continue to progress with higher-level dynamic balance activities as well as functional activities such as golfing and walking on unlevel surfaces and outdoor environments. Patient's condition has the potential to improve in response to therapy. Maximum improvement is yet to be obtained. The anticipated improvement is attainable and reasonable in a generally predictable time. Pt will continue to benefit from skilled physical therapy intervention to address impairments, improve QOL, and attain therapy goals.    OBJECTIVE IMPAIRMENTS: Abnormal gait, decreased activity tolerance, decreased balance, decreased coordination,  decreased endurance, decreased knowledge of condition, decreased knowledge of use of DME, decreased mobility, difficulty walking, decreased strength, increased fascial restrictions, impaired flexibility, impaired sensation, impaired UE functional use, improper body mechanics, and pain.   ACTIVITY LIMITATIONS: carrying, lifting, bending, sitting, standing, squatting, stairs, transfers, bathing, toileting, dressing, reach over head, hygiene/grooming, and locomotion level  PARTICIPATION LIMITATIONS: meal prep, cleaning, laundry, interpersonal relationship, driving, shopping, community activity, and yard work  PERSONAL FACTORS: Age, Fitness, Time since onset of injury/illness/exacerbation, and 3+ comorbidities: Hyperlipidemia, BPH, HTN, hx of basal cell carcinoma, hx of squamous cell carcinoma, arthritis, R and L reverse shoulder arthroplasties (R in 2024 and L in 2017) are also affecting patient's functional outcome.   REHAB POTENTIAL: Excellent  CLINICAL DECISION MAKING: Evolving/moderate complexity  EVALUATION COMPLEXITY: Moderate  PLAN:  PT FREQUENCY: 1-2x/week  PT DURATION: 12 weeks  PLANNED INTERVENTIONS: 97164- PT Re-evaluation, 97750- Physical Performance Testing, 97110-Therapeutic exercises, 97530- Therapeutic activity, [id number]- Neuromuscular re-education, 97535- Self Care, [id number]- Manual therapy, [id number]- Gait training, [phone]- Orthotic Initial, [phone]- Orthotic/Prosthetic subsequent, [phone]- Canalith repositioning, [phone]- Electrical stimulation (manual), Patient/Family education, Balance training, Stair training, Taping, Dry Needling, Joint mobilization, Joint manipulation, Spinal mobilization, Vestibular training, Visual/preceptual remediation/compensation, DME instructions, Cryotherapy, Moist heat, and Biofeedback  PLAN FOR NEXT SESSION:  - participate in higher level dynamic balance and gait challenges including:  - horizontal and vertical head turns  - stepping towards external targets  due to LLE incoordination  - gait in variable directions  - unstable surfaces - step-ups for B LE strengthening with simultaneous dynamic balance tasks - progress to practicing golf swing for return to recreational activities - Ascending and descending stairs without UE  - 1 leg balance progressive activities   Note: Portions of this document were prepared using Dragon voice recognition software and although reviewed may contain unintentional dictation errors in syntax, grammar, or spelling.  Lonni KATHEE Gainer PT ,DPT Physical Therapist- [place]  [facility]   9:53 AM 10/09/23

## 2023-10-13 ENCOUNTER — Ambulatory Visit: Admitting: Physical Therapy

## 2023-10-13 ENCOUNTER — Ambulatory Visit: Admitting: Occupational Therapy

## 2023-10-13 DIAGNOSIS — R262 Difficulty in walking, not elsewhere classified: Secondary | ICD-10-CM

## 2023-10-13 DIAGNOSIS — R2681 Unsteadiness on feet: Secondary | ICD-10-CM | POA: Diagnosis not present

## 2023-10-13 DIAGNOSIS — M6281 Muscle weakness (generalized): Secondary | ICD-10-CM

## 2023-10-13 DIAGNOSIS — R269 Unspecified abnormalities of gait and mobility: Secondary | ICD-10-CM

## 2023-10-13 DIAGNOSIS — R278 Other lack of coordination: Secondary | ICD-10-CM

## 2023-10-13 NOTE — Therapy (Signed)
 OUTPATIENT PHYSICAL THERAPY NEURO TREATMENT     Patient Name: George Bruce MRN: [id number] DOB:[birth year], 78 y.o., male Today's Date: 10/13/2023   PCP: Auston Reyes BIRCH, MD  REFERRING PROVIDER: Auston Reyes BIRCH, MD   END OF SESSION:   PT End of Session - 10/13/23 0934     Visit Number 21    Number of Visits 24    Date for PT Re-Evaluation 10/22/23    Progress Note Due on Visit 30    PT Start Time 0932    PT Stop Time 1012    PT Time Calculation (min) 40 min    Equipment Utilized During Treatment Gait belt    Activity Tolerance Patient tolerated treatment well    Behavior During Therapy WFL for tasks assessed/performed           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   Hypertension    Wears dentures    partial upper and lower   Past Surgical History:  Procedure Laterality Date   CATARACT EXTRACTION W/PHACO Right 09/29/2022   Procedure: CATARACT EXTRACTION PHACO AND INTRAOCULAR LENS PLACEMENT (IOC) RIGHT  10.98  00:56.5;  Surgeon: Myrna Adine Anes, MD;  Location: [facility];  Service: Ophthalmology;  Laterality: Right;   CATARACT EXTRACTION W/PHACO Left 10/13/2022   Procedure: CATARACT EXTRACTION PHACO AND INTRAOCULAR LENS PLACEMENT (IOC) LEFT 10.79 00:54.1;  Surgeon: Myrna Adine Anes, MD;  Location: [facility];  Service: Ophthalmology;  Laterality: Left;   COLONOSCOPY WITH PROPOFOL  N/A 04/21/2018   Procedure: COLONOSCOPY WITH PROPOFOL ;  Surgeon: Janalyn Keene NOVAK, MD;  Location: [facility];  Service: Endoscopy;  Laterality: N/A;   INGUINAL HERNIA REPAIR Left 1990's   INGUINAL HERNIA REPAIR Right 2015   REVERSE SHOULDER ARTHROPLASTY Left 11/06/2015   Procedure: REVERSE SHOULDER ARTHROPLASTY;  Surgeon: Norleen JINNY Maltos, MD;  Location: [facility];  Service: Orthopedics;  Laterality: Left;   REVERSE SHOULDER ARTHROPLASTY Right 03/25/2022   Procedure: REVERSE SHOULDER ARTHROPLASTY;  Surgeon: Maltos Norleen JINNY, MD;  Location: [facility];  Service: Orthopedics;  Laterality: Right;  MAKE 2ND CASE   Patient Active Problem List   Diagnosis Date Noted   Acute CVA (cerebrovascular accident) (HCC) 07/21/2023   Elevated blood pressure reading 07/21/2023   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   Acute diverticulitis 05/07/2018   History of colonic polyps    Benign neoplasm of ascending colon    Diverticulosis of large intestine without diverticulitis    Erectile dysfunction due to arterial insufficiency 02/18/2018   Degenerative disc disease, lumbar 08/26/2016   Bilateral hip pain 06/24/2016   Chronic midline low back pain 06/24/2016   Polyarthralgia 06/24/2016   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   Hematospermia 05/03/2015   Colon polyps 08/16/2013   Hematuria 08/16/2013   Hyperlipidemia 08/16/2013   Incomplete emptying of bladder 06/23/2013   Benign prostatic hyperplasia with incomplete bladder emptying 11/11/2011   Elevated PSA 11/11/2011   Reduced libido 11/11/2011    ONSET DATE: 07/20/2023 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 Pt denies any  recent falls/stumbles since prior session. Pt denies any updates to medications or medical appointment since prior session. Pt reports good compliance with HEP when time permits.   Accompanied by self today  From Initial Eval: Pt states he has hx of sciatica in L LE. Pt states recent CVA affected his L side and he is having numbness in L UE and LE. Pt states the CVA affected him from his jaw down through his L arm and L LE. Pt states he is left hand dominant. Pt states he was walking ~45miles daily and performing all household chores prior to CVA. Patient states he was very active and completely independent prior.   Pt states he had MRI yesterday for L LE sciatica and is going to see MD tomorrow to follow-up on results (with [facility]). Patient reporting current pain as 6/10 with it being greater this morning, but he took pain medication to ease it off prior to coming to therapy.   Pt accompanied by: significant other, Wife, [name]  PERTINENT HISTORY: PMH of Hyperlipidemia, BPH, HTN, hx of basal cell carcinoma, hx of squamous cell carcinoma, arthritis, R and L reverse shoulder arthroplasties (R in 2024 and L in 2017)  PAIN:  Are you having pain? Yes: NPRS scale: 6/10 after taking pain medication this AM Pain location: L LE sciatica Pain description: severe ache Aggravating factors: he can wake up with it, no specific aggravating factors Relieving factors: pt reports having to take pain medication (hydrocodone ) this AM, hx of having injections and prednisone ; uses horse liniment   PRECAUTIONS: Fall  RED FLAGS: None   WEIGHT BEARING RESTRICTIONS: No  FALLS: Has patient fallen in last 6 months? No and but states had a few stumbles getting his feet tangled up  LIVING ENVIRONMENT: Lives with: lives with their spouse Lives in: House/apartment Stairs: Yes: External: 3+1 steps; on right going up; 1 level house Has following equipment at home: Walker - 2 wheeled and built-in shower  seat  PLOF: Independent, Independent with household mobility without device, Independent with homemaking with ambulation, Independent with gait, and Leisure: golf  PATIENT GOALS: get back to half normal being able to do  yard work, wash a car, and golf  OBJECTIVE:  Note: Objective measures were completed at Evaluation unless otherwise noted.  DIAGNOSTIC FINDINGS:  EXAM: MRI HEAD WITHOUT CONTRAST   TECHNIQUE: Multiplanar, multiecho pulse sequences of the brain and surrounding structures were obtained without intravenous contrast.   COMPARISON:  CT head, CTA, CTP yesterday.  Brain MRI 02/22/2008.  IMPRESSION: 1. Positive for Acute lacunar infarct of the lateral Right Thalamus, but also abnormal diffusion throughout the right hippocampal formation. Both are Right PCA territory, although superimposed seizure related changes of the right hippocampus would be difficult to exclude. No associated hemorrhage or mass effect.   2. 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 No other acute intracranial abnormality identified.   Electronically Signed   By: VEAR Hurst M.D.   On: 07/21/2023 05:51  CTA head:   1. 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 There is some reconstitution of enhancement within right posterior cerebral artery branches beginning at the distal P2 segment level. 2. 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 3. Atherosclerotic plaque within the intracranial internal carotid arteries with no more than mild stenosis.     Electronically Signed   By: Rockey Childs D.O.   On: 07/20/2023 19:32   COGNITION: Overall cognitive status: Within functional limits for tasks  assessed   SENSATION: Light touch: Impaired  and basically absent Proprioception: Impaired  Can only feel deep pressure in L LE  COORDINATION: Incoordinated in L LE, likely due to sensory impairments  EDEMA:  Not formally assessed  MUSCLE TONE: Not formally assessed  MUSCLE LENGTH: Not formally assessed  DTRs:  Not formally assessed  POSTURE: rounded shoulders and forward head  LOWER EXTREMITY ROM:     Active  Right Eval  WFL/WNL throughout Left Eval  WFL/WNL throughout  Hip flexion    Hip extension    Hip abduction    Hip adduction    Hip internal rotation    Hip external rotation    Knee flexion    Knee extension    Ankle dorsiflexion    Ankle plantarflexion    Ankle inversion    Ankle eversion     (Blank rows = not tested)  LOWER EXTREMITY MMT:    MMT Right Eval Left Eval  Hip flexion 4+ 3+  Hip extension    Hip abduction    Hip adduction    Hip internal rotation    Hip external rotation    Knee flexion 4+ 4-  Knee extension 4+ 4+  Ankle dorsiflexion 4+ 3  Ankle plantarflexion 4+ 4-  Ankle inversion    Ankle eversion    (Blank rows = not tested)  Manual Muscle Test Scale 0/5 = No muscle contraction can be seen or felt 1/5 = Contraction can be felt, but there is no motion 2-/5 = Part moves through incomplete ROM w/ gravity decreased 2/5 = Part moves through complete ROM w/ gravity decreased 2+/5 = Part moves through incomplete ROM (<50%) against gravity or through complete ROM w/ gravity 3-/5 = Part moves through incomplete ROM (>50%) against gravity 3/5 = Part moves through complete ROM against gravity 3+/5 = Part moves through complete ROM against gravity/slight resistance 4-/5= Holds test position against slight to  moderate pressure 4/5 = Part moves through complete ROM against gravity/moderate resistance 4+/5= Holds test position against moderate to strong pressure 5/5 = Part moves through complete ROM against gravity/full  resistance  BED MOBILITY:  Not tested, pt denies difficulty with this  TRANSFERS: Sit to stand: SBA and CGA  Assistive device utilized: Environmental consultant - 2 wheeled and None     Stand to sit: SBA and CGA  Assistive device utilized: Environmental consultant - 2 wheeled and None     Chair to chair: SBA and CGA  Assistive device utilized: Environmental consultant - 2 wheeled and None      *requires CGA when not using AD* RAMP:  Not tested  CURB:  Not tested  STAIRS: Findings: Level of Assistance: CGA and Min A, Stair Negotiation Technique: Step to Pattern Forwards with Single Rail on Right, Number of Stairs: 4, Height of Stairs: 6in   , and Comments: incoordinated, but no greater than min A for balance; only used R HR to simulate home environment GAIT: Findings: Gait Characteristics: step through pattern, decreased arm swing- Right, decreased arm swing- Left, decreased step length- Right, decreased step length- Left, decreased stride length, and wide BOS, Distance walked: 28ft + 387ft, Assistive device utilized:None, Level of assistance: CGA and Min A, and Comments: pt uses RW for all ambulation at home, but didn't use AD during session to assess patient's gait without  FUNCTIONAL TESTS:  5 times sit to stand: 15.12 seconds with arms across chest  6 minute walk test: need to assess 10 meter walk test: 0.93 m/s without AD with CGA for safety  Berg Balance Scale: need to assess Functional gait assessment: need to assess   PATIENT SURVEYS:  Stroke Impact Scale 35/80                                                                                                                              TREATMENT DATE: 10/13/2023  Gait training / TA Gait all around hospital with 2.5# AW x 25 min ( stairs, hills, navigating around people, etc) - cues for foot clearance   TA- To improve functional movements patterns for everyday tasks   Step up 2 x 10 ea LE  - participate in higher level dynamic balance and gait challenges including:  NMR: To  facilitate reeducation of movement, balance, posture, coordination, and/or proprioception/kinesthetic sense.  Patient completes blaze pod activity while standing on Airex pad.  Patient has 4 blaze pods placed on left side for 2 rounds and then plates pods moved to right side for 2 rounds.  Patient completes 1 minute of dual task motor and cognitive by tapping foot laterally and anteriorly as well as having to name a college sports team that correlates with the color that the blaze pod turns to randomly.  Patient has increased difficulty with and utilizing left lower extremity for balance and tapping pots with right lower extremity.  Requires cues for proper placement of lower extremity to  prevent loss of balance.   PATIENT EDUCATION: Education details:Pt educated throughout session about proper posture and technique with exercises. Improved exercise technique, movement at target joints, use of target muscles after min to mod verbal, visual, tactile cues.  S/s expected with PCA CVA.   Person educated: Patient Education method: Explanation Education comprehension: verbalized understanding and needs further education  HOME EXERCISE PROGRAM:  Access Code: [email or web address] URL: [email or web address] Date: 10/06/2023 Prepared by: Massie Dollar  Exercises - Standing March with Unilateral Counter Support  - 1 x daily - 5 x weekly - 2 sets - 10 reps - Sit to Stand Without Arm Support  - 1 x daily - 5 x weekly - 2 sets - 10 reps - Side Stepping with Counter Support  - 1 x daily - 5 x weekly - 3 sets - 10 reps - Lunge with Counter Support  - 1 x daily - 5 x weekly - 3 sets - 10 reps - Tandem Stance with Support  - 1 x daily - 5 x weekly - 3 sets - 4 reps - 20sec  hold - Single Leg Stance with Support  - 1 x daily - 5 x weekly - 3 sets - 4 reps - 20 sec  hold - Backward Walking with Counter Support  - 1 x daily - 5 x weekly - 3 sets - 6 reps  GOALS: Goals reviewed with patient? Yes  SHORT  TERM GOALS: Target date: 09/10/2023  Patient will be independent in home exercise program to improve strength/mobility for better functional independence with ADLs and functional mobility.  Baseline: provided on 5/29  Goal status: In progress   LONG TERM GOALS: Target date: 10/22/2023  Patient (> 60 years old) will complete five times sit to stand test (5XSTS) in < 12 seconds indicating an increased LE strength and improved balance.  Baseline: 15.12 seconds with arms across chest 6/24: 11.9 sec no UE support  Goal status: MET  2.  Patient will increase Berg Balance score to > 51/56 to demonstrate improved balance and decreased fall risk during functional activities and ADLs.  Baseline: 38/56 6/24: 44/56 8/1: 53 Goal status: INITIAL  3.  Patient will increase six minute walk test ( ) distance to >1054ft for progression to community ambulator and improve gait ability  Baseline: 1,027 feet (313 meters, Avg speed 0.843m/s), no AD, with CGA and occasional min A 6/24: 1174ft no  Goal status: MET  4.  Patient will increase 10 meter walk test to >1.46m/s as to improve gait speed for better community ambulation and to reduce fall risk. Baseline: 0.93 m/s without AD with CGA  6/24: 0.94ms  8/1: 1.05 m/s  Goal status: MET  5.  Patient will increase Functional Gait Assessment (FGA) score to >20/30 as to reduce fall risk and improve dynamic gait safety with community ambulation.  Baseline:  8/1:20 Goal status: ONGOING  6.  Patient will improve Stroke Impact Scale-16 by >9 points indicating patient experiencing increased independence with functional mobility tasks and ADLs to improve quality of life and decrease caregiver burden.  Baseline: 35/80 6/24: 69/80 Goal status: MET  ASSESSMENT:  CLINICAL IMPRESSION:   Patient presents to physical therapy for progress note this date.  Patient shows excellent progress toward all of his long-term goals showing significantly reduced risk factor  for falls at this time.  Patient feels he is about 70% better and still does not feel comfortable returning to his previous level of activity including golfing and long walks around his  neighborhood.  Will continue to progress with higher-level dynamic balance activities as well as functional activities such as golfing and walking on unlevel surfaces and outdoor environments. Patient's condition has the potential to improve in response to therapy. Maximum improvement is yet to be obtained. The anticipated improvement is attainable and reasonable in a generally predictable time. Pt will continue to benefit from skilled physical therapy intervention to address impairments, improve QOL, and attain therapy goals.    OBJECTIVE IMPAIRMENTS: Abnormal gait, decreased activity tolerance, decreased balance, decreased coordination, decreased endurance, decreased knowledge of condition, decreased knowledge of use of DME, decreased mobility, difficulty walking, decreased strength, increased fascial restrictions, impaired flexibility, impaired sensation, impaired UE functional use, improper body mechanics, and pain.   ACTIVITY LIMITATIONS: carrying, lifting, bending, sitting, standing, squatting, stairs, transfers, bathing, toileting, dressing, reach over head, hygiene/grooming, and locomotion level  PARTICIPATION LIMITATIONS: meal prep, cleaning, laundry, interpersonal relationship, driving, shopping, community activity, and yard work  PERSONAL FACTORS: Age, Fitness, Time since onset of injury/illness/exacerbation, and 3+ comorbidities: Hyperlipidemia, BPH, HTN, hx of basal cell carcinoma, hx of squamous cell carcinoma, arthritis, R and L reverse shoulder arthroplasties (R in 2024 and L in 2017) are also affecting patient's functional outcome.   REHAB POTENTIAL: Excellent  CLINICAL DECISION MAKING: Evolving/moderate complexity  EVALUATION COMPLEXITY: Moderate  PLAN:  PT FREQUENCY: 1-2x/week  PT DURATION: 12  weeks  PLANNED INTERVENTIONS: 97164- PT Re-evaluation, 97750- Physical Performance Testing, 97110-Therapeutic exercises, 97530- Therapeutic activity, [id number]- Neuromuscular re-education, 97535- Self Care, [id number]- Manual therapy, [id number]- Gait training, [phone]- Orthotic Initial, [phone]- Orthotic/Prosthetic subsequent, [phone]- Canalith repositioning, [phone]- Electrical stimulation (manual), Patient/Family education, Balance training, Stair training, Taping, Dry Needling, Joint mobilization, Joint manipulation, Spinal mobilization, Vestibular training, Visual/preceptual remediation/compensation, DME instructions, Cryotherapy, Moist heat, and Biofeedback  PLAN FOR NEXT SESSION:  - participate in higher level dynamic balance and gait challenges including:  - horizontal and vertical head turns  - stepping towards external targets due to LLE incoordination  - gait in variable directions  - unstable surfaces - step-ups for B LE strengthening with simultaneous dynamic balance tasks - progress to practicing golf swing for return to recreational activities - Ascending and descending stairs without UE  - 1 leg balance progressive activities   Note: Portions of this document were prepared using Dragon voice recognition software and although reviewed may contain unintentional dictation errors in syntax, grammar, or spelling.  Lonni KATHEE Gainer PT ,DPT Physical Therapist- [place]  [facility]   9:55 AM 10/13/23

## 2023-10-13 NOTE — Therapy (Addendum)
 OUTPATIENT OCCUPATIONAL THERAPY NEURO EVALUATION  Patient Name: George Bruce MRN: [id number] DOB:October 17, 1945, 78 y.o., male Today's Date: 10/13/2023  PCP: Dr. Auston, MD REFERRING PROVIDER: Dr. 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   Hypertension    Wears dentures    partial upper and lower   Past Surgical History:  Procedure Laterality Date   CATARACT EXTRACTION W/PHACO Right 09/29/2022   Procedure: CATARACT EXTRACTION PHACO AND INTRAOCULAR LENS PLACEMENT (IOC) RIGHT  10.98  00:56.5;  Surgeon: Myrna Adine Anes, MD;  Location: [facility];  Service: Ophthalmology;  Laterality: Right;   CATARACT EXTRACTION W/PHACO Left 10/13/2022   Procedure: CATARACT EXTRACTION PHACO AND INTRAOCULAR LENS PLACEMENT (IOC) LEFT 10.79 00:54.1;  Surgeon: Myrna Adine Anes, MD;  Location: [facility];  Service: Ophthalmology;  Laterality: Left;   COLONOSCOPY WITH PROPOFOL  N/A 04/21/2018   Procedure: COLONOSCOPY WITH PROPOFOL ;  Surgeon: Janalyn Keene NOVAK, MD;  Location: [facility];  Service: Endoscopy;  Laterality: N/A;   INGUINAL HERNIA REPAIR Left 1990's   INGUINAL HERNIA REPAIR Right 2015   REVERSE SHOULDER ARTHROPLASTY Left 11/06/2015   Procedure: REVERSE SHOULDER ARTHROPLASTY;  Surgeon: Norleen JINNY Maltos, MD;  Location: [facility];  Service: Orthopedics;  Laterality: Left;   REVERSE SHOULDER ARTHROPLASTY  Right 03/25/2022   Procedure: REVERSE SHOULDER ARTHROPLASTY;  Surgeon: Maltos Norleen JINNY, MD;  Location: [facility];  Service: Orthopedics;  Laterality: Right;  MAKE 2ND CASE   Patient Active Problem List   Diagnosis Date Noted   Acute CVA (cerebrovascular accident) (HCC) 07/21/2023   Elevated blood pressure reading 07/21/2023   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   Acute diverticulitis 05/07/2018   History of colonic polyps    Benign neoplasm of ascending colon    Diverticulosis of large intestine without diverticulitis    Erectile dysfunction due to arterial insufficiency 02/18/2018   Degenerative disc disease, lumbar 08/26/2016   Bilateral hip pain 06/24/2016   Chronic midline low back pain 06/24/2016   Polyarthralgia 06/24/2016   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 reports that his balance has been improving with PT.  Pt accompanied by: self  PERTINENT HISTORY: Pt. was admitted to the  hospital from 5/13-5/14/25 with an Acute Ischemic Large Vessel RPCA Occlusion. PMHx includes: HTN, HLD, and BPH.  PRECAUTIONS: None, fall  WEIGHT BEARING RESTRICTIONS: No  PAIN:  Are you having pain? 6/10 low back pain  FALLS: Has patient fallen in last 6 months? No  LIVING ENVIRONMENT: Lives with: lives with their spouse Lives in: House/apartment-I level Stairs: 4 steps in to the house with  rail on the right Has following equipment at home: Single point cane and Walker - 2 wheeled  PLOF: Independent  PATIENT GOALS: To get back into yard work, and golfing  OBJECTIVE:  Note: Objective measures were completed at Evaluation unless otherwise noted.  HAND DOMINANCE: Left  ADLs:  Eating:  Difficulty with motor control-holding utensils with the Left hand Grooming: Difficulty with motor control holding the toothbrush with the Left hand. Starts out brushing his hair with the left hand then switches to the right hand. Shaves with the right hand. UB Dressing: Independent with pull over shirts. Difficulty buttoning LB Dressing:  Difficulty reaching around to place belt through the loops. Toileting: Independent Bathing: Independent  Tub Shower transfers:  uses bath mat   IADLs: Shopping: Independent reaching for items, Independent with the transaction efficient. Light housekeeping: cleans bathroom, wash dishes-does not put dishes away in cabinet making bed  Meal Prep:  Independent  light meal prep, cuts tomatoes Community mobility: Driving short distances Medication management: Wife lines them up. Difficulty grasping them with the left hand. Financial management: Is not able to write checks. Handwriting: 50% legible in cursive form. Work history: retired Midwife, yard work, visit with friends at [employer], and [employer]  MOBILITY STATUS: Modified Independent-balance issues.   ACTIVITY TOLERANCE: Activity tolerance:  Fair  FUNCTIONAL OUTCOME MEASURES: MAM-20 Sum score: 51/80  UPPER EXTREMITY ROM:    Active ROM Right eval Left eval  Shoulder flexion 132(150) 120(132)  Shoulder abduction 120(130) 110(116)  Shoulder adduction    Shoulder extension    Shoulder internal rotation    Shoulder external rotation    Elbow flexion [facility] WFL  Elbow extension [facility]  Wrist flexion [facility] WFL  Wrist extension [facility] WFL  Wrist ulnar deviation    Wrist  radial deviation    Wrist pronation WFL WFL  Wrist supination WFL WFL  (Blank rows = not tested)  UPPER EXTREMITY MMT:     MMT Right Eval WFL Left Eval 4-/5  Shoulder flexion    Shoulder abduction    Shoulder adduction    Shoulder extension    Shoulder internal rotation    Shoulder external rotation    Middle trapezius    Lower trapezius    Elbow flexion    Elbow extension    Wrist flexion    Wrist extension    Wrist ulnar deviation    Wrist radial deviation    Wrist pronation    Wrist supination    (Blank rows = not tested)  HAND FUNCTION: Grip strength: Right: 63 lbs; Left: 59 lbs, Lateral pinch: Right: 12 lbs, Left: 10 lbs, and 3 point pinch: Right: 12 lbs, Left: 8 lbs  COORDINATION: 9 Hole Peg test: Right: 31 sec; Left: 45 sec  SENSATION: Light touch: WFL Proprioception: WFL  EDEMA:  N/A  COGNITION: Overall cognitive status: Within functional limits for tasks assessed  VISION:  No change from baseline  PERCEPTION: WFL  PRAXIS: Impaired motor control  TREATMENT DATE: 10/13/23  OT initial evaluation was completed, and Pt. education was provided as indicated below.  PATIENT EDUCATION: Education details: OT services, POC, goals and ADL/IADL functional Status.  Person educated: Patient Education method: Explanation, Demonstration, Tactile cues, and Verbal cues Education comprehension: verbalized understanding, returned demonstration, verbal cues required, tactile cues required, and needs further education  HOME EXERCISE PROGRAM:  Continue to assess ongoing need for HEPs, and provide/upgrade as indicated.    GOALS: Goals reviewed with patient? YES  SHORT TERM GOALS: Target date: 11/24/2023   Pt. will be independent with HEPs for BUE strength, and [facility] skills. Baseline: Eval: No current HEP Goal status: INITIAL  LONG TERM GOALS: Target date: 01/05/2024  Pt. Will improve Left [facility] skills by  5 sec. of speed in order to be able to manipulate small  objects.  Baseline: Eval: Right: 31 sec. Left: 45 sec. Goal status: INITIAL  2.  Pt. Will increase Left UE strength by 2 mm grades to assist with ADLs, and IADLs. Baseline:Eval: Right: WFL; Left shoulder flexion: 4-/5, abduction: 4-/5, elbow flexion: 4-/5, elbow extension: 4-/5, wrist extension: 4-/5 Goal status: INITIAL  3. Pt. Will increase  Left grip strength by  5# to be able to hold ADL items securely in his hand  Baseline: Eval: Right: 63#, Left: 59# Goal status: INITIAL  4.  Pt. Will increase Left pinch strength by 3# to be able to efficiently cut food. Baseline: Eval: Lateral pinch: Right: 12 lbs, Left: 10 lbs, and 3 point pinch: Right: 12 lbs, Left: 8 lbs Goal status: INITIAL  5.  Pt. Will be  able to button a shirt efficiently with modified independence. Baseline: Eval: Pt. has difficulty manipulating buttons Goal status: INITIAL  6.  Pt. Will independently, and efficiently complete golf tournament score cards with 75% legibility. Baseline: Eval: 50% legibility for name only. Goal status: INITIAL  ASSESSMENT:  CLINICAL IMPRESSION:  Patient is a 78 y.o. male who was seen today for occupational therapy evaluation for CVA with dominant left sided weakness. Pt. presents with dominant LUE weakness, grip strength, pinch strength, impaired motor control, and impaired left [facility] skills which hinders his ability to hold utensils/toothbrush/ADL items securely in his left hand, manipulate small objects, button a shirt, cut food, and write legibly for filling out golf tournament score cards. Pt. will benefit from OT services to work on improving LUE strength, and left hand function skills in order to work towards improving, and maximizing overall independence with ADLs, and IADL tasks.   PERFORMANCE DEFICITS: in functional skills including ADLs, IADLs, coordination, dexterity, proprioception, sensation, ROM, strength, Fine motor control, Gross motor control, mobility, decreased knowledge  of precautions, decreased knowledge of use of DME, and UE functional use, cognitive skills including, and psychosocial skills including coping strategies, environmental adaptation, interpersonal interactions, and routines and behaviors.   IMPAIRMENTS: are limiting patient from ADLs, IADLs, and leisure.   CO-MORBIDITIES: may have co-morbidities  that affects occupational performance. Patient will benefit from skilled OT to address above impairments and improve overall function.  MODIFICATION OR ASSISTANCE TO COMPLETE EVALUATION: Min-Moderate modification of tasks or assist with assess necessary to complete an evaluation.  OT OCCUPATIONAL PROFILE AND HISTORY: Detailed assessment: Review of records and additional review of physical, cognitive, psychosocial history related to current functional performance.  CLINICAL DECISION MAKING: Moderate - several treatment options, min-mod task modification necessary  REHAB POTENTIAL: Good  EVALUATION COMPLEXITY: Moderate    PLAN:  OT FREQUENCY: 2x/week  OT DURATION: 12 weeks  PLANNED INTERVENTIONS: 97535 self care/ADL training, 02889 therapeutic exercise, 97530 therapeutic activity, 97112 neuromuscular re-education, 97140 manual therapy, [id number]  paraffin, 02989 moist heat, 97034 contrast bath, balance training, functional mobility training, energy conservation, coping strategies training, patient/family education, and DME and/or AE instructions  RECOMMENDED OTHER SERVICES: PT  CONSULTED AND AGREED WITH PLAN OF CARE: Patient  PLAN FOR NEXT SESSION: Treatment  Richardson Otter, MS, OTR/L  10/13/2023, 8:55 AM

## 2023-10-15 ENCOUNTER — Ambulatory Visit: Admitting: Physical Therapy

## 2023-10-15 ENCOUNTER — Ambulatory Visit: Admitting: Occupational Therapy

## 2023-10-15 DIAGNOSIS — R262 Difficulty in walking, not elsewhere classified: Secondary | ICD-10-CM

## 2023-10-15 DIAGNOSIS — M6281 Muscle weakness (generalized): Secondary | ICD-10-CM

## 2023-10-15 DIAGNOSIS — R2681 Unsteadiness on feet: Secondary | ICD-10-CM

## 2023-10-15 DIAGNOSIS — R278 Other lack of coordination: Secondary | ICD-10-CM

## 2023-10-15 DIAGNOSIS — R269 Unspecified abnormalities of gait and mobility: Secondary | ICD-10-CM

## 2023-10-15 NOTE — Therapy (Addendum)
 OUTPATIENT OCCUPATIONAL THERAPY NEURO TREATMENT NOTE  Patient Name: George Bruce MRN: [id number] DOB:05-18-1945, 78 y.o., male Today's Date: 10/15/2023  PCP: Dr. Auston, MD REFERRING PROVIDER: Dr. 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;  Service: Ophthalmology;  Laterality: Right;   CATARACT EXTRACTION W/PHACO Left 10/13/2022   Procedure: CATARACT EXTRACTION PHACO AND INTRAOCULAR LENS PLACEMENT (IOC) LEFT 10.79 00:54.1;  Surgeon: Myrna Adine Anes, MD;  Location: [facility];  Service: Ophthalmology;  Laterality: Left;   COLONOSCOPY WITH PROPOFOL  N/A 04/21/2018   Procedure: COLONOSCOPY WITH PROPOFOL ;  Surgeon: Janalyn Keene NOVAK, MD;  Location: [facility];  Service: Endoscopy;  Laterality: N/A;   INGUINAL HERNIA REPAIR Left 1990's   INGUINAL HERNIA REPAIR Right 2015   REVERSE SHOULDER ARTHROPLASTY Left 11/06/2015   Procedure: REVERSE SHOULDER ARTHROPLASTY;  Surgeon: Norleen JINNY Maltos, MD;  Location: [facility] OR[facility];  Service: Orthopedics;  Laterality: Left;   REVERSE SHOULDER ARTHROPLASTY  Right 03/25/2022   Procedure: REVERSE SHOULDER ARTHROPLASTY;  Surgeon: Maltos Norleen JINNY, MD;  Location: [facility] OR[facility];  Service: Orthopedics;  Laterality: Right;  MAKE 2ND CASE   Patient Active Problem List   Diagnosis Date Noted   Acute CVA (cerebrovascular accident) (HCC) 07/21/2023   Elevated blood pressure reading 07/21/2023   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   Acute diverticulitis 05/07/2018   History of colonic polyps    Benign neoplasm of ascending colon    Diverticulosis of large intestine without diverticulitis    Erectile dysfunction due to arterial insufficiency 02/18/2018   Degenerative disc disease, lumbar 08/26/2016   Bilateral hip pain 06/24/2016   Chronic midline low back pain 06/24/2016   Polyarthralgia 06/24/2016   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 Reports that he does not use the walker at home  Pt accompanied by: self  PERTINENT HISTORY: Pt. was admitted  to the hospital from 5/13-5/14/25 with an Acute Ischemic Large Vessel RPCA Occlusion. PMHx includes: HTN, HLD, and BPH.  PRECAUTIONS: None, fall  WEIGHT BEARING RESTRICTIONS: No  PAIN:  Are you having pain? 6/10 low back pain  FALLS: Has patient fallen in last 6 months? No  LIVING ENVIRONMENT: Lives with: lives with their spouse Lives in: House/apartment-I level Stairs: 4 steps in to the house with rail  on the right Has following equipment at home: Single point cane and Walker - 2 wheeled  PLOF: Independent  PATIENT GOALS: To get back into yard work, and golfing  OBJECTIVE:  Note: Objective measures were completed at Evaluation unless otherwise noted.  HAND DOMINANCE: Left  ADLs:  Eating:  Difficulty with motor control-holding utensils with the Left hand Grooming: Difficulty with motor control holding the toothbrush with the Left hand. Starts out brushing his hair with the left hand then switches to the right hand. Shaves with the right hand. UB Dressing: Independent with pull over shirts. Difficulty buttoning LB Dressing:  Difficulty reaching around to place belt through the loops. Toileting: Independent Bathing: Independent  Tub Shower transfers:  uses bath mat   IADLs: Shopping: Independent reaching for items, Independent with the transaction efficient. Light housekeeping: cleans bathroom, wash dishes-does not put dishes away in cabinet making bed  Meal Prep:  Independent  light meal prep, cuts tomatoes Community mobility: Driving short distances Medication management: Wife lines them up. Difficulty grasping them with the left hand. Financial management: Is not able to write checks. Handwriting: 50% legible in cursive form. Work history: retired Midwife, yard work, visit with friends at [employer], and [employer]  MOBILITY STATUS: Modified Independent-balance issues.   ACTIVITY TOLERANCE: Activity tolerance:  Fair  FUNCTIONAL OUTCOME MEASURES: MAM-20 Sum score: 51/80  UPPER EXTREMITY ROM:    Active ROM Right eval Left eval  Shoulder flexion 132(150) 120(132)  Shoulder abduction 120(130) 110(116)  Shoulder adduction    Shoulder extension    Shoulder internal rotation    Shoulder external rotation    Elbow flexion [facility] WFL  Elbow extension [facility]  Wrist flexion [facility] WFL  Wrist extension [facility] WFL  Wrist ulnar deviation    Wrist radial  deviation    Wrist pronation WFL WFL  Wrist supination WFL WFL  (Blank rows = not tested)  UPPER EXTREMITY MMT:     MMT Right Eval WFL Left Eval 4-/5  Shoulder flexion    Shoulder abduction    Shoulder adduction    Shoulder extension    Shoulder internal rotation    Shoulder external rotation    Middle trapezius    Lower trapezius    Elbow flexion    Elbow extension    Wrist flexion    Wrist extension    Wrist ulnar deviation    Wrist radial deviation    Wrist pronation    Wrist supination    (Blank rows = not tested)  HAND FUNCTION: Grip strength: Right: 63 lbs; Left: 59 lbs, Lateral pinch: Right: 12 lbs, Left: 10 lbs, and 3 point pinch: Right: 12 lbs, Left: 8 lbs  COORDINATION: 9 Hole Peg test: Right: 31 sec; Left: 45 sec  SENSATION: Light touch: WFL Proprioception: WFL  EDEMA:  N/A  COGNITION: Overall cognitive status: Within functional limits for tasks assessed  VISION:  No change from baseline  PERCEPTION: WFL  PRAXIS: Impaired motor control  TREATMENT DATE: 10/15/23  Therapeutic Ex.:   -The patient completed 5 minutes at level  4 adjusted to level 2 on the SciFit using bilateral upper extremity reciprocal movements (clockwise) to promote upper extremity strength, endurance, and ROM, as well as to improve cardiorespiratory fitness. The therapist increased the resistance level and continuously monitored the patient's response to the intervention. The patient required min cueing for technique and CGA  to transfer on and off the SciFit. -Pt. Performed left gross grip strength, as well as lateral, and 3pt. Pinch strengthening with green level resistive theraputty. A HEP from provided to the Pt.  Therapeutic Activities:   -Facilitated LUE hand  progressive gross grip strengthening using a handheld vertical gripper with 17.9 # of force to remove large pegs positioned vertically on a pegboard.  -Incorporated a reaching component through multiple planes in  combination with the task to further challenge sustained gross grip, and distal motor control while moving the proximal UE.  -facilitated left Lateral, and 3pt. Pinch strengthening using yellow, red, green, and blue, and black level resistive clips with visual and verbal cues required for form, and technique. -facilitated functional reaching combined with sustained pinch while reaching out to place the clips onto a horizontal dowel.  Neuromuscular re-education:   -facilitated left FMC tasks using the Grooved pegboard, grasping and 1 grooved pegs from a horizontal position in the shallow dish, and transitioning them to a vertical position in preparation for placing them upright into the pegboard.  -attempted translatory movements with the 1 grooved pegs, however presented with difficulty, and the task was ultimately modified to storing, and moving one 1/2 flat [place] marble through his hand, then progressing to two marbles.    PATIENT EDUCATION: Education details: UE functioning , FMC Person educated: Patient Education method: Explanation, Demonstration, Tactile cues, and Verbal cues Education comprehension: verbalized understanding, returned demonstration, verbal cues required, tactile cues required, and needs further education  HOME EXERCISE PROGRAM:    Left gross grip strength, as well as lateral, and 3pt. Pinch strengthening with green level resistive theraputty   GOALS: Goals reviewed with patient? YES  SHORT TERM GOALS: Target date: 11/24/2023   Pt. will be independent with HEPs for BUE strength, and [facility] skills. Baseline: Eval: No current HEP Goal status: INITIAL  LONG TERM GOALS: Target date: 01/05/2024  Pt. Will improve Left [facility] skills by  5 sec. of speed in order to be able to manipulate small objects.  Baseline: Eval: Right: 31 sec. Left: 45 sec. Goal status: INITIAL  2.  Pt. Will increase Left UE strength by 2 mm grades to assist with ADLs, and IADLs. Baseline:Eval:   Right: WFL; Left shoulder flexion: 4-/5, abduction: 4-/5, elbow flexion: 4-/5, elbow extension: 4-/5, wrist extension: 4-/5 Goal status: INITIAL  3. Pt. Will increase  Left grip strength by  5# to be able to hold ADL items securely in his hand  Baseline: Eval: Right: 63#, Left: 59# Goal status: INITIAL  4.  Pt. Will increase Left pinch strength by 3# to be able to efficiently cut food. Baseline: Eval: Lateral pinch: Right: 12 lbs, Left: 10 lbs, and 3 point pinch: Right: 12 lbs, Left: 8 lbs Goal status: INITIAL  5.  Pt. Will be  able to button a shirt efficiently with modified independence. Baseline: Eval: Pt. has difficulty manipulating buttons Goal status: INITIAL  6.  Pt. Will independently, and efficiently complete golf tournament score cards with 75% legibility. Baseline: Eval: 50% legibility for name only. Goal status: INITIAL  ASSESSMENT:  CLINICAL IMPRESSION:  Pt. Reports that he does not use his cane around the  home, and uses it only for longer distances. Pt. Continues to present with decreased motor control due to ataxia. Pt. Was able to tolerate 5 min. On the SCIFIt, and required the work level to be adjusted from 4.0 to 2.0. Pt. Required consistent cues for hand placement, and pinch position on the clips. Pt. Was able to grasp the 1 grooved pegs, and place them into the grooved pegboard, however had difficulty with translatory movements moving them through the hand. Pt. Was able to store, and move 1 to 2 half inch flat [place] marbles through his hand. Pt. will benefit from OT services to work on improving LUE strength, and left hand function skills in order to work towards improving, and maximizing overall independence with ADLs, and IADL tasks.   PERFORMANCE DEFICITS: in functional skills including ADLs, IADLs, coordination, dexterity, proprioception, sensation, ROM, strength, Fine motor control, Gross motor control, mobility, decreased knowledge of precautions, decreased  knowledge of use of DME, and UE functional use, cognitive skills including, and psychosocial skills including coping strategies, environmental adaptation, interpersonal interactions, and routines and behaviors.   IMPAIRMENTS: are limiting patient from ADLs, IADLs, and leisure.   CO-MORBIDITIES: may have co-morbidities  that affects occupational performance. Patient will benefit from skilled OT to address above impairments and improve overall function.  MODIFICATION OR ASSISTANCE TO COMPLETE EVALUATION: Min-Moderate modification of tasks or assist with assess necessary to complete an evaluation.  OT OCCUPATIONAL PROFILE AND HISTORY: Detailed assessment: Review of records and additional review of physical, cognitive, psychosocial history related to current functional performance.  CLINICAL DECISION MAKING: Moderate - several treatment options, min-mod task modification necessary  REHAB POTENTIAL: Good  EVALUATION COMPLEXITY: Moderate    PLAN:  OT FREQUENCY: 2x/week  OT DURATION: 12 weeks  PLANNED INTERVENTIONS: 97535 self care/ADL training, 02889 therapeutic exercise, 97530 therapeutic activity, 97112 neuromuscular re-education, 97140 manual therapy, 97018 paraffin, 02989 moist heat, 97034 contrast bath, balance training, functional mobility training, energy conservation, coping strategies training, patient/family education, and DME and/or AE instructions  RECOMMENDED OTHER SERVICES: PT  CONSULTED AND AGREED WITH PLAN OF CARE: Patient  PLAN FOR NEXT SESSION: Treatment  Katelyn Broadnax, MS, OTR/L  10/15/2023, 10:39 PM

## 2023-10-15 NOTE — Therapy (Signed)
 OUTPATIENT PHYSICAL THERAPY NEURO TREATMENT     Patient Name: George Bruce MRN: [id number] DOB:[birth year], 78 y.o., male Today's Date: 10/15/2023   PCP: Auston Reyes BIRCH, MD  REFERRING PROVIDER: Auston Reyes BIRCH, MD   END OF SESSION:   PT End of Session - 10/15/23 1320     Visit Number 22    Number of Visits 24    Date for PT Re-Evaluation 10/22/23    Progress Note Due on Visit 30    PT Start Time 1317    PT Stop Time 1357    PT Time Calculation (min) 40 min    Equipment Utilized During Treatment Gait belt    Activity Tolerance Patient tolerated treatment well    Behavior During Therapy WFL for tasks assessed/performed            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   Hypertension    Wears dentures    partial upper and lower   Past Surgical History:  Procedure Laterality Date   CATARACT EXTRACTION W/PHACO Right 09/29/2022   Procedure: CATARACT EXTRACTION PHACO AND INTRAOCULAR LENS PLACEMENT (IOC) RIGHT  10.98  00:56.5;  Surgeon: Myrna Adine Anes, MD;  Location: [facility];  Service: Ophthalmology;  Laterality: Right;   CATARACT EXTRACTION W/PHACO Left 10/13/2022   Procedure: CATARACT EXTRACTION PHACO AND INTRAOCULAR LENS PLACEMENT (IOC) LEFT 10.79 00:54.1;  Surgeon: Myrna Adine Anes, MD;  Location: [facility];  Service: Ophthalmology;  Laterality: Left;   COLONOSCOPY WITH PROPOFOL  N/A 04/21/2018   Procedure: COLONOSCOPY WITH PROPOFOL ;  Surgeon: Janalyn Keene NOVAK, MD;  Location: [facility];  Service: Endoscopy;  Laterality: N/A;   INGUINAL HERNIA REPAIR Left 1990's   INGUINAL HERNIA REPAIR Right 2015   REVERSE SHOULDER ARTHROPLASTY Left 11/06/2015   Procedure: REVERSE SHOULDER ARTHROPLASTY;  Surgeon: Norleen JINNY Maltos, MD;  Location: [facility];  Service: Orthopedics;  Laterality: Left;   REVERSE SHOULDER ARTHROPLASTY Right 03/25/2022   Procedure: REVERSE SHOULDER ARTHROPLASTY;  Surgeon: Maltos Norleen JINNY, MD;  Location: [facility];  Service: Orthopedics;  Laterality: Right;  MAKE 2ND CASE   Patient Active Problem List   Diagnosis Date Noted   Acute CVA (cerebrovascular accident) (HCC) 07/21/2023   Elevated blood pressure reading 07/21/2023   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   Acute diverticulitis 05/07/2018   History of colonic polyps    Benign neoplasm of ascending colon    Diverticulosis of large intestine without diverticulitis    Erectile dysfunction due to arterial insufficiency 02/18/2018   Degenerative disc disease, lumbar 08/26/2016   Bilateral hip pain 06/24/2016   Chronic midline low back pain 06/24/2016   Polyarthralgia 06/24/2016   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  Rationale for Evaluation and Treatment: Rehabilitation  SUBJECTIVE:                                                                                                                                                                                             SUBJECTIVE STATEMENT:  Pt reports doing well today. Pt denies any recent falls/stumbles since prior session. Pt denies any updates to medications  or medical appointment since prior session. Pt reports good compliance with HEP when time permits. Brings in personal left hand golf club for practice   Accompanied by self today  From Initial Eval: Pt states he has hx of sciatica in L LE. Pt states recent CVA affected his L side and he is having numbness in L UE and LE. Pt states the CVA affected him from his jaw down through his L arm and L LE. Pt states he is left hand dominant. Pt states he was walking ~39miles daily and performing all household chores prior to CVA. Patient states he was very active and completely independent prior.   Pt states he had MRI yesterday for L LE sciatica and is going to see MD tomorrow to follow-up on results (with [facility]). Patient reporting current pain as 6/10 with it being greater this morning, but he took pain medication to ease it off prior to coming to therapy.   Pt accompanied by: significant other, Wife, [name]  PERTINENT HISTORY: PMH of Hyperlipidemia, BPH, HTN, hx of basal cell carcinoma, hx of squamous cell carcinoma, arthritis, R and L reverse shoulder arthroplasties (R in 2024 and L in 2017)  PAIN:  Are you having pain? Yes: NPRS scale: 6/10 after taking pain medication this AM Pain location: L LE sciatica Pain description: severe ache Aggravating factors: he can wake up with it, no specific aggravating factors Relieving factors: pt reports having to take pain medication (hydrocodone ) this AM, hx of having injections and prednisone ; uses horse liniment   PRECAUTIONS: Fall  RED FLAGS: None   WEIGHT BEARING RESTRICTIONS: No  FALLS: Has patient fallen in last 6 months? No and but states had a few stumbles getting his feet tangled up  LIVING ENVIRONMENT: Lives with: lives with their spouse Lives in: House/apartment Stairs: Yes: External: 3+1 steps; on right going up; 1 level house Has following equipment at home: Walker - 2 wheeled and built-in shower seat  PLOF: Independent, Independent  with household mobility without device, Independent with homemaking with ambulation, Independent with gait, and Leisure: golf  PATIENT GOALS: get back to half normal being able to do yard  work, wash a car, and golf  OBJECTIVE:  Note: Objective measures were completed at Evaluation unless otherwise noted.  DIAGNOSTIC FINDINGS:  EXAM: MRI HEAD WITHOUT CONTRAST   TECHNIQUE: Multiplanar, multiecho pulse sequences of the brain and surrounding structures were obtained without intravenous contrast.   COMPARISON:  CT head, CTA, CTP yesterday.  Brain MRI 02/22/2008.  IMPRESSION: 1. Positive for Acute lacunar infarct of the lateral Right Thalamus, but also abnormal diffusion throughout the right hippocampal formation. Both are Right PCA territory, although superimposed seizure related changes of the right hippocampus would be difficult to exclude. No associated hemorrhage or mass effect.   2. 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 No other acute intracranial abnormality identified.   Electronically Signed   By: VEAR Hurst M.D.   On: 07/21/2023 05:51  CTA head:   1. 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 There is some reconstitution of enhancement within right posterior cerebral artery branches beginning at the distal P2 segment level. 2. 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 3. Atherosclerotic plaque within the intracranial internal carotid arteries with no more than mild stenosis.     Electronically Signed   By: Rockey Childs D.O.   On: 07/20/2023 19:32   COGNITION: Overall cognitive status: Within functional limits for tasks assessed   SENSATION: Light touch: Impaired  and basically  absent Proprioception: Impaired  Can only feel deep pressure in L LE  COORDINATION: Incoordinated in L LE, likely due to sensory impairments  EDEMA:  Not formally assessed  MUSCLE TONE: Not formally assessed  MUSCLE LENGTH: Not formally assessed  DTRs:  Not formally assessed  POSTURE: rounded shoulders and forward head  LOWER EXTREMITY ROM:     Active  Right Eval  WFL/WNL throughout Left Eval  WFL/WNL throughout  Hip flexion    Hip extension    Hip abduction    Hip adduction    Hip internal rotation    Hip external rotation    Knee flexion    Knee extension    Ankle dorsiflexion    Ankle plantarflexion    Ankle inversion    Ankle eversion     (Blank rows = not tested)  LOWER EXTREMITY MMT:    MMT Right Eval Left Eval  Hip flexion 4+ 3+  Hip extension    Hip abduction    Hip adduction    Hip internal rotation    Hip external rotation    Knee flexion 4+ 4-  Knee extension 4+ 4+  Ankle dorsiflexion 4+ 3  Ankle plantarflexion 4+ 4-  Ankle inversion    Ankle eversion    (Blank rows = not tested)  Manual Muscle Test Scale 0/5 = No muscle contraction can be seen or felt 1/5 = Contraction can be felt, but there is no motion 2-/5 = Part moves through incomplete ROM w/ gravity decreased 2/5 = Part moves through complete ROM w/ gravity decreased 2+/5 = Part moves through incomplete ROM (<50%) against gravity or through complete ROM w/ gravity 3-/5 = Part moves through incomplete ROM (>50%) against gravity 3/5 = Part moves through complete ROM against gravity 3+/5 = Part moves through complete ROM against gravity/slight resistance 4-/5= Holds test position against slight to moderate  pressure 4/5 = Part moves through complete ROM against gravity/moderate resistance 4+/5= Holds test position against moderate to strong pressure 5/5 = Part moves through complete ROM against gravity/full resistance  BED MOBILITY:  Not tested, pt denies difficulty with  this  TRANSFERS: Sit to stand: SBA and CGA  Assistive device utilized: Environmental consultant - 2 wheeled and None     Stand to sit: SBA and CGA  Assistive device utilized: Environmental consultant - 2 wheeled and None     Chair to chair: SBA and CGA  Assistive device utilized: Environmental consultant - 2 wheeled and None      *requires CGA when not using AD* RAMP:  Not tested  CURB:  Not tested  STAIRS: Findings: Level of Assistance: CGA and Min A, Stair Negotiation Technique: Step to Pattern Forwards with Single Rail on Right, Number of Stairs: 4, Height of Stairs: 6in   , and Comments: incoordinated, but no greater than min A for balance; only used R HR to simulate home environment GAIT: Findings: Gait Characteristics: step through pattern, decreased arm swing- Right, decreased arm swing- Left, decreased step length- Right, decreased step length- Left, decreased stride length, and wide BOS, Distance walked: 4ft + 377ft, Assistive device utilized:None, Level of assistance: CGA and Min A, and Comments: pt uses RW for all ambulation at home, but didn't use AD during session to assess patient's gait without  FUNCTIONAL TESTS:  5 times sit to stand: 15.12 seconds with arms across chest  6 minute walk test: need to assess 10 meter walk test: 0.93 m/s without AD with CGA for safety  Berg Balance Scale: need to assess Functional gait assessment: need to assess   PATIENT SURVEYS:  Stroke Impact Scale 35/80                                                                                                                              TREATMENT DATE: 10/15/2023  TA- To improve functional movements patterns for everyday tasks   The patient completed 6 minutes at level(s) 3 on the NuStep using both BUE/BLE reciprocal movements to promote strength, endurance, and cardiorespiratory fitness. PT monitored the resistance level and monitored the patient's response to the intervention throughout.   Step up x 10 ea LE  Participate in higher level  dynamic balance and gait challenges including:  NMR: To facilitate reeducation of movement, balance, posture, coordination, and/or proprioception/kinesthetic sense.  The patient participated in the PopDot game on the [facility] platform for 8 minutes at stability level 4. This interactive game requires the patient to quickly shift their weight and balance to "pop" virtual dots appearing in various locations on the screen. The activity challenges dynamic balance, reaction time, coordination, and weight shifting in a fun, engaging environment.   The [employer] game on the KorebalanceT System is a therapeutic, interactive balance training exercise that integrates visual tracking, motor coordination, and postural control.  The [employer] game on the Orange system engages patients  in dynamic balance training using an interactive platform that responds to weight shifts and body movements. In this game, patients control a Geneticist, molecular (Tux) racing downhill by leaning or shifting their weight on the [employer]. The goal is to collect fish and avoid obstacles, which requires real-time postural adjustments, coordination, and visual-motor integration. Patient requires UE support and performed intervention for 10 minutes at stability level 4.    The KoreBalance System uses interactive technology to assess and train balance and neurosensory function. It promotes stability, vestibular cue utilization, coordination, and posture, and can incorporate cognitive training. Patients stand on an adjustable unstable platform. Elements such as activity duration, use of upper extremity support, foot position, and vision (e.g., eyes open or closed) can be modified to adjust difficulty.    Patient completes blaze pod activity while standing on Airex pad.  Patient has 6 blaze pods placed with 3 on ea side.  Patient completes 1 minute of dual task motor and cognitive by tapping foot laterally and anteriorly as well  as having to name a college/ sports team that correlates with the color that the blaze pod turns to randomly. Requires cues for proper placement of lower extremity to prevent loss of balance.   PATIENT EDUCATION: Education details:Pt educated throughout session about proper posture and technique with exercises. Improved exercise technique, movement at target joints, use of target muscles after min to mod verbal, visual, tactile cues.  S/s expected with PCA CVA.   Person educated: Patient Education method: Explanation Education comprehension: verbalized understanding and needs further education  HOME EXERCISE PROGRAM:  Access Code: [email or web address] URL: [email or web address] Date: 10/06/2023 Prepared by: Massie Dollar  Exercises - Standing March with Unilateral Counter Support  - 1 x daily - 5 x weekly - 2 sets - 10 reps - Sit to Stand Without Arm Support  - 1 x daily - 5 x weekly - 2 sets - 10 reps - Side Stepping with Counter Support  - 1 x daily - 5 x weekly - 3 sets - 10 reps - Lunge with Counter Support  - 1 x daily - 5 x weekly - 3 sets - 10 reps - Tandem Stance with Support  - 1 x daily - 5 x weekly - 3 sets - 4 reps - 20sec  hold - Single Leg Stance with Support  - 1 x daily - 5 x weekly - 3 sets - 4 reps - 20 sec  hold - Backward Walking with Counter Support  - 1 x daily - 5 x weekly - 3 sets - 6 reps  GOALS: Goals reviewed with patient? Yes  SHORT TERM GOALS: Target date: 09/10/2023  Patient will be independent in home exercise program to improve strength/mobility for better functional independence with ADLs and functional mobility.  Baseline: provided on 5/29  Goal status: In progress   LONG TERM GOALS: Target date: 10/22/2023  Patient (> 38 years old) will complete five times sit to stand test (5XSTS) in < 12 seconds indicating an increased LE strength and improved balance.  Baseline: 15.12 seconds with arms across chest 6/24: 11.9 sec no UE support  Goal  status: MET  2.  Patient will increase Berg Balance score to > 51/56 to demonstrate improved balance and decreased fall risk during functional activities and ADLs.  Baseline: 38/56 6/24: 44/56 8/1: 53 Goal status: INITIAL  3.  Patient will increase six minute walk test ( ) distance to >1045ft for progression to community ambulator and improve gait ability  Baseline:  1,027 feet (313 meters, Avg speed 0.867m/s), no AD, with CGA and occasional min A 6/24: 1172ft no  Goal status: MET  4.  Patient will increase 10 meter walk test to >1.51m/s as to improve gait speed for better community ambulation and to reduce fall risk. Baseline: 0.93 m/s without AD with CGA  6/24: 0.94ms  8/1: 1.05 m/s  Goal status: MET  5.  Patient will increase Functional Gait Assessment (FGA) score to >20/30 as to reduce fall risk and improve dynamic gait safety with community ambulation.  Baseline:  8/1:20 Goal status: ONGOING  6.  Patient will improve Stroke Impact Scale-16 by >9 points indicating patient experiencing increased independence with functional mobility tasks and ADLs to improve quality of life and decrease caregiver burden.  Baseline: 35/80 6/24: 69/80 Goal status: MET  ASSESSMENT:  CLINICAL IMPRESSION:   Patient arrived with good motivation for completion of pt activities.  Pt challenged with functional strength and balance activities this date. Weather outside not conducive to practicing golf swing on various surfaces but will challenge this in future sessions. Pt will continue to benefit from skilled physical therapy intervention to address impairments, improve QOL, and attain therapy goals.     OBJECTIVE IMPAIRMENTS: Abnormal gait, decreased activity tolerance, decreased balance, decreased coordination, decreased endurance, decreased knowledge of condition, decreased knowledge of use of DME, decreased mobility, difficulty walking, decreased strength, increased fascial restrictions, impaired  flexibility, impaired sensation, impaired UE functional use, improper body mechanics, and pain.   ACTIVITY LIMITATIONS: carrying, lifting, bending, sitting, standing, squatting, stairs, transfers, bathing, toileting, dressing, reach over head, hygiene/grooming, and locomotion level  PARTICIPATION LIMITATIONS: meal prep, cleaning, laundry, interpersonal relationship, driving, shopping, community activity, and yard work  PERSONAL FACTORS: Age, Fitness, Time since onset of injury/illness/exacerbation, and 3+ comorbidities: Hyperlipidemia, BPH, HTN, hx of basal cell carcinoma, hx of squamous cell carcinoma, arthritis, R and L reverse shoulder arthroplasties (R in 2024 and L in 2017) are also affecting patient's functional outcome.   REHAB POTENTIAL: Excellent  CLINICAL DECISION MAKING: Evolving/moderate complexity  EVALUATION COMPLEXITY: Moderate  PLAN:  PT FREQUENCY: 1-2x/week  PT DURATION: 12 weeks  PLANNED INTERVENTIONS: 97164- PT Re-evaluation, 97750- Physical Performance Testing, 97110-Therapeutic exercises, 97530- Therapeutic activity, [id number]- Neuromuscular re-education, 97535- Self Care, [id number]- Manual therapy, [id number]- Gait training, [phone]- Orthotic Initial, [phone]- Orthotic/Prosthetic subsequent, [phone]- Canalith repositioning, [phone]- Electrical stimulation (manual), Patient/Family education, Balance training, Stair training, Taping, Dry Needling, Joint mobilization, Joint manipulation, Spinal mobilization, Vestibular training, Visual/preceptual remediation/compensation, DME instructions, Cryotherapy, Moist heat, and Biofeedback  PLAN FOR NEXT SESSION:  - participate in higher level dynamic balance and gait challenges including:  - horizontal and vertical head turns  - stepping towards external targets due to LLE incoordination  - gait in variable directions  - unstable surfaces - step-ups for B LE strengthening with simultaneous dynamic balance tasks - progress to practicing golf swing  for return to recreational activities - Ascending and descending stairs without UE  - 1 leg balance progressive activities   Note: Portions of this document were prepared using Dragon voice recognition software and although reviewed may contain unintentional dictation errors in syntax, grammar, or spelling.  Lonni KATHEE Gainer PT ,DPT Physical Therapist- [place]  [facility]   1:22 PM 10/15/23

## 2023-10-19 NOTE — Therapy (Signed)
 OUTPATIENT OCCUPATIONAL THERAPY NEURO TREATMENT NOTE  Patient Name: George Bruce MRN: [id number] DOB:Feb 15, 1946, 78 y.o., male Today's Date: 10/20/2023  PCP: Dr. Auston, MD REFERRING PROVIDER: Dr. 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   Hypertension    Wears dentures    partial upper and lower   Past Surgical History:  Procedure Laterality Date   CATARACT EXTRACTION W/PHACO Right 09/29/2022   Procedure: CATARACT EXTRACTION PHACO AND INTRAOCULAR LENS PLACEMENT (IOC) RIGHT  10.98  00:56.5;  Surgeon: Myrna Adine Anes, MD;  Location: [facility];  Service: Ophthalmology;  Laterality: Right;   CATARACT EXTRACTION W/PHACO Left 10/13/2022   Procedure: CATARACT EXTRACTION PHACO AND INTRAOCULAR LENS PLACEMENT (IOC) LEFT 10.79 00:54.1;  Surgeon: Myrna Adine Anes, MD;  Location: [facility];  Service: Ophthalmology;  Laterality: Left;   COLONOSCOPY WITH PROPOFOL  N/A 04/21/2018   Procedure: COLONOSCOPY WITH PROPOFOL ;  Surgeon: Janalyn Keene NOVAK, MD;  Location: [facility];  Service: Endoscopy;  Laterality: N/A;   INGUINAL HERNIA REPAIR Left 1990's   INGUINAL HERNIA REPAIR Right 2015   REVERSE SHOULDER ARTHROPLASTY Left 11/06/2015   Procedure: REVERSE SHOULDER ARTHROPLASTY;  Surgeon: Norleen JINNY Maltos, MD;  Location: [facility] OR[facility];  Service: Orthopedics;  Laterality: Left;   REVERSE SHOULDER  ARTHROPLASTY Right 03/25/2022   Procedure: REVERSE SHOULDER ARTHROPLASTY;  Surgeon: Maltos Norleen JINNY, MD;  Location: [facility] OR[facility];  Service: Orthopedics;  Laterality: Right;  MAKE 2ND CASE   Patient Active Problem List   Diagnosis Date Noted   Acute CVA (cerebrovascular accident) (HCC) 07/21/2023   Elevated blood pressure reading 07/21/2023   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   Acute diverticulitis 05/07/2018   History of colonic polyps    Benign neoplasm of ascending colon    Diverticulosis of large intestine without diverticulitis    Erectile dysfunction due to arterial insufficiency 02/18/2018   Degenerative disc disease, lumbar 08/26/2016   Bilateral hip pain 06/24/2016   Chronic midline low back pain 06/24/2016   Polyarthralgia 06/24/2016   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 Reports he has been using his green theraputty almost daily.   Pt accompanied by: self  PERTINENT HISTORY: Pt.  was admitted to the hospital from 5/13-5/14/25 with an Acute Ischemic Large Vessel RPCA Occlusion. PMHx includes: HTN, HLD, and BPH.  PRECAUTIONS: None, fall  WEIGHT BEARING RESTRICTIONS: No  PAIN:  Are you having pain? no  FALLS: Has patient fallen in last 6 months? No  LIVING ENVIRONMENT: Lives with: lives with their spouse Lives in: House/apartment-I level Stairs: 4 steps in to the house  with rail on the right Has following equipment at home: Single point cane and Walker - 2 wheeled  PLOF: Independent  PATIENT GOALS: To get back into yard work, and golfing  OBJECTIVE:  Note: Objective measures were completed at Evaluation unless otherwise noted.  HAND DOMINANCE: Left  ADLs:  Eating:  Difficulty with motor control-holding utensils with the Left hand Grooming: Difficulty with motor control holding the toothbrush with the Left hand. Starts out brushing his hair with the left hand then switches to the right hand. Shaves with the right hand. UB Dressing: Independent with pull over shirts. Difficulty buttoning LB Dressing:  Difficulty reaching around to place belt through the loops. Toileting: Independent Bathing: Independent  Tub Shower transfers:  uses bath mat   IADLs: Shopping: Independent reaching for items, Independent with the transaction efficient. Light housekeeping: cleans bathroom, wash dishes-does not put dishes away in cabinet making bed  Meal Prep:  Independent  light meal prep, cuts tomatoes Community mobility: Driving short distances Medication management: Wife lines them up. Difficulty grasping them with the left hand. Financial management: Is not able to write checks. Handwriting: 50% legible in cursive form. Work history: retired Midwife, yard work, visit with friends at [employer], and [employer]  MOBILITY STATUS: Modified Independent-balance issues.   ACTIVITY TOLERANCE: Activity tolerance:  Fair  FUNCTIONAL OUTCOME MEASURES: MAM-20 Sum score: 51/80  UPPER EXTREMITY ROM:    Active ROM Right eval Left eval  Shoulder flexion 132(150) 120(132)  Shoulder abduction 120(130) 110(116)  Shoulder adduction    Shoulder extension    Shoulder internal rotation    Shoulder external rotation    Elbow flexion [facility] WFL  Elbow extension [facility] WFL  Wrist flexion [facility] WFL  Wrist extension [facility] WFL  Wrist ulnar deviation    Wrist  radial deviation    Wrist pronation WFL WFL  Wrist supination WFL WFL  (Blank rows = not tested)  UPPER EXTREMITY MMT:     MMT Right eval Left eval  Shoulder flexion    Shoulder abduction    Shoulder adduction    Shoulder extension    Shoulder internal rotation    Shoulder external rotation    Middle trapezius    Lower trapezius    Elbow flexion    Elbow extension    Wrist flexion    Wrist extension    Wrist ulnar deviation    Wrist radial deviation    Wrist pronation    Wrist supination    (Blank rows = not tested)  HAND FUNCTION: Grip strength: Right: 63 lbs; Left: 59 lbs, Lateral pinch: Right: 12 lbs, Left: 10 lbs, and 3 point pinch: Right: 12 lbs, Left: 8 lbs  COORDINATION: 9 Hole Peg test: Right: 31 sec; Left: 45 sec  SENSATION: Light touch: WFL Proprioception: WFL  EDEMA:  N/A  COGNITION: Overall cognitive status: Within functional limits for tasks assessed  VISION:  No change from baseline  PERCEPTION: WFL  PRAXIS: Impaired motor control  TREATMENT DATE: 10/20/23  Therapeutic Ex.:  -rubberband exercises focusing on gross digit extension/abdution -Facilitated LUE hand  progressive gross grip strengthening using a handheld vertical gripper with 17.9 # of force to place  large pegs onto pegboard and remove to place in tupperware held at shoulder height. 2nd trial gripper set to 21.2#.  -reviewed green theraputty HEP and added isolated digit flexion, digit adduction, and digit abduction, and removing small beads from putty.   Therapeutic Activities:  -Utilized MMDT to facilitated [facility] flipping discs, focus on incorporating proper wrist movements. Cues to minimize compensatory L shoulder hiking. Worked on progressively increasing the speed while maintaining control. Pt flips 20 discs in 58, 56, and 58 across 3 trials.   Neuromuscular re-education:  - Pt worked on grasping washers of various sizes from magnetic bowl and threaded onto vertical dowel using  L hand to grasp and remove from bowl. Pt held 4-6 washers and focused on translatory movements to drop washers one at a time back into bowl. Required repeated cues to maintain hand in supination to focus on thumb movements - increased dropping from ulnar side of palm in supination.      PATIENT EDUCATION: Education details: UE functioning , [facility] Person educated: Patient Education method: Explanation, Demonstration, Tactile cues, and Verbal cues Education comprehension: verbalized understanding, returned demonstration, verbal cues required, tactile cues required, and needs further education  HOME EXERCISE PROGRAM:    Left gross grip strength, as well as lateral, and 3pt. Pinch strengthening with green level resistive theraputty   GOALS: Goals reviewed with patient? YES  SHORT TERM GOALS: Target date: 11/24/2023   Pt. will be independent with HEPs for BUE strength, and [facility] skills. Baseline: Eval: No current HEP Goal status: INITIAL  LONG TERM GOALS: Target date: 01/05/2024  Pt. Will improve Left [facility] skills by  5 sec. of speed in order to be able to manipulate small objects.  Baseline: Eval: Right: 31 sec. Left: 45 sec. Goal status: INITIAL  2.  Pt. Will increase Left UE strength by 2 mm grades to assist with ADLs, and IADLs. Baseline:Eval:  Right: shoulder flexion: 4-/5, abduction: 4-/5, elbow flexion: 4-/5, elbow extension: 4-/5, wrist extension: 4-/5 Goal status: INITIAL  3. Pt. Will increase  Left grip strength by  5# to be able to hold ADL items securely in his hand  Baseline: Eval: Right: 63#, Left: 59# Goal status: INITIAL  4.  Pt. Will increase Left pinch strength by 3# to be able to efficiently cut food. Baseline: Eval: Lateral pinch: Right: 12 lbs, Left: 10 lbs, and 3 point pinch: Right: 12 lbs, Left: 8 lbs Goal status: INITIAL  5.  Pt. Will be  able to button a shirt efficiently with modified independence. Baseline: Eval: Pt. has difficulty manipulating buttons Goal  status: INITIAL  6.  Pt. Will independently, and efficiently complete golf tournament score cards with 75% legibility. Baseline: Eval: 50% legibility for name only. Goal status: INITIAL  ASSESSMENT:  CLINICAL IMPRESSION:  Pt. Continues to present with decreased motor control due to ataxia. Utilized MMDT discs to focus on in hand manipulation of objects - flips 20 discs in 58, 56, and 58 across 3 trials. Tolerated increased gripper force 21.2#. Cues throughout to minimize L shoulder hiking. Pt. will benefit from OT services to work on improving LUE strength, and left hand function skills in order to work towards improving, and maximizing overall independence with ADLs, and IADL tasks.   PERFORMANCE DEFICITS: in functional skills including ADLs, IADLs, coordination, dexterity, proprioception, sensation, ROM, strength, Fine motor control, Gross motor control, mobility, decreased knowledge of precautions, decreased knowledge  of use of DME, and UE functional use, cognitive skills including, and psychosocial skills including coping strategies, environmental adaptation, interpersonal interactions, and routines and behaviors.   IMPAIRMENTS: are limiting patient from ADLs, IADLs, and leisure.   CO-MORBIDITIES: may have co-morbidities  that affects occupational performance. Patient will benefit from skilled OT to address above impairments and improve overall function.  MODIFICATION OR ASSISTANCE TO COMPLETE EVALUATION: Min-Moderate modification of tasks or assist with assess necessary to complete an evaluation.  OT OCCUPATIONAL PROFILE AND HISTORY: Detailed assessment: Review of records and additional review of physical, cognitive, psychosocial history related to current functional performance.  CLINICAL DECISION MAKING: Moderate - several treatment options, min-mod task modification necessary  REHAB POTENTIAL: Good  EVALUATION COMPLEXITY: Moderate    PLAN:  OT FREQUENCY: 2x/week  OT  DURATION: 12 weeks  PLANNED INTERVENTIONS: 97535 self care/ADL training, 02889 therapeutic exercise, 97530 therapeutic activity, 97112 neuromuscular re-education, 97140 manual therapy, 97018 paraffin, 02989 moist heat, 97034 contrast bath, balance training, functional mobility training, energy conservation, coping strategies training, patient/family education, and DME and/or AE instructions  RECOMMENDED OTHER SERVICES: PT  CONSULTED AND AGREED WITH PLAN OF CARE: Patient  PLAN FOR NEXT SESSION: Treatment  Elston Slot, M.S. OTR/L  10/20/23, 8:08 AM  ascom [phone]  10/20/2023, 8:08 AM

## 2023-10-20 ENCOUNTER — Encounter: Payer: Self-pay | Admitting: Occupational Therapy

## 2023-10-20 ENCOUNTER — Ambulatory Visit: Admitting: Occupational Therapy

## 2023-10-20 ENCOUNTER — Ambulatory Visit: Admitting: Physical Therapy

## 2023-10-20 DIAGNOSIS — R2681 Unsteadiness on feet: Secondary | ICD-10-CM | POA: Diagnosis not present

## 2023-10-20 DIAGNOSIS — R278 Other lack of coordination: Secondary | ICD-10-CM

## 2023-10-20 DIAGNOSIS — M6281 Muscle weakness (generalized): Secondary | ICD-10-CM

## 2023-10-20 DIAGNOSIS — R262 Difficulty in walking, not elsewhere classified: Secondary | ICD-10-CM

## 2023-10-20 DIAGNOSIS — R269 Unspecified abnormalities of gait and mobility: Secondary | ICD-10-CM

## 2023-10-20 NOTE — Therapy (Signed)
 OUTPATIENT PHYSICAL THERAPY NEURO TREATMENT     Patient Name: George Bruce MRN: [id number] DOB:05-15-1945, 78 y.o., male Today's Date: 10/20/2023   PCP: Auston Reyes BIRCH, MD  REFERRING PROVIDER: Auston Reyes BIRCH, MD   END OF SESSION:   PT End of Session - 10/20/23 0750     Visit Number 23    Number of Visits 24    Date for PT Re-Evaluation 10/22/23    Progress Note Due on Visit 30    PT Start Time 0845    PT Stop Time 0925    PT Time Calculation (min) 40 min    Equipment Utilized During Treatment Gait belt    Activity Tolerance Patient tolerated treatment well    Behavior During Therapy WFL for tasks assessed/performed            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   Hypertension    Wears dentures    partial upper and lower   Past Surgical History:  Procedure Laterality Date   CATARACT EXTRACTION W/PHACO Right 09/29/2022   Procedure: CATARACT EXTRACTION PHACO AND INTRAOCULAR LENS PLACEMENT (IOC) RIGHT  10.98  00:56.5;  Surgeon: Myrna Adine Anes, MD;  Location: [facility];  Service: Ophthalmology;  Laterality: Right;   CATARACT EXTRACTION W/PHACO Left 10/13/2022   Procedure: CATARACT EXTRACTION PHACO AND INTRAOCULAR LENS PLACEMENT (IOC) LEFT 10.79 00:54.1;  Surgeon: Myrna Adine Anes, MD;  Location: [facility];  Service: Ophthalmology;  Laterality: Left;   COLONOSCOPY WITH PROPOFOL  N/A 04/21/2018   Procedure: COLONOSCOPY WITH PROPOFOL ;  Surgeon: Janalyn Keene NOVAK, MD;  Location: [facility];  Service: Endoscopy;  Laterality: N/A;   INGUINAL HERNIA REPAIR Left 1990's   INGUINAL HERNIA REPAIR Right 2015   REVERSE SHOULDER ARTHROPLASTY Left 11/06/2015   Procedure: REVERSE SHOULDER ARTHROPLASTY;  Surgeon: Norleen JINNY Maltos, MD;  Location: [facility];  Service: Orthopedics;  Laterality: Left;   REVERSE SHOULDER ARTHROPLASTY Right 03/25/2022   Procedure: REVERSE SHOULDER ARTHROPLASTY;  Surgeon: Maltos Norleen JINNY, MD;  Location: [facility];  Service: Orthopedics;  Laterality: Right;  MAKE 2ND CASE   Patient Active Problem List   Diagnosis Date Noted   Acute CVA (cerebrovascular accident) (HCC) 07/21/2023   Elevated blood pressure reading 07/21/2023   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   Acute diverticulitis 05/07/2018   History of colonic polyps    Benign neoplasm of ascending colon    Diverticulosis of large intestine without diverticulitis    Erectile dysfunction due to arterial insufficiency 02/18/2018   Degenerative disc disease, lumbar 08/26/2016   Bilateral hip pain 06/24/2016   Chronic midline low back pain 06/24/2016   Polyarthralgia 06/24/2016   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   Hematospermia 05/03/2015   Colon polyps 08/16/2013   Hematuria 08/16/2013   Hyperlipidemia 08/16/2013   Incomplete emptying of bladder 06/23/2013   Benign prostatic hyperplasia with incomplete bladder emptying 11/11/2011   Elevated PSA 11/11/2011   Reduced libido 11/11/2011    ONSET DATE: 07/20/2023 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, right PCA occlusion   REFERRING DIAG: I63.50 (ICD-10-CM) - Cerebral infarction due to unspecified occlusion or stenosis of  unspecified cerebral artery   THERAPY DIAG:  Muscle weakness (generalized)  Other lack of coordination  Unsteadiness on feet  Abnormality of gait  Difficulty in walking, not elsewhere classified  Rationale for Evaluation and Treatment: Rehabilitation  SUBJECTIVE:                                                                                                                                                                                             SUBJECTIVE  STATEMENT:  Pt reports doing well today. Pt denies any recent falls/stumbles since prior session. Pt denies any updates to medications or medical appointment since prior session. Pt reports good compliance with HEP when time permits. Brings in personal left hand golf club for practice   Accompanied by self today  From Initial Eval: Pt states he has hx of sciatica in L LE. Pt states recent CVA affected his L side and he is having numbness in L UE and LE. Pt states the CVA affected him from his jaw down through his L arm and L LE. Pt states he is left hand dominant. Pt states he was walking ~32miles daily and performing all household chores prior to CVA. Patient states he was very active and completely independent prior.   Pt states he had MRI yesterday for L LE sciatica and is going to see MD tomorrow to follow-up on results (with [facility]). Patient reporting current pain as 6/10 with it being greater this morning, but he took pain medication to ease it off prior to coming to therapy.   Pt accompanied by: significant other, Wife, [name]  PERTINENT HISTORY: PMH of Hyperlipidemia, BPH, HTN, hx of basal cell carcinoma, hx of squamous cell carcinoma, arthritis, R and L reverse shoulder arthroplasties (R in 2024 and L in 2017)  PAIN:  Are you having pain? Yes: NPRS scale: 6/10 after taking pain medication this AM Pain location: L LE sciatica Pain description: severe ache Aggravating factors: he can wake up with it, no specific aggravating factors Relieving factors: pt reports having to take pain medication (hydrocodone ) this AM, hx of having injections and prednisone ; uses horse liniment   PRECAUTIONS: Fall  RED FLAGS: None   WEIGHT BEARING RESTRICTIONS: No  FALLS: Has patient fallen in last 6 months? No and but states had a few stumbles getting his feet tangled up  LIVING ENVIRONMENT: Lives with: lives with their spouse Lives in: House/apartment Stairs: Yes: External: 3+1 steps; on  right going up; 1 level house Has following equipment at home: Walker - 2 wheeled and built-in shower seat  PLOF: Independent, Independent with household mobility without device, Independent with homemaking with  ambulation, Independent with gait, and Leisure: golf  PATIENT GOALS: get back to half normal being able to do yard work, wash a car, and golf  OBJECTIVE:  Note: Objective measures were completed at Evaluation unless otherwise noted.  DIAGNOSTIC FINDINGS:  EXAM: MRI HEAD WITHOUT CONTRAST   TECHNIQUE: Multiplanar, multiecho pulse sequences of the brain and surrounding structures were obtained without intravenous contrast.   COMPARISON:  CT head, CTA, CTP yesterday.  Brain MRI 02/22/2008.  IMPRESSION: 1. Positive for Acute lacunar infarct of the lateral Right Thalamus, but also abnormal diffusion throughout the right hippocampal formation. Both are Right PCA territory, although superimposed seizure related changes of the right hippocampus would be difficult to exclude. No associated hemorrhage or mass effect.  2. 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 No other acute intracranial abnormality identified.   Electronically Signed   By: VEAR Hurst M.D.   On: 07/21/2023 05:51  CTA head:   1. 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 There is some reconstitution of enhancement within right posterior cerebral artery branches beginning at the distal P2 segment level. 2. 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 3. Atherosclerotic plaque within the intracranial internal carotid arteries with no more than mild stenosis.     Electronically Signed   By: Rockey Childs D.O.   On: 07/20/2023  19:32   COGNITION: Overall cognitive status: Within functional limits for tasks assessed   SENSATION: Light touch: Impaired  and basically absent Proprioception: Impaired  Can only feel deep pressure in L LE  COORDINATION: Incoordinated in L LE, likely due to sensory impairments  EDEMA:  Not formally assessed  MUSCLE TONE: Not formally assessed  MUSCLE LENGTH: Not formally assessed  DTRs:  Not formally assessed  POSTURE: rounded shoulders and forward head  LOWER EXTREMITY ROM:     Active  Right Eval  WFL/WNL throughout Left Eval  WFL/WNL throughout  Hip flexion    Hip extension    Hip abduction    Hip adduction    Hip internal rotation    Hip external rotation    Knee flexion    Knee extension    Ankle dorsiflexion    Ankle plantarflexion    Ankle inversion    Ankle eversion     (Blank rows = not tested)  LOWER EXTREMITY MMT:    MMT Right Eval Left Eval  Hip flexion 4+ 3+  Hip extension    Hip abduction    Hip adduction    Hip internal rotation    Hip external rotation    Knee flexion 4+ 4-  Knee extension 4+ 4+  Ankle dorsiflexion 4+ 3  Ankle plantarflexion 4+ 4-  Ankle inversion    Ankle eversion    (Blank rows = not tested)  Manual Muscle Test Scale 0/5 = No muscle contraction can be seen or felt 1/5 = Contraction can be felt, but there is no motion 2-/5 = Part moves through incomplete ROM w/ gravity decreased 2/5 = Part moves through complete ROM w/ gravity decreased 2+/5 = Part moves through incomplete ROM (<50%) against gravity or through complete ROM w/ gravity 3-/5 = Part moves through incomplete ROM (>50%) against gravity 3/5 = Part moves through complete ROM against  gravity 3+/5 = Part moves through complete ROM against gravity/slight resistance 4-/5= Holds test position against slight to moderate pressure 4/5 = Part moves through complete ROM against gravity/moderate resistance 4+/5= Holds test position against moderate to  strong pressure 5/5 = Part moves through complete ROM against gravity/full resistance  BED MOBILITY:  Not tested, pt denies difficulty with this  TRANSFERS: Sit to stand: SBA and CGA  Assistive device utilized: Environmental consultant - 2 wheeled and None     Stand to sit: SBA and CGA  Assistive device utilized: Environmental consultant - 2 wheeled and None     Chair to chair: SBA and CGA  Assistive device utilized: Environmental consultant - 2 wheeled and None      *requires CGA when not using AD* RAMP:  Not tested  CURB:  Not tested  STAIRS: Findings: Level of Assistance: CGA and Min A, Stair Negotiation Technique: Step to Pattern Forwards with Single Rail on Right, Number of Stairs: 4, Height of Stairs: 6in   , and Comments: incoordinated, but no greater than min A for balance; only used R HR to simulate home environment GAIT: Findings: Gait Characteristics: step through pattern, decreased arm swing- Right, decreased arm swing- Left, decreased step length- Right, decreased step length- Left, decreased stride length, and wide BOS, Distance walked: 28ft + 355ft, Assistive device utilized:None, Level of assistance: CGA and Min A, and Comments: pt uses RW for all ambulation at home, but didn't use AD during session to assess patient's gait without  FUNCTIONAL TESTS:  5 times sit to stand: 15.12 seconds with arms across chest  6 minute walk test: need to assess 10 meter walk test: 0.93 m/s without AD with CGA for safety  Berg Balance Scale: need to assess Functional gait assessment: need to assess   PATIENT SURVEYS:  Stroke Impact Scale 35/80                                                                                                                              TREATMENT DATE: 10/20/2023  TA- To improve functional movements patterns for everyday tasks    Weighted gait with 3# AW x 511ft. Cues for consistent step width.   PT instructed pt in rotations functional movement training to swing golf club. Hitting spinning mat to  protect floor. from level surface: 30-40 % swing  x 10 .  Standing on red mat: 30-40% swing x 5 then 50% swing x 8  Standing on airex pad. Chip shot x 8    Ambulation carrying golf club to cancer garden x 580ft  Pt then performed golf swing standing on mulched surface at 75-100% x 8 as well as on grass at 75% x 6  Pt reports feeling a little wobbly after swinging golf club and gait through mulch.   Community mobility training to ambulate through mulch, cemnt sidewalk, paved ramp, and grass >1040ft.   Pt was noted to have mild dyskinesia prior to golf swing throughout  session, especially on unlevel surface, but no dyskinesia through golf swing. Following swing, mild dyskinesia returned.   PATIENT EDUCATION: Education details:Pt educated throughout session about proper posture and technique with exercises. Improved exercise technique, movement at target joints, use of target muscles after min to mod verbal, visual, tactile cues.  S/s expected with PCA CVA.   Person educated: Patient Education method: Explanation Education comprehension: verbalized understanding and needs further education  HOME EXERCISE PROGRAM:  Access Code: [email or web address] URL: [email or web address] Date: 10/06/2023 Prepared by: Massie Dollar  Exercises - Standing March with Unilateral Counter Support  - 1 x daily - 5 x weekly - 2 sets - 10 reps - Sit to Stand Without Arm Support  - 1 x daily - 5 x weekly - 2 sets - 10 reps - Side Stepping with Counter Support  - 1 x daily - 5 x weekly - 3 sets - 10 reps - Lunge with Counter Support  - 1 x daily - 5 x weekly - 3 sets - 10 reps - Tandem Stance with Support  - 1 x daily - 5 x weekly - 3 sets - 4 reps - 20sec  hold - Single Leg Stance with Support  - 1 x daily - 5 x weekly - 3 sets - 4 reps - 20 sec  hold - Backward Walking with Counter Support  - 1 x daily - 5 x weekly - 3 sets - 6 reps  GOALS: Goals reviewed with patient? Yes  SHORT TERM GOALS: Target  date: 09/10/2023  Patient will be independent in home exercise program to improve strength/mobility for better functional independence with ADLs and functional mobility.  Baseline: provided on 5/29  Goal status: In progress   LONG TERM GOALS: Target date: 10/22/2023  Patient (> 62 years old) will complete five times sit to stand test (5XSTS) in < 12 seconds indicating an increased LE strength and improved balance.  Baseline: 15.12 seconds with arms across chest 6/24: 11.9 sec no UE support  Goal status: MET  2.  Patient will increase Berg Balance score to > 51/56 to demonstrate improved balance and decreased fall risk during functional activities and ADLs.  Baseline: 38/56 6/24: 44/56 8/1: 53 Goal status: INITIAL  3.  Patient will increase six minute walk test ( ) distance to >1066ft for progression to community ambulator and improve gait ability  Baseline: 1,027 feet (313 meters, Avg speed 0.864m/s), no AD, with CGA and occasional min A 6/24: 1166ft no  Goal status: MET  4.  Patient will increase 10 meter walk test to >1.25m/s as to improve gait speed for better community ambulation and to reduce fall risk. Baseline: 0.93 m/s without AD with CGA  6/24: 0.94ms  8/1: 1.05 m/s  Goal status: MET  5.  Patient will increase Functional Gait Assessment (FGA) score to >20/30 as to reduce fall risk and improve dynamic gait safety with community ambulation.  Baseline:  8/1:20 Goal status: ONGOING  6.  Patient will improve Stroke Impact Scale-16 by >9 points indicating patient experiencing increased independence with functional mobility tasks and ADLs to improve quality of life and decrease caregiver burden.  Baseline: 35/80 6/24: 69/80 Goal status: MET  ASSESSMENT:  CLINICAL IMPRESSION:   Patient arrived with good motivation for completion of pt activities.  PT treatment focused on functional movement patterns. Pt demonstrates mild dyskinesia on unlevel surface. But improved BLE  motor control while performing golf swing. Tolerated gait over unlevel surface well, with no LOB, but states that he  feels a little wobbly following gait over variable surface. Pt will continue to benefit from skilled physical therapy intervention to address impairments, improve QOL, and attain therapy goals.     OBJECTIVE IMPAIRMENTS: Abnormal gait, decreased activity tolerance, decreased balance, decreased coordination, decreased endurance, decreased knowledge of condition, decreased knowledge of use of DME, decreased mobility, difficulty walking, decreased strength, increased fascial restrictions, impaired flexibility, impaired sensation, impaired UE functional use, improper body mechanics, and pain.   ACTIVITY LIMITATIONS: carrying, lifting, bending, sitting, standing, squatting, stairs, transfers, bathing, toileting, dressing, reach over head, hygiene/grooming, and locomotion level  PARTICIPATION LIMITATIONS: meal prep, cleaning, laundry, interpersonal relationship, driving, shopping, community activity, and yard work  PERSONAL FACTORS: Age, Fitness, Time since onset of injury/illness/exacerbation, and 3+ comorbidities: Hyperlipidemia, BPH, HTN, hx of basal cell carcinoma, hx of squamous cell carcinoma, arthritis, R and L reverse shoulder arthroplasties (R in 2024 and L in 2017) are also affecting patient's functional outcome.   REHAB POTENTIAL: Excellent  CLINICAL DECISION MAKING: Evolving/moderate complexity  EVALUATION COMPLEXITY: Moderate  PLAN:  PT FREQUENCY: 1-2x/week  PT DURATION: 12 weeks  PLANNED INTERVENTIONS: 97164- PT Re-evaluation, 97750- Physical Performance Testing, 97110-Therapeutic exercises, 97530- Therapeutic activity, 97112- Neuromuscular re-education, 97535- Self Care, [id number]- Manual therapy, [phone]- Gait training, [phone]- Orthotic Initial, [phone]- Orthotic/Prosthetic subsequent, [phone]- Canalith repositioning, [phone]- Electrical stimulation (manual), Patient/Family  education, Balance training, Stair training, Taping, Dry Needling, Joint mobilization, Joint manipulation, Spinal mobilization, Vestibular training, Visual/preceptual remediation/compensation, DME instructions, Cryotherapy, Moist heat, and Biofeedback  PLAN FOR NEXT SESSION:  - participate in higher level dynamic balance and gait challenges including:  - horizontal and vertical head turns  - stepping towards external targets due to LLE incoordination  - gait in variable directions  - unstable surfaces - step-ups for B LE strengthening with simultaneous dynamic balance tasks - progress to practicing golf swing for return to recreational activities - Ascending and descending stairs without UE  - 1 leg balance progressive activities     Massie Dollar PT, DPT  Physical Therapist - [facility]  [facility]  7:53 AM 10/20/23

## 2023-10-22 ENCOUNTER — Ambulatory Visit

## 2023-10-22 ENCOUNTER — Ambulatory Visit: Admitting: Physical Therapy

## 2023-10-22 DIAGNOSIS — R278 Other lack of coordination: Secondary | ICD-10-CM

## 2023-10-22 DIAGNOSIS — I69352 Hemiplegia and hemiparesis following cerebral infarction affecting left dominant side: Secondary | ICD-10-CM

## 2023-10-22 DIAGNOSIS — R2681 Unsteadiness on feet: Secondary | ICD-10-CM

## 2023-10-22 DIAGNOSIS — R262 Difficulty in walking, not elsewhere classified: Secondary | ICD-10-CM

## 2023-10-22 DIAGNOSIS — M6281 Muscle weakness (generalized): Secondary | ICD-10-CM

## 2023-10-22 DIAGNOSIS — R269 Unspecified abnormalities of gait and mobility: Secondary | ICD-10-CM

## 2023-10-22 NOTE — Therapy (Signed)
 OUTPATIENT PHYSICAL THERAPY NEURO TREATMENT/ Discharge PT     Patient Name: George Bruce MRN: [id number] DOB:November 13, 1945, 78 y.o., male Today's Date: 10/22/2023   PCP: Auston Reyes BIRCH, MD  REFERRING PROVIDER: Auston Reyes BIRCH, MD   END OF SESSION:   PT End of Session - 10/22/23 0845     Visit Number 24    Number of Visits 24    Date for PT Re-Evaluation 10/22/23    Progress Note Due on Visit 30    PT Start Time 0845    PT Stop Time 0926    PT Time Calculation (min) 41 min    Equipment Utilized During Treatment Gait belt    Activity Tolerance Patient tolerated treatment well    Behavior During Therapy WFL for tasks assessed/performed             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   Hypertension    Wears dentures    partial upper and lower   Past Surgical History:  Procedure Laterality Date   CATARACT EXTRACTION W/PHACO Right 09/29/2022   Procedure: CATARACT EXTRACTION PHACO AND INTRAOCULAR LENS PLACEMENT (IOC) RIGHT  10.98  00:56.5;  Surgeon: Myrna Adine Anes, MD;  Location: [facility];  Service: Ophthalmology;  Laterality: Right;   CATARACT EXTRACTION W/PHACO Left 10/13/2022   Procedure: CATARACT EXTRACTION PHACO AND INTRAOCULAR LENS PLACEMENT (IOC) LEFT 10.79 00:54.1;  Surgeon: Myrna Adine Anes, MD;  Location: [facility];  Service: Ophthalmology;  Laterality: Left;   COLONOSCOPY WITH PROPOFOL  N/A 04/21/2018   Procedure: COLONOSCOPY WITH PROPOFOL ;  Surgeon: Janalyn Keene NOVAK, MD;  Location: [facility];  Service: Endoscopy;  Laterality: N/A;   INGUINAL HERNIA REPAIR Left 1990's   INGUINAL HERNIA REPAIR Right 2015   REVERSE SHOULDER ARTHROPLASTY Left 11/06/2015   Procedure: REVERSE SHOULDER ARTHROPLASTY;   Surgeon: Norleen JINNY Maltos, MD;  Location: [facility];  Service: Orthopedics;  Laterality: Left;   REVERSE SHOULDER ARTHROPLASTY Right 03/25/2022   Procedure: REVERSE SHOULDER ARTHROPLASTY;  Surgeon: Maltos Norleen JINNY, MD;  Location: [facility];  Service: Orthopedics;  Laterality: Right;  MAKE 2ND CASE   Patient Active Problem List   Diagnosis Date Noted   Acute CVA (cerebrovascular accident) (HCC) 07/21/2023   Elevated blood pressure reading 07/21/2023   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   Acute diverticulitis 05/07/2018   History of colonic polyps    Benign neoplasm of ascending colon    Diverticulosis of large intestine without diverticulitis    Erectile dysfunction due to arterial insufficiency 02/18/2018   Degenerative disc disease, lumbar 08/26/2016   Bilateral hip pain 06/24/2016   Chronic midline low back pain 06/24/2016   Polyarthralgia 06/24/2016   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   Hematospermia 05/03/2015   Colon polyps 08/16/2013   Hematuria 08/16/2013   Hyperlipidemia 08/16/2013   Incomplete emptying of bladder 06/23/2013   Benign prostatic hyperplasia with incomplete bladder emptying 11/11/2011   Elevated PSA 11/11/2011   Reduced libido 11/11/2011    ONSET DATE: 07/20/2023 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, right PCA occlusion   REFERRING DIAG: I63.50 (ICD-10-CM) - Cerebral infarction due to unspecified occlusion  or stenosis of unspecified cerebral artery   THERAPY DIAG:  Muscle weakness (generalized)  Unsteadiness on feet  Abnormality of gait  Difficulty in walking, not elsewhere classified  Rationale for Evaluation and Treatment: Rehabilitation  SUBJECTIVE:                                                                                                                                                                                             SUBJECTIVE  STATEMENT:  Pt reports doing well today. Pt denies any recent falls/stumbles since prior session. Pt denies any updates to medications or medical appointment since prior session. Pt reports good compliance with HEP when time permits. Pt has some decreased confidence with balance but feels good about where he is at.    Accompanied by self today  From Initial Eval: Pt states he has hx of sciatica in L LE. Pt states recent CVA affected his L side and he is having numbness in L UE and LE. Pt states the CVA affected him from his jaw down through his L arm and L LE. Pt states he is left hand dominant. Pt states he was walking ~48miles daily and performing all household chores prior to CVA. Patient states he was very active and completely independent prior.   Pt states he had MRI yesterday for L LE sciatica and is going to see MD tomorrow to follow-up on results (with [facility]). Patient reporting current pain as 6/10 with it being greater this morning, but he took pain medication to ease it off prior to coming to therapy.   Pt accompanied by: significant other, Wife, [name]  PERTINENT HISTORY: PMH of Hyperlipidemia, BPH, HTN, hx of basal cell carcinoma, hx of squamous cell carcinoma, arthritis, R and L reverse shoulder arthroplasties (R in 2024 and L in 2017)  PAIN:  Are you having pain? Yes: NPRS scale: 6/10 after taking pain medication this AM Pain location: L LE sciatica Pain description: severe ache Aggravating factors: he can wake up with it, no specific aggravating factors Relieving factors: pt reports having to take pain medication (hydrocodone ) this AM, hx of having injections and prednisone ; uses horse liniment   PRECAUTIONS: Fall  RED FLAGS: None   WEIGHT BEARING RESTRICTIONS: No  FALLS: Has patient fallen in last 6 months? No and but states had a few stumbles getting his feet tangled up  LIVING ENVIRONMENT: Lives with: lives with their spouse Lives in: House/apartment Stairs:  Yes: External: 3+1 steps; on right going up; 1 level house Has following equipment at home: Walker - 2 wheeled and built-in shower seat  PLOF: Independent, Independent with household mobility without  device, Independent with homemaking with ambulation, Independent with gait, and Leisure: golf  PATIENT GOALS: get back to half normal being able to do yard work, wash a car, and golf  OBJECTIVE:  Note: Objective measures were completed at Evaluation unless otherwise noted.  DIAGNOSTIC FINDINGS:  EXAM: MRI HEAD WITHOUT CONTRAST   TECHNIQUE: Multiplanar, multiecho pulse sequences of the brain and surrounding structures were obtained without intravenous contrast.   COMPARISON:  CT head, CTA, CTP yesterday.  Brain MRI 02/22/2008.  IMPRESSION: 1. Positive for Acute lacunar infarct of the lateral Right Thalamus, but also abnormal diffusion throughout the right hippocampal formation. Both are Right PCA territory, although superimposed seizure related changes of the right hippocampus would be difficult to exclude. No associated hemorrhage or mass effect.  2. 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 No other acute intracranial abnormality identified.   Electronically Signed   By: VEAR Hurst M.D.   On: 07/21/2023 05:51  CTA head:   1. 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 There is some reconstitution of enhancement within right posterior cerebral artery branches beginning at the distal P2 segment level. 2. 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 3. Atherosclerotic plaque within the intracranial internal carotid arteries with no more than mild stenosis.     Electronically Signed   By: Rockey Childs  D.O.   On: 07/20/2023 19:32   COGNITION: Overall cognitive status: Within functional limits for tasks assessed   SENSATION: Light touch: Impaired  and basically absent Proprioception: Impaired  Can only feel deep pressure in L LE  COORDINATION: Incoordinated in L LE, likely due to sensory impairments  EDEMA:  Not formally assessed  MUSCLE TONE: Not formally assessed  MUSCLE LENGTH: Not formally assessed  DTRs:  Not formally assessed  POSTURE: rounded shoulders and forward head  LOWER EXTREMITY ROM:     Active  Right Eval  WFL/WNL throughout Left Eval  WFL/WNL throughout  Hip flexion    Hip extension    Hip abduction    Hip adduction    Hip internal rotation    Hip external rotation    Knee flexion    Knee extension    Ankle dorsiflexion    Ankle plantarflexion    Ankle inversion    Ankle eversion     (Blank rows = not tested)  LOWER EXTREMITY MMT:    MMT Right Eval Left Eval  Hip flexion 4+ 3+  Hip extension    Hip abduction    Hip adduction    Hip internal rotation    Hip external rotation    Knee flexion 4+ 4-  Knee extension 4+ 4+  Ankle dorsiflexion 4+ 3  Ankle plantarflexion 4+ 4-  Ankle inversion    Ankle eversion    (Blank rows = not tested)  Manual Muscle Test Scale 0/5 = No muscle contraction can be seen or felt 1/5 = Contraction can be felt, but there is no motion 2-/5 = Part moves through incomplete ROM w/ gravity decreased 2/5 = Part moves through complete ROM w/ gravity decreased 2+/5 = Part moves through incomplete ROM (<50%) against gravity or through complete ROM w/ gravity 3-/5 = Part moves through incomplete ROM (>50%) against gravity 3/5 = Part  moves through complete ROM against gravity 3+/5 = Part moves through complete ROM against gravity/slight resistance 4-/5= Holds test position against slight to moderate pressure 4/5 = Part moves through complete ROM against gravity/moderate resistance 4+/5= Holds test position  against moderate to strong pressure 5/5 = Part moves through complete ROM against gravity/full resistance  BED MOBILITY:  Not tested, pt denies difficulty with this  TRANSFERS: Sit to stand: SBA and CGA  Assistive device utilized: Environmental consultant - 2 wheeled and None     Stand to sit: SBA and CGA  Assistive device utilized: Environmental consultant - 2 wheeled and None     Chair to chair: SBA and CGA  Assistive device utilized: Environmental consultant - 2 wheeled and None      *requires CGA when not using AD* RAMP:  Not tested  CURB:  Not tested  STAIRS: Findings: Level of Assistance: CGA and Min A, Stair Negotiation Technique: Step to Pattern Forwards with Single Rail on Right, Number of Stairs: 4, Height of Stairs: 6in   , and Comments: incoordinated, but no greater than min A for balance; only used R HR to simulate home environment GAIT: Findings: Gait Characteristics: step through pattern, decreased arm swing- Right, decreased arm swing- Left, decreased step length- Right, decreased step length- Left, decreased stride length, and wide BOS, Distance walked: 66ft + 32ft, Assistive device utilized:None, Level of assistance: CGA and Min A, and Comments: pt uses RW for all ambulation at home, but didn't use AD during session to assess patient's gait without  FUNCTIONAL TESTS:  5 times sit to stand: 15.12 seconds with arms across chest  6 minute walk test: need to assess 10 meter walk test: 0.93 m/s without AD with CGA for safety  Berg Balance Scale: need to assess Functional gait assessment: need to assess   PATIENT SURVEYS:  Stroke Impact Scale 35/80                                                                                                                              TREATMENT DATE: 10/22/2023 Physical Performance Test or Measurement: a  physical performance test(s) or measurement (eg,  musculoskeletal, functional capacity), with written report,  each 15 mins   Pt scores 26 / 28 on mini BEST balance test. Scores  < 16 indicate increased risk for falls Mayer, [place], & [place]) and MCID is 4 (Godi,et al, 2013)   [facility] PT Assessment - 10/22/23 0001       Standardized Balance Assessment   Standardized Balance Assessment Mini-BESTest      Mini-BESTest   Sit To Stand Normal: Comes to stand without use of hands and stabilizes independently.    Rise to Toes Normal: Stable for 3 s with maximum height.    Stand on one leg (left) Moderate: < 20 s    Stand on one leg (right) Moderate: < 20 s    Stand on one leg - lowest score 1    Compensatory  Stepping Correction - Forward Normal: Recovers independently with a single, large step (second realignement is allowed).    Compensatory Stepping Correction - Backward Normal: Recovers independently with a single, large step    Compensatory Stepping Correction - Left Lateral Normal: Recovers independently with 1 step (crossover or lateral OK)    Compensatory Stepping Correction - Right Lateral Normal: Recovers independently with 1 step (crossover or lateral OK)    Stepping Corredtion Lateral - lowest score 2    Stance - Feet together, eyes open, firm surface  Normal: 30s    Stance - Feet together, eyes closed, foam surface  Normal: 30s    Incline - Eyes Closed Normal: Stands independently 30s and aligns with gravity    Change in Gait Speed Normal: Significantly changes walkling speed without imbalance    Walk with head turns - Horizontal Normal: performs head turns with no change in gait speed and good balance    Walk with pivot turns Normal: Turns with feet close FAST (< 3 steps) with good balance.    Step over obstacles Normal: Able to step over box with minimal change of gait speed and with good balance.    Timed UP & GO with Dual Task Moderate: Dual Task affects either counting OR walking (>10%) when compared to the TUG without Dual Task.    Mini-BEST total score 26      Functional Gait  Assessment   Gait Level Surface Walks 20 ft in less than 5.5 sec, no  assistive devices, good speed, no evidence for imbalance, normal gait pattern, deviates no more than 6 in outside of the 12 in walkway width.    Change in Gait Speed Able to smoothly change walking speed without loss of balance or gait deviation. Deviate no more than 6 in outside of the 12 in walkway width.    Gait with Horizontal Head Turns Performs head turns smoothly with no change in gait. Deviates no more than 6 in outside 12 in walkway width    Gait with Vertical Head Turns Performs head turns with no change in gait. Deviates no more than 6 in outside 12 in walkway width.    Gait and Pivot Turn Pivot turns safely in greater than 3 sec and stops with no loss of balance, or pivot turns safely within 3 sec and stops with mild imbalance, requires small steps to catch balance.    Step Over Obstacle Is able to step over 2 stacked shoe boxes taped together (9 in total height) without changing gait speed. No evidence of imbalance.    Gait with Narrow Base of Support Ambulates 7-9 steps.    Gait with Eyes Closed Walks 20 ft, no assistive devices, good speed, no evidence of imbalance, normal gait pattern, deviates no more than 6 in outside 12 in walkway width. Ambulates 20 ft in less than 7 sec.    Ambulating Backwards Walks 20 ft, no assistive devices, good speed, no evidence for imbalance, normal gait    Steps Alternating feet, no rail.    Total Score 28           Pt participated in Functional Gait Assessment (FGA) with score of 28/30 demonstrating low fall risk (low fall risk 25-28, medium fall risk 19-24, and high fall risk <19).   TA- To improve functional movements patterns for everyday tasks   Golf swing practice outside on thick grass and on mulch, good balance, no overt LOB.   Golf chipping practice x 12 balls and  picking up each ball and finding each ball.   Pt was noted to have mild dyskinesia prior to golf swing throughout session, especially on unlevel surface, but no dyskinesia  through golf swing. Following swing, mild dyskinesia returned.   PATIENT EDUCATION: Education details:Pt educated throughout session about proper posture and technique with exercises. Improved exercise technique, movement at target joints, use of target muscles after min to mod verbal, visual, tactile cues.  S/s expected with PCA CVA.   Person educated: Patient Education method: Explanation Education comprehension: verbalized understanding and needs further education  HOME EXERCISE PROGRAM:  Access Code: [email or web address] URL: [email or web address] Date: 10/06/2023 Prepared by: Massie Dollar  Exercises - Standing March with Unilateral Counter Support  - 1 x daily - 5 x weekly - 2 sets - 10 reps - Sit to Stand Without Arm Support  - 1 x daily - 5 x weekly - 2 sets - 10 reps - Side Stepping with Counter Support  - 1 x daily - 5 x weekly - 3 sets - 10 reps - Lunge with Counter Support  - 1 x daily - 5 x weekly - 3 sets - 10 reps - Tandem Stance with Support  - 1 x daily - 5 x weekly - 3 sets - 4 reps - 20sec  hold - Single Leg Stance with Support  - 1 x daily - 5 x weekly - 3 sets - 4 reps - 20 sec  hold - Backward Walking with Counter Support  - 1 x daily - 5 x weekly - 3 sets - 6 reps  GOALS: Goals reviewed with patient? Yes  SHORT TERM GOALS: Target date: 09/10/2023  Patient will be independent in home exercise program to improve strength/mobility for better functional independence with ADLs and functional mobility.  Baseline: provided on 5/29  Goal status: In progress   LONG TERM GOALS: Target date: 10/22/2023  Patient (> 87 years old) will complete five times sit to stand test (5XSTS) in < 12 seconds indicating an increased LE strength and improved balance.  Baseline: 15.12 seconds with arms across chest 6/24: 11.9 sec no UE support  Goal status: MET  2.  Patient will increase Berg Balance score to > 51/56 to demonstrate improved balance and decreased fall risk during  functional activities and ADLs.  Baseline: 38/56 6/24: 44/56 8/1: 53 Goal status: MET  3.  Patient will increase six minute walk test ( ) distance to >1013ft for progression to community ambulator and improve gait ability  Baseline: 1,027 feet (313 meters, Avg speed 0.839m/s), no AD, with CGA and occasional min A 6/24: 1152ft no  Goal status: MET  4.  Patient will increase 10 meter walk test to >1.70m/s as to improve gait speed for better community ambulation and to reduce fall risk. Baseline: 0.93 m/s without AD with CGA  6/24: 0.94ms  8/1: 1.05 m/s  Goal status: MET  5.  Patient will increase Functional Gait Assessment (FGA) score to >20/30 as to reduce fall risk and improve dynamic gait safety with community ambulation.  Baseline:  8/1:20 8/14:28 Goal status: MET  6.  Patient will improve Stroke Impact Scale-16 by >9 points indicating patient experiencing increased independence with functional mobility tasks and ADLs to improve quality of life and decrease caregiver burden.  Baseline: 35/80 6/24: 69/80 Goal status: MET  ASSESSMENT:  CLINICAL IMPRESSION:   Patient arrived with good motivation for completion of pt activities.  Pt assessed for discharge today. Pt completes FGA and mini Best showing minimal  impairments. Pt instructed to continue with exercises at home to improve dynamic balance like playing golf and chipping in the yard and was also instructed to work on building up his walking program starting at about 10 min. Pt will be discharged from skilled PT this date with advanced HEP in place and all necessary instructions given.   OBJECTIVE IMPAIRMENTS: Abnormal gait, decreased activity tolerance, decreased balance, decreased coordination, decreased endurance, decreased knowledge of condition, decreased knowledge of use of DME, decreased mobility, difficulty walking, decreased strength, increased fascial restrictions, impaired flexibility, impaired sensation, impaired UE  functional use, improper body mechanics, and pain.   ACTIVITY LIMITATIONS: carrying, lifting, bending, sitting, standing, squatting, stairs, transfers, bathing, toileting, dressing, reach over head, hygiene/grooming, and locomotion level  PARTICIPATION LIMITATIONS: meal prep, cleaning, laundry, interpersonal relationship, driving, shopping, community activity, and yard work  PERSONAL FACTORS: Age, Fitness, Time since onset of injury/illness/exacerbation, and 3+ comorbidities: Hyperlipidemia, BPH, HTN, hx of basal cell carcinoma, hx of squamous cell carcinoma, arthritis, R and L reverse shoulder arthroplasties (R in 2024 and L in 2017) are also affecting patient's functional outcome.   REHAB POTENTIAL: Excellent  CLINICAL DECISION MAKING: Evolving/moderate complexity  EVALUATION COMPLEXITY: Moderate  PLAN:  PT FREQUENCY: 1-2x/week  PT DURATION: 12 weeks  PLANNED INTERVENTIONS: [id number]- PT Re-evaluation, 97750- Physical Performance Testing, 97110-Therapeutic exercises, 97530- Therapeutic activity, [id number]- Neuromuscular re-education, 97535- Self Care, [id number]- Manual therapy, [id number]- Gait training, [phone]- Orthotic Initial, [phone]- Orthotic/Prosthetic subsequent, [phone]- Canalith repositioning, [phone]- Electrical stimulation (manual), Patient/Family education, Balance training, Stair training, Taping, Dry Needling, Joint mobilization, Joint manipulation, Spinal mobilization, Vestibular training, Visual/preceptual remediation/compensation, DME instructions, Cryotherapy, Moist heat, and Biofeedback  PLAN FOR NEXT SESSION:  D/C today     Note: Portions of this document were prepared using Dragon voice recognition software and although reviewed may contain unintentional dictation errors in syntax, grammar, or spelling.  Lonni KATHEE Gainer PT ,DPT Physical Therapist-   [facility]    9:41 AM 10/22/23

## 2023-10-25 NOTE — Therapy (Signed)
 OUTPATIENT OCCUPATIONAL THERAPY NEURO TREATMENT NOTE  Patient Name: George Bruce MRN: [id number] DOB:[birth year], 78 y.o., male Today's Date: 10/25/2023  PCP: Dr. Auston, MD REFERRING PROVIDER: Dr. 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   Hypertension    Wears dentures    partial upper and lower   Past Surgical History:  Procedure Laterality Date   CATARACT EXTRACTION W/PHACO Right 09/29/2022   Procedure: CATARACT EXTRACTION PHACO AND INTRAOCULAR LENS PLACEMENT (IOC) RIGHT  10.98  00:56.5;  Surgeon: Myrna Adine Anes, MD;  Location: [facility];  Service: Ophthalmology;  Laterality: Right;   CATARACT EXTRACTION W/PHACO Left 10/13/2022   Procedure: CATARACT EXTRACTION PHACO AND INTRAOCULAR LENS PLACEMENT (IOC) LEFT 10.79 00:54.1;  Surgeon: Myrna Adine Anes, MD;  Location: [facility];  Service: Ophthalmology;  Laterality: Left;   COLONOSCOPY WITH PROPOFOL  N/A 04/21/2018   Procedure: COLONOSCOPY WITH PROPOFOL ;  Surgeon: Janalyn Keene NOVAK, MD;  Location: [facility];  Service: Endoscopy;  Laterality: N/A;   INGUINAL HERNIA REPAIR Left 1990's   INGUINAL HERNIA REPAIR Right 2015   REVERSE SHOULDER ARTHROPLASTY Left 11/06/2015   Procedure: REVERSE SHOULDER ARTHROPLASTY;  Surgeon: Norleen JINNY Maltos, MD;  Location: [facility];  Service: Orthopedics;  Laterality: Left;   REVERSE SHOULDER ARTHROPLASTY Right  03/25/2022   Procedure: REVERSE SHOULDER ARTHROPLASTY;  Surgeon: Maltos Norleen JINNY, MD;  Location: [facility];  Service: Orthopedics;  Laterality: Right;  MAKE 2ND CASE   Patient Active Problem List   Diagnosis Date Noted   Acute CVA (cerebrovascular accident) (HCC) 07/21/2023   Elevated blood pressure reading 07/21/2023   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   Acute diverticulitis 05/07/2018   History of colonic polyps    Benign neoplasm of ascending colon    Diverticulosis of large intestine without diverticulitis    Erectile dysfunction due to arterial insufficiency 02/18/2018   Degenerative disc disease, lumbar 08/26/2016   Bilateral hip pain 06/24/2016   Chronic midline low back pain 06/24/2016   Polyarthralgia 06/24/2016   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   Hematospermia 05/03/2015   Colon polyps 08/16/2013   Hematuria 08/16/2013   Hyperlipidemia 08/16/2013   Incomplete emptying of bladder 06/23/2013   Benign prostatic hyperplasia with incomplete bladder emptying 11/11/2011   Elevated PSA 11/11/2011   Reduced libido 11/11/2011   ONSET DATE: 07/21/23  REFERRING DIAG: Acute Ischemic CVA  THERAPY DIAG:  Muscle weakness (generalized)  Other lack of coordination  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  Pt accompanied by: self  PERTINENT HISTORY: Pt. was admitted to the hospital from 5/13-5/14/25 with an Acute Ischemic Large Vessel RPCA Occlusion. PMHx includes: HTN, HLD, and BPH.  PRECAUTIONS: None, fall  WEIGHT  BEARING RESTRICTIONS: No  PAIN:  Are you having pain? no  FALLS: Has patient fallen in last 6 months? No  LIVING ENVIRONMENT: Lives with: lives with their spouse Lives in: House/apartment-I level Stairs: 4 steps in to the house with rail on the right Has following equipment at home: Single point cane and Walker - 2 wheeled  PLOF: Independent  PATIENT GOALS: To get back into yard work, and golfing  OBJECTIVE:  Note: Objective measures were completed at Evaluation unless otherwise noted.  HAND DOMINANCE: Left  ADLs:  Eating:  Difficulty with motor control-holding utensils with the Left hand Grooming: Difficulty with motor control holding the toothbrush with the Left hand. Starts out brushing his hair with the left hand then switches to the right hand. Shaves with the right hand. UB Dressing: Independent with pull over shirts. Difficulty buttoning LB Dressing:  Difficulty reaching around to place belt through the loops. Toileting: Independent Bathing: Independent  Tub Shower transfers:  uses bath mat  IADLs: Shopping: Independent reaching for items, Independent with the transaction efficient. Light housekeeping: cleans bathroom, wash dishes-does not put dishes away in cabinet making bed  Meal Prep:  Independent  light meal prep, cuts tomatoes Community mobility: Driving short distances Medication management: Wife lines them up. Difficulty grasping them with the left hand. Financial management: Is not able to write checks. Handwriting: 50% legible in cursive form. Work history: retired Midwife, yard work, visit with friends at [employer], and [employer]  MOBILITY STATUS: Modified Independent-balance issues.  ACTIVITY TOLERANCE: Activity tolerance:  Fair  FUNCTIONAL OUTCOME MEASURES: MAM-20 Sum score: 51/80  UPPER EXTREMITY ROM:    Active ROM Right eval Left eval  Shoulder flexion 132(150) 120(132)  Shoulder abduction 120(130) 110(116)   Shoulder adduction    Shoulder extension    Shoulder internal rotation    Shoulder external rotation    Elbow flexion [facility] WFL  Elbow extension [facility] WFL  Wrist flexion [facility] WFL  Wrist extension [facility] WFL  Wrist ulnar deviation    Wrist radial deviation    Wrist pronation WFL WFL  Wrist supination WFL WFL  (Blank rows = not tested)  UPPER EXTREMITY MMT:     MMT Right eval Left eval  Shoulder flexion    Shoulder abduction    Shoulder adduction    Shoulder extension    Shoulder internal rotation    Shoulder external rotation    Middle trapezius    Lower trapezius    Elbow flexion    Elbow extension    Wrist flexion    Wrist extension    Wrist ulnar deviation    Wrist radial deviation    Wrist pronation    Wrist supination    (Blank rows = not tested)  HAND FUNCTION: Grip strength: Right: 63 lbs; Left: 59 lbs, Lateral pinch: Right: 12 lbs, Left: 10 lbs, and 3 point pinch: Right: 12 lbs, Left: 8 lbs  COORDINATION: 9 Hole Peg test: Right: 31 sec; Left: 45 sec  SENSATION: Light touch: WFL Proprioception: WFL  EDEMA:  N/A  COGNITION: Overall cognitive status: Within functional limits for tasks  assessed  VISION:  No change from baseline  PERCEPTION: WFL  PRAXIS: Impaired motor control  TREATMENT DATE: 10/22/23 Self Care: -Handwriting focus with trial of various pen types and grips. -Pt practiced writing first and last names and numbers on lined paper with designated spaces outlined by therapist to simulate writing on a score card.   -Pt given intermittent min vc for UE positioning on table top to maximize distal stability while writing.   -Recommendation for pt to bring in score cards from home for practice in OT session next week and to continue to practice handwriting daily on lined paper and on score cards at home to provide external cue for letter height and straight lines.   PATIENT EDUCATION: Education details: Handwriting strategies Person educated:  Patient Education method: Explanation and Verbal cues Education comprehension: verbalized understanding, returned demonstration, verbal cues required, and needs further education  HOME EXERCISE PROGRAM: -Left gross grip strength, as well as lateral, and 3pt. Pinch strengthening with green level resistive theraputty -Handwriting   GOALS: Goals reviewed with patient? YES  SHORT TERM GOALS: Target date: 11/24/2023   Pt. will be independent with HEPs for BUE strength, and [facility] skills. Baseline: Eval: No current HEP Goal status: INITIAL  LONG TERM GOALS: Target date: 01/05/2024  Pt. Will improve Left [facility] skills by  5 sec. of speed in order to be able to manipulate small objects.  Baseline: Eval: Right: 31 sec. Left: 45 sec. Goal status: INITIAL  2.  Pt. Will increase Left UE strength by 2 mm grades to assist with ADLs, and IADLs. Baseline:Eval:  Right: shoulder flexion: 4-/5, abduction: 4-/5, elbow flexion: 4-/5, elbow extension: 4-/5, wrist extension: 4-/5 Goal status: INITIAL  3. Pt. Will increase  Left grip strength by  5# to be able to hold ADL items securely in his hand  Baseline: Eval: Right: 63#, Left: 59# Goal status: INITIAL  4.  Pt. Will increase Left pinch strength by 3# to be able to efficiently cut food. Baseline: Eval: Lateral pinch: Right: 12 lbs, Left: 10 lbs, and 3 point pinch: Right: 12 lbs, Left: 8 lbs Goal status: INITIAL  5.  Pt. Will be  able to button a shirt efficiently with modified independence. Baseline: Eval: Pt. has difficulty manipulating buttons Goal status: INITIAL  6.  Pt. Will independently, and efficiently complete golf tournament score cards with 75% legibility. Baseline: Eval: 50% legibility for name only. 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  Simulated writing on a score card today, with legibility initially 75% with increased letter height (~4 lines high on paper), progressing to 1 line letter height with 90% legibility after cues for positioning, slowing pace, and high reps of writing names and numbers throughout session.  Pt preferred standard pen with rubber pen grip for improved prehension of pen.  Pt encouraged to continue this practice at home on lined paper and his actual score cards, and encouraged pt to bring in score cards next session to practice without simulating designated spaces on paper.  Pt agreed.  Pt will continue to benefit from OT services to work on improving LUE strength, and left hand function skills in order to work towards improving, and maximizing overall independence with ADLs, and IADL tasks.   PERFORMANCE  DEFICITS: in functional skills including ADLs, IADLs, coordination, dexterity, proprioception, sensation, ROM, strength, Fine motor control, Gross motor control, mobility, decreased knowledge of precautions, decreased knowledge of use of DME, and UE functional use, cognitive skills including, and psychosocial skills including coping strategies, environmental adaptation, interpersonal interactions, and routines and behaviors.   IMPAIRMENTS: are limiting patient from ADLs, IADLs, and leisure.   CO-MORBIDITIES: may have co-morbidities  that affects occupational performance. Patient will benefit from skilled OT to address above impairments and improve overall function.  MODIFICATION OR ASSISTANCE TO COMPLETE EVALUATION: Min-Moderate modification of tasks or assist with assess necessary to complete an evaluation.  OT OCCUPATIONAL PROFILE AND HISTORY: Detailed assessment: Review of records and additional review of physical, cognitive, psychosocial history related to current functional performance.  CLINICAL DECISION MAKING: Moderate - several treatment options,  min-mod task modification necessary  REHAB POTENTIAL: Good  EVALUATION COMPLEXITY: Moderate    PLAN:  OT FREQUENCY: 2x/week  OT DURATION: 12 weeks  PLANNED INTERVENTIONS: 97535 self care/ADL training, 02889 therapeutic exercise, 97530 therapeutic activity, 97112 neuromuscular re-education, 97140 manual therapy, 97018 paraffin, 02989 moist heat, 97034 contrast bath, balance training, functional mobility training, energy conservation, coping strategies training, patient/family education, and DME and/or AE instructions  RECOMMENDED OTHER SERVICES: PT  CONSULTED AND AGREED WITH PLAN OF CARE: Patient  PLAN FOR NEXT SESSION: Treatment  Inocente Blazing, MS, OTR/L  10/25/2023, 7:33 PM

## 2023-10-27 ENCOUNTER — Encounter: Admitting: Occupational Therapy

## 2023-10-27 ENCOUNTER — Ambulatory Visit: Admitting: Physical Therapy

## 2023-10-29 ENCOUNTER — Ambulatory Visit: Admitting: Physical Therapy

## 2023-10-29 ENCOUNTER — Ambulatory Visit: Admitting: Occupational Therapy

## 2023-10-29 DIAGNOSIS — R2681 Unsteadiness on feet: Secondary | ICD-10-CM | POA: Diagnosis not present

## 2023-10-29 DIAGNOSIS — M6281 Muscle weakness (generalized): Secondary | ICD-10-CM

## 2023-10-29 DIAGNOSIS — R278 Other lack of coordination: Secondary | ICD-10-CM

## 2023-10-29 NOTE — Therapy (Signed)
 OUTPATIENT OCCUPATIONAL THERAPY NEURO TREATMENT NOTE  Patient Name: George Bruce MRN: [id number] DOB:1945/08/21, 78 y.o., male Today's Date: 10/29/2023  PCP: Dr. Auston, MD REFERRING PROVIDER: Dr. 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   Hypertension    Wears dentures    partial upper and lower   Past Surgical History:  Procedure Laterality Date   CATARACT EXTRACTION W/PHACO Right 09/29/2022   Procedure: CATARACT EXTRACTION PHACO AND INTRAOCULAR LENS PLACEMENT (IOC) RIGHT  10.98  00:56.5;  Surgeon: Myrna Adine Anes, MD;  Location: [facility];  Service: Ophthalmology;  Laterality: Right;   CATARACT EXTRACTION W/PHACO Left 10/13/2022   Procedure: CATARACT EXTRACTION PHACO AND INTRAOCULAR LENS PLACEMENT (IOC) LEFT 10.79 00:54.1;  Surgeon: Myrna Adine Anes, MD;  Location: [facility];  Service: Ophthalmology;  Laterality: Left;   COLONOSCOPY WITH PROPOFOL  N/A 04/21/2018   Procedure: COLONOSCOPY WITH PROPOFOL ;  Surgeon: Janalyn Keene NOVAK, MD;  Location: [facility];  Service: Endoscopy;  Laterality: N/A;   INGUINAL HERNIA REPAIR Left 1990's   INGUINAL HERNIA REPAIR Right 2015   REVERSE SHOULDER ARTHROPLASTY Left 11/06/2015   Procedure: REVERSE SHOULDER ARTHROPLASTY;  Surgeon: Norleen JINNY Maltos, MD;  Location: [facility];  Service: Orthopedics;  Laterality: Left;   REVERSE SHOULDER  ARTHROPLASTY Right 03/25/2022   Procedure: REVERSE SHOULDER ARTHROPLASTY;  Surgeon: Maltos Norleen JINNY, MD;  Location: [facility];  Service: Orthopedics;  Laterality: Right;  MAKE 2ND CASE   Patient Active Problem List   Diagnosis Date Noted   Acute CVA (cerebrovascular accident) (HCC) 07/21/2023   Elevated blood pressure reading 07/21/2023   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   Acute diverticulitis 05/07/2018   History of colonic polyps    Benign neoplasm of ascending colon    Diverticulosis of large intestine without diverticulitis    Erectile dysfunction due to arterial insufficiency 02/18/2018   Degenerative disc disease, lumbar 08/26/2016   Bilateral hip pain 06/24/2016   Chronic midline low back pain 06/24/2016   Polyarthralgia 06/24/2016   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   Hematospermia 05/03/2015   Colon polyps 08/16/2013   Hematuria 08/16/2013   Hyperlipidemia 08/16/2013   Incomplete emptying of bladder 06/23/2013   Benign prostatic hyperplasia with incomplete bladder emptying 11/11/2011   Elevated PSA 11/11/2011   Reduced libido 11/11/2011   ONSET DATE: 07/21/23  REFERRING DIAG: Acute Ischemic CVA  THERAPY DIAG:  Muscle weakness (generalized)  Other lack of coordination  Rationale for Evaluation and Treatment: Rehabilitation  SUBJECTIVE:  SUBJECTIVE STATEMENT: Pt reports that 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  Pt accompanied by: self  PERTINENT HISTORY: Pt. was admitted to the hospital from 5/13-5/14/25 with an Acute Ischemic Large Vessel RPCA Occlusion. PMHx includes: HTN, HLD, and BPH.  PRECAUTIONS: None, fall  WEIGHT BEARING RESTRICTIONS: No  PAIN:  Are you having pain? no  FALLS: Has  patient fallen in last 6 months? No  LIVING ENVIRONMENT: Lives with: lives with their spouse Lives in: House/apartment-I level Stairs: 4 steps in to the house with rail on the right Has following equipment at home: Single point cane and Walker - 2 wheeled  PLOF: Independent  PATIENT GOALS: To get back into yard work, and golfing  OBJECTIVE:  Note: Objective measures were completed at Evaluation unless otherwise noted.  HAND DOMINANCE: Left  ADLs:  Eating:  Difficulty with motor control-holding utensils with the Left hand Grooming: Difficulty with motor control holding the toothbrush with the Left hand. Starts out brushing his hair with the left hand then switches to the right hand. Shaves with the right hand. UB Dressing: Independent with pull over shirts. Difficulty buttoning LB Dressing:  Difficulty reaching around to place belt through the loops. Toileting: Independent Bathing: Independent  Tub Shower transfers:  uses bath mat  IADLs: Shopping: Independent reaching for items, Independent with the transaction efficient. Light housekeeping: cleans bathroom, wash dishes-does not put dishes away in cabinet making bed  Meal Prep:  Independent  light meal prep, cuts tomatoes Community mobility: Driving short distances Medication management: Wife lines them up. Difficulty grasping them with the left hand. Financial management: Is not able to write checks. Handwriting: 50% legible in cursive form. Work history: retired Midwife, yard work, visit with friends at [employer], and [employer]  MOBILITY STATUS: Modified Independent-balance issues.  ACTIVITY TOLERANCE: Activity tolerance:  Fair  FUNCTIONAL OUTCOME MEASURES: MAM-20 Sum score: 51/80  UPPER EXTREMITY ROM:    Active ROM Right eval Left eval  Shoulder flexion 132(150) 120(132)  Shoulder abduction 120(130) 110(116)  Shoulder adduction    Shoulder extension    Shoulder internal rotation     Shoulder external rotation    Elbow flexion [facility] WFL  Elbow extension [facility]  Wrist flexion [facility] WFL  Wrist extension [facility] WFL  Wrist ulnar deviation    Wrist radial deviation    Wrist pronation WFL WFL  Wrist supination WFL WFL  (Blank rows = not tested)  UPPER EXTREMITY MMT:     MMT Right Eval WFL Left Eval 4-/5  Shoulder flexion    Shoulder abduction    Shoulder adduction    Shoulder extension    Shoulder internal rotation    Shoulder external rotation    Middle trapezius    Lower trapezius    Elbow flexion    Elbow extension    Wrist flexion    Wrist extension    Wrist ulnar deviation    Wrist radial deviation    Wrist pronation    Wrist supination    (Blank rows = not tested)  HAND FUNCTION: Grip strength: Right: 63 lbs; Left: 59 lbs, Lateral pinch: Right: 12 lbs, Left: 10 lbs, and 3 point pinch: Right: 12 lbs, Left: 8 lbs  COORDINATION: 9 Hole Peg test: Right: 31 sec; Left: 45 sec  SENSATION: Light touch: WFL Proprioception: WFL  EDEMA:  N/A  COGNITION: Overall cognitive status: Within functional limits for tasks assessed  VISION:  No change from baseline  PERCEPTION: WFL  PRAXIS: Impaired motor control  TREATMENT DATE: 10/29/23  Self-care:  -Pt. worked on the IADL of writing with focus on writing word lists, and sentences legibly in small spaces with progression from larger to smaller designated spaces to promote more controlled writing. -Assessed Pt. writing legibility using different writing tools including: a standard pencil to complete 2 sentences(-one sentence completed in 1 min. & 48 sec.) on unlined paper with 50% legibility and deviation above the line. Pt. reported 8/10 shooting electrical pain in the left hand with pencil use. Pt. then attempted to use a pen with a tripod pen grip in place. Pt. was able to complete one sentence on unlined paper with 60% legibility with deviation above the line. -Assessed writing legibility for writing  word lists on 1.5 lines spaces with 65% legibility with each word kept straight, however above the line. Pt. then progressed to formulating word lists on 1/4 lined spaces with 75% legibility with no deviation from the line.  Manual Therapy:  -Soft tissue massage was performed to the volar surface of the left hand, the volar aspect of the MPs, thenar eminence, and thumb to increase circulation, and decrease pain, and stiffness.  -Soft tissue mobilizations were performed for left carpal, and metacarpal spread stretches.  -Manual therapy was performed independent of, and in preparation for ROM, and therapeutic ex.    PATIENT EDUCATION: Education details: Handwriting strategies Person educated: Patient Education method: Explanation and Verbal cues Education comprehension: verbalized understanding, returned demonstration, verbal cues required, and needs further education  HOME EXERCISE PROGRAM: -Left gross grip strength, as well as lateral, and 3pt. Pinch strengthening with green level resistive theraputty -Handwriting   GOALS: Goals reviewed with patient? YES  SHORT TERM GOALS: Target date: 11/24/2023   Pt. will be independent with HEPs for BUE strength, and [facility] skills. Baseline: Eval: No current HEP Goal status: INITIAL  LONG TERM GOALS: Target date: 01/05/2024  Pt. Will improve Left [facility] skills by  5 sec. of speed in order to be able to manipulate small objects.  Baseline: Eval: Right: 31 sec. Left: 45 sec. Goal status: INITIAL  2.  Pt. Will increase Left UE strength by 2 mm grades to assist with ADLs, and IADLs. Baseline:Eval: Right: WFL; Left shoulder flexion: 4-/5, abduction: 4-/5, elbow flexion: 4-/5, elbow extension: 4-/5, wrist extension: 4-/5 Goal status: INITIAL  3. Pt. Will increase  Left grip strength by  5# to be able to hold ADL items securely in his hand  Baseline: Eval: Right: 63#, Left: 59# Goal status: INITIAL  4.  Pt. Will increase Left pinch strength by 3# to  be able to efficiently cut food. Baseline: Eval: Lateral pinch: Right: 12 lbs, Left: 10 lbs, and 3 point pinch: Right: 12 lbs, Left: 8 lbs Goal status: INITIAL  5.  Pt. Will be  able to button a shirt efficiently with modified independence. Baseline: Eval: Pt. has difficulty manipulating buttons Goal status: INITIAL  6.  Pt. Will independently, and efficiently complete golf tournament score cards with 75% legibility. Baseline: Eval: 50% legibility for name only. Goal status: INITIAL  ASSESSMENT:  CLINICAL IMPRESSION:  Pt. Responded well to manual therapy with less pain in the left hand following treatment.  Pt. Reports 8/10 shooting electrical pain in the left hand when using a pencil. Pt. presented with improved writing legibility when using a pen, however speed was slower. Pt. was able to write in 1/2, and 1.4 designated lined spaces with improved legibility noted on the 1/4  lined space. Pt. presented with no deviation fr the line in the smaller space. 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 Pt.  continues to benefit from OT services to work on improving LUE strength, and left hand function skills in order to work towards improving, and maximizing overall independence with ADLs, and IADL tasks.   PERFORMANCE DEFICITS: in functional skills including ADLs, IADLs, coordination, dexterity, proprioception, sensation, ROM, strength, Fine motor control, Gross motor control, mobility, decreased knowledge of precautions, decreased knowledge of use of DME, and UE functional use, cognitive skills including, and psychosocial skills including coping strategies, environmental adaptation, interpersonal interactions, and routines and behaviors.   IMPAIRMENTS: are limiting patient from ADLs, IADLs, and leisure.   CO-MORBIDITIES: may have co-morbidities  that affects occupational performance. Patient will benefit from skilled OT to address above  impairments and improve overall function.  MODIFICATION OR ASSISTANCE TO COMPLETE EVALUATION: Min-Moderate modification of tasks or assist with assess necessary to complete an evaluation.  OT OCCUPATIONAL PROFILE AND HISTORY: Detailed assessment: Review of records and additional review of physical, cognitive, psychosocial history related to current functional performance.  CLINICAL DECISION MAKING: Moderate - several treatment options, min-mod task modification necessary  REHAB POTENTIAL: Good  EVALUATION COMPLEXITY: Moderate    PLAN:  OT FREQUENCY: 2x/week  OT DURATION: 12 weeks  PLANNED INTERVENTIONS: 97535 self care/ADL training, 02889 therapeutic exercise, 97530 therapeutic activity, 97112 neuromuscular re-education, 97140 manual therapy, 97018 paraffin, 02989 moist heat, 97034 contrast bath, balance training, functional mobility training, energy conservation, coping strategies training, patient/family education, and DME and/or AE instructions  RECOMMENDED OTHER SERVICES: PT  CONSULTED AND AGREED WITH PLAN OF CARE: Patient  PLAN FOR NEXT SESSION: Treatment  Richardson Otter, MS, OTR/L   10/29/2023, 3:51 PM

## 2023-11-03 ENCOUNTER — Ambulatory Visit: Admitting: Occupational Therapy

## 2023-11-03 ENCOUNTER — Ambulatory Visit: Admitting: Physical Therapy

## 2023-11-03 DIAGNOSIS — R2681 Unsteadiness on feet: Secondary | ICD-10-CM | POA: Diagnosis not present

## 2023-11-03 DIAGNOSIS — M6281 Muscle weakness (generalized): Secondary | ICD-10-CM

## 2023-11-03 NOTE — Therapy (Addendum)
 OUTPATIENT OCCUPATIONAL THERAPY NEURO TREATMENT NOTE  Patient Name: George Bruce MRN: [id number] DOB:07-20-1945, 78 y.o., male Today's Date: 11/03/2023  PCP: Dr. Auston, MD REFERRING PROVIDER: Dr. 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   Hypertension    Wears dentures    partial upper and lower   Past Surgical History:  Procedure Laterality Date   CATARACT EXTRACTION W/PHACO Right 09/29/2022   Procedure: CATARACT EXTRACTION PHACO AND INTRAOCULAR LENS PLACEMENT (IOC) RIGHT  10.98  00:56.5;  Surgeon: Myrna Adine Anes, MD;  Location: [facility];  Service: Ophthalmology;  Laterality: Right;   CATARACT EXTRACTION W/PHACO Left 10/13/2022   Procedure: CATARACT EXTRACTION PHACO AND INTRAOCULAR LENS PLACEMENT (IOC) LEFT 10.79 00:54.1;  Surgeon: Myrna Adine Anes, MD;  Location: [facility];  Service: Ophthalmology;  Laterality: Left;   COLONOSCOPY WITH PROPOFOL  N/A 04/21/2018   Procedure: COLONOSCOPY WITH PROPOFOL ;  Surgeon: Janalyn Keene NOVAK, MD;  Location: [facility];  Service: Endoscopy;  Laterality: N/A;   INGUINAL HERNIA REPAIR Left 1990's   INGUINAL HERNIA REPAIR Right 2015   REVERSE SHOULDER ARTHROPLASTY Left 11/06/2015   Procedure: REVERSE SHOULDER ARTHROPLASTY;  Surgeon: Norleen JINNY Maltos, MD;  Location: [facility];  Service: Orthopedics;  Laterality: Left;   REVERSE SHOULDER  ARTHROPLASTY Right 03/25/2022   Procedure: REVERSE SHOULDER ARTHROPLASTY;  Surgeon: Maltos Norleen JINNY, MD;  Location: [facility];  Service: Orthopedics;  Laterality: Right;  MAKE 2ND CASE   Patient Active Problem List   Diagnosis Date Noted   Acute CVA (cerebrovascular accident) (HCC) 07/21/2023   Elevated blood pressure reading 07/21/2023   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   Acute diverticulitis 05/07/2018   History of colonic polyps    Benign neoplasm of ascending colon    Diverticulosis of large intestine without diverticulitis    Erectile dysfunction due to arterial insufficiency 02/18/2018   Degenerative disc disease, lumbar 08/26/2016   Bilateral hip pain 06/24/2016   Chronic midline low back pain 06/24/2016   Polyarthralgia 06/24/2016   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 Reports that he woke up feeling a bit weaker this morning. Pt accompanied by: self  PERTINENT HISTORY: Pt. was admitted to the hospital from  5/13-5/14/25 with an Acute Ischemic Large Vessel RPCA Occlusion. PMHx includes: HTN, HLD, and BPH.  PRECAUTIONS: None, fall  WEIGHT BEARING RESTRICTIONS: No  PAIN:  Are you having pain? no  FALLS: Has patient fallen in last 6 months? No  LIVING ENVIRONMENT: Lives with: lives with their spouse Lives in: House/apartment-I level Stairs: 4 steps in to the house with rail on the right Has following  equipment at home: Single point cane and Walker - 2 wheeled  PLOF: Independent  PATIENT GOALS: To get back into yard work, and golfing  OBJECTIVE:  Note: Objective measures were completed at Evaluation unless otherwise noted.  HAND DOMINANCE: Left  ADLs:  Eating:  Difficulty with motor control-holding utensils with the Left hand Grooming: Difficulty with motor control holding the toothbrush with the Left hand. Starts out brushing his hair with the left hand then switches to the right hand. Shaves with the right hand. UB Dressing: Independent with pull over shirts. Difficulty buttoning LB Dressing:  Difficulty reaching around to place belt through the loops. Toileting: Independent Bathing: Independent  Tub Shower transfers:  uses bath mat  IADLs: Shopping: Independent reaching for items, Independent with the transaction efficient. Light housekeeping: cleans bathroom, wash dishes-does not put dishes away in cabinet making bed  Meal Prep:  Independent  light meal prep, cuts tomatoes Community mobility: Driving short distances Medication management: Wife lines them up. Difficulty grasping them with the left hand. Financial management: Is not able to write checks. Handwriting: 50% legible in cursive form. 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  MOBILITY STATUS: Modified Independent-balance issues.  ACTIVITY TOLERANCE: Activity tolerance:  Fair  FUNCTIONAL OUTCOME MEASURES: MAM-20 Sum score: 51/80  UPPER EXTREMITY ROM:    Active ROM Right eval Left eval  Shoulder flexion 132(150) 120(132)  Shoulder abduction 120(130) 110(116)  Shoulder adduction    Shoulder extension    Shoulder internal rotation    Shoulder external rotation    Elbow flexion [facility] WFL  Elbow extension [facility]  Wrist flexion [facility] WFL  Wrist extension [facility] WFL  Wrist ulnar deviation    Wrist radial deviation    Wrist pronation WFL  WFL  Wrist supination WFL WFL  (Blank rows = not tested)  UPPER EXTREMITY MMT:     MMT Right Eval WFL Left Eval 4-/5  Shoulder flexion    Shoulder abduction    Shoulder adduction    Shoulder extension    Shoulder internal rotation    Shoulder external rotation    Middle trapezius    Lower trapezius    Elbow flexion    Elbow extension    Wrist flexion    Wrist extension    Wrist ulnar deviation    Wrist radial deviation    Wrist pronation    Wrist supination    (Blank rows = not tested)  HAND FUNCTION: Grip strength: Right: 63 lbs; Left: 59 lbs, Lateral pinch: Right: 12 lbs, Left: 10 lbs, and 3 point pinch: Right: 12 lbs, Left: 8 lbs  COORDINATION: 9 Hole Peg test: Right: 31 sec; Left: 45 sec  SENSATION: Light touch: WFL Proprioception: WFL  EDEMA:  N/A  COGNITION: Overall cognitive status: Within functional limits for tasks assessed  VISION:  No change from baseline  PERCEPTION: WFL  PRAXIS: Impaired motor control  TREATMENT DATE: 11/03/23  Self-care:  -worked on the IADL of writing with focus on legibly and accurately completing golf score cards.  -assessed  legibly writing names, and number score in progressively smaller designated slots.  -challenging writing on both a flat surface, and at a vertical angle to simulate filling out a score card secured to a golf cart steering wheel. -Assessed pencil grasp with coban applied to the lower half of the pencil to simulate a shorter pencil typically used during golf. -Assessed writing on a golf score card in both unfolded, and trifolded form.   PATIENT EDUCATION: Education details: Handwriting strategies Person educated: Patient Education method: Explanation and Verbal cues Education comprehension: verbalized understanding, returned demonstration, verbal cues required, and needs further education  HOME EXERCISE PROGRAM: -Left gross grip strength, as well as lateral, and 3pt. Pinch strengthening with green  level resistive theraputty -Handwriting   GOALS: Goals reviewed with patient? YES  SHORT TERM GOALS: Target date: 11/24/2023   Pt. will be independent with HEPs for BUE strength, and [facility] skills. Baseline: Eval: No current HEP Goal status: INITIAL  LONG TERM GOALS: Target date: 01/05/2024  Pt. Will improve Left [facility] skills by  5 sec. of speed in order to be able to manipulate small objects.  Baseline: Eval: Right: 31 sec. Left: 45 sec. Goal status: INITIAL  2.  Pt. Will increase Left UE strength by 2 mm grades to assist with ADLs, and IADLs. Baseline:Eval: Right: WFL; Left shoulder flexion: 4-/5, abduction: 4-/5, elbow flexion: 4-/5, elbow extension: 4-/5, wrist extension: 4-/5 Goal status: INITIAL  3. Pt. Will increase  Left grip strength by  5# to be able to hold ADL items securely in his hand  Baseline: Eval: Right: 63#, Left: 59# Goal status: INITIAL  4.  Pt. Will increase Left pinch strength by 3# to be able to efficiently cut food. Baseline: Eval: Lateral pinch: Right: 12 lbs, Left: 10 lbs, and 3 point pinch: Right: 12 lbs, Left: 8 lbs Goal status: INITIAL  5.  Pt. Will be  able to button a shirt efficiently with modified independence. Baseline: Eval: Pt. has difficulty manipulating buttons Goal status: INITIAL  6.  Pt. Will independently, and efficiently complete golf tournament score cards with 75% legibility. Baseline: Eval: 50% legibility for name only. Goal status: INITIAL  ASSESSMENT:  CLINICAL IMPRESSION:  Pt. was able to complete golf score cards with 75% legibility for the names, and 100% legibility with the the numbers on score cards with both larger, and smaller boxes. Pt. was able to Pt. was able to stay within the designated boxes when completing shorter names, however had difficulty staying in the boxes for longer names. Pt. presented with more difficulty writing with the score placed on a vertical angle, as the LUE fatigues. Pt. continues to benefit from OT  services to work on improving LUE strength, and left hand function skills in order to work towards improving, and maximizing overall independence with ADLs, and IADL tasks.   PERFORMANCE DEFICITS: in functional skills including ADLs, IADLs, coordination, dexterity, proprioception, sensation, ROM, strength, Fine motor control, Gross motor control, mobility, decreased knowledge of precautions, decreased knowledge of use of DME, and UE functional use, cognitive skills including, and psychosocial skills including coping strategies, environmental adaptation, interpersonal interactions, and routines and behaviors.   IMPAIRMENTS: are limiting patient from ADLs, IADLs, and leisure.   CO-MORBIDITIES: may have co-morbidities  that affects occupational performance. Patient will benefit from skilled OT to address above impairments and improve overall function.  MODIFICATION OR ASSISTANCE TO COMPLETE EVALUATION: Min-Moderate modification of tasks or assist with assess necessary to complete an evaluation.  OT OCCUPATIONAL PROFILE AND  HISTORY: Detailed assessment: Review of records and additional review of physical, cognitive, psychosocial history related to current functional performance.  CLINICAL DECISION MAKING: Moderate - several treatment options, min-mod task modification necessary  REHAB POTENTIAL: Good  EVALUATION COMPLEXITY: Moderate    PLAN:  OT FREQUENCY: 2x/week  OT DURATION: 12 weeks  PLANNED INTERVENTIONS: 97535 self care/ADL training, 02889 therapeutic exercise, 97530 therapeutic activity, 97112 neuromuscular re-education, 97140 manual therapy, 97018 paraffin, 02989 moist heat, 97034 contrast bath, balance training, functional mobility training, energy conservation, coping strategies training, patient/family education, and DME and/or AE instructions  RECOMMENDED OTHER SERVICES: PT  CONSULTED AND AGREED WITH PLAN OF CARE: Patient  PLAN FOR NEXT SESSION: Treatment  Sweet Jarvis,  MS, OTR/L   11/03/2023, 10:01 AM

## 2023-11-04 ENCOUNTER — Ambulatory Visit: Admitting: Physical Therapy

## 2023-11-05 ENCOUNTER — Ambulatory Visit: Admitting: Physical Therapy

## 2023-11-05 ENCOUNTER — Ambulatory Visit

## 2023-11-05 DIAGNOSIS — R278 Other lack of coordination: Secondary | ICD-10-CM

## 2023-11-05 DIAGNOSIS — M6281 Muscle weakness (generalized): Secondary | ICD-10-CM

## 2023-11-05 DIAGNOSIS — R2681 Unsteadiness on feet: Secondary | ICD-10-CM | POA: Diagnosis not present

## 2023-11-05 DIAGNOSIS — I69352 Hemiplegia and hemiparesis following cerebral infarction affecting left dominant side: Secondary | ICD-10-CM

## 2023-11-05 NOTE — Therapy (Signed)
 OUTPATIENT OCCUPATIONAL THERAPY NEURO DISCHARGE NOTE  Patient Name: George Bruce MRN: [id number] DOB:08-May-1945, 78 y.o., male Today's Date: 11/07/2023  PCP: Dr. Auston, MD REFERRING PROVIDER: Dr. 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   Hypertension    Wears dentures    partial upper and lower   Past Surgical History:  Procedure Laterality Date   CATARACT EXTRACTION W/PHACO Right 09/29/2022   Procedure: CATARACT EXTRACTION PHACO AND INTRAOCULAR LENS PLACEMENT (IOC) RIGHT  10.98  00:56.5;  Surgeon: Myrna Adine Anes, MD;  Location: [facility];  Service: Ophthalmology;  Laterality: Right;   CATARACT EXTRACTION W/PHACO Left 10/13/2022   Procedure: CATARACT EXTRACTION PHACO AND INTRAOCULAR LENS PLACEMENT (IOC) LEFT 10.79 00:54.1;  Surgeon: Myrna Adine Anes, MD;  Location: [facility];  Service: Ophthalmology;  Laterality: Left;   COLONOSCOPY WITH PROPOFOL  N/A 04/21/2018   Procedure: COLONOSCOPY WITH PROPOFOL ;  Surgeon: Janalyn Keene NOVAK, MD;  Location: [facility];  Service: Endoscopy;  Laterality: N/A;   INGUINAL HERNIA REPAIR Left 1990's   INGUINAL HERNIA REPAIR Right 2015   REVERSE SHOULDER ARTHROPLASTY Left 11/06/2015   Procedure: REVERSE SHOULDER ARTHROPLASTY;  Surgeon: Norleen JINNY Maltos, MD;  Location: [facility] ORS;  Service: Orthopedics;  Laterality: Left;   REVERSE SHOULDER ARTHROPLASTY  Right 03/25/2022   Procedure: REVERSE SHOULDER ARTHROPLASTY;  Surgeon: Maltos Norleen JINNY, MD;  Location: [facility] ORS;  Service: Orthopedics;  Laterality: Right;  MAKE 2ND CASE   Patient Active Problem List   Diagnosis Date Noted   Acute CVA (cerebrovascular accident) (HCC) 07/21/2023   Elevated blood pressure reading 07/21/2023   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   Acute diverticulitis 05/07/2018   History of colonic polyps    Benign neoplasm of ascending colon    Diverticulosis of large intestine without diverticulitis    Erectile dysfunction due to arterial insufficiency 02/18/2018   Degenerative disc disease, lumbar 08/26/2016   Bilateral hip pain 06/24/2016   Chronic midline low back pain 06/24/2016   Polyarthralgia 06/24/2016   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   Hematospermia 05/03/2015   Colon polyps 08/16/2013   Hematuria 08/16/2013   Hyperlipidemia 08/16/2013   Incomplete emptying of bladder 06/23/2013   Benign prostatic hyperplasia with incomplete bladder emptying 11/11/2011   Elevated PSA 11/11/2011   Reduced libido 11/11/2011   ONSET DATE: 07/21/23  REFERRING DIAG: Acute Ischemic CVA  THERAPY DIAG:  Muscle weakness (generalized)  Other lack of coordination  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 Pt accompanied by: self  PERTINENT HISTORY:  Pt. was admitted to the hospital from 5/13-5/14/25 with an Acute Ischemic Large Vessel RPCA Occlusion. PMHx includes: HTN, HLD, and BPH.  PRECAUTIONS: None, fall  WEIGHT BEARING RESTRICTIONS: No  PAIN:  Are you having pain? no  FALLS: Has patient fallen in last 6 months? No  LIVING ENVIRONMENT: Lives with: lives with their spouse Lives in:  House/apartment-I level Stairs: 4 steps in to the house with rail on the right Has following equipment at home: Single point cane and Walker - 2 wheeled  PLOF: Independent  PATIENT GOALS: To get back into yard work, and golfing  OBJECTIVE:  Note: Objective measures were completed at Evaluation unless otherwise noted.  HAND DOMINANCE: Left  ADLs:  Eating:  Difficulty with motor control-holding utensils with the Left hand Grooming: Difficulty with motor control holding the toothbrush with the Left hand. Starts out brushing his hair with the left hand then switches to the right hand. Shaves with the right hand. UB Dressing: Independent with pull over shirts. Difficulty buttoning LB Dressing:  Difficulty reaching around to place belt through the loops. Toileting: Independent Bathing: Independent  Tub Shower transfers:  uses bath mat  IADLs: Shopping: Independent reaching for items, Independent with the transaction efficient. Light housekeeping: cleans bathroom, wash dishes-does not put dishes away in cabinet making bed  Meal Prep:  Independent  light meal prep, cuts tomatoes Community mobility: Driving short distances Medication management: Wife lines them up. Difficulty grasping them with the left hand. Financial management: Is not able to write checks. Handwriting: 50% legible in cursive form. 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  ACTIVITY TOLERANCE: Activity tolerance:  Fair  FUNCTIONAL OUTCOME MEASURES: MAM-20 Sum score: 51/80 11/03/23: 72/80  UPPER EXTREMITY ROM:    Active ROM Right eval Right 11/05/23 Left eval Left 11/05/23  Shoulder flexion 132(150) 140  120(132) 140  Shoulder abduction 120(130) 158 110(116) 160 (with slight scaption)   Shoulder adduction      Shoulder extension      Shoulder internal rotation      Shoulder external  rotation      Elbow flexion [facility]  WFL   Elbow extension [facility]   Wrist flexion [facility]  WFL   Wrist extension [facility]  WFL   Wrist ulnar deviation      Wrist radial deviation      Wrist pronation [facility]  WFL   Wrist supination WFL  WFL   (Blank rows = not tested)  UPPER EXTREMITY MMT:     MMT Right Eval [facility] Right 11/05/23 Left Eval 4-/5 Left 11/05/23  Shoulder flexion  4+  4-  Shoulder abduction  4+  4-  Shoulder adduction      Shoulder extension      Shoulder internal rotation  5  4  Shoulder external rotation  4  4  Middle trapezius      Lower trapezius      Elbow flexion  5  5  Elbow extension  5  5  Wrist flexion  5  5  Wrist extension  5  5  Wrist ulnar deviation      Wrist radial deviation      Wrist pronation      Wrist supination      (Blank rows = not tested)  HAND FUNCTION: Grip strength: Right: 63 lbs; Left: 59 lbs, Lateral pinch: Right: 12 lbs, Left: 10 lbs, and 3  point pinch: Right: 12 lbs, Left: 8 lbs Grip strength: Right: 75 lbs;  Left: 61 lbs; Lateral pinch: Right 14 lbs, Left: 17 lbs, and 3 point pinch: Right: 13 lbs, Left: 13 lbs   COORDINATION: 9 Hole Peg test: Right: 31 sec; Left: 45 sec 9 hole Peg test: Left: 39 sec   SENSATION: Light touch: WFL Proprioception: WFL  EDEMA:  N/A  COGNITION: Overall cognitive status: Within functional limits for tasks assessed  VISION:  No change from baseline  PERCEPTION: WFL  PRAXIS: Impaired motor control  TREATMENT DATE: 11/05/23 Therapeutic Activity: -Objective measures taken and goals updated for discharge summary.  Self-care: -HEP reviewed  PATIENT EDUCATION: Education details: progress towards goals/HEP review Person educated: Patient Education method: Explanation and Verbal cues Education comprehension: verbalized understanding  HOME EXERCISE PROGRAM: -Left gross grip strength, as well as lateral, and 3pt. Pinch strengthening with green level resistive theraputty -Handwriting    GOALS: Goals reviewed with patient? YES  SHORT TERM GOALS: Target date: 11/24/2023   Pt. will be independent with HEPs for BUE strength, and [facility] skills. Baseline: Eval: No current HEP; 11/05/23: indep Goal status: achieved  LONG TERM GOALS: Target date: 01/05/2024  Pt. Will improve Left [facility] skills by  5 sec. of speed in order to be able to manipulate small objects.  Baseline: Eval: Right: 31 sec. Left: 45 sec; 11/05/23: Left: 39 sec Goal status: achieved  2.  Pt. Will increase Left UE strength by 2 mm grades to assist with ADLs, and IADLs. Baseline:Eval: Right: WFL; Left shoulder flexion: 4-/5, abduction: 4-/5, elbow flexion: 4-/5, elbow extension: 4-/5, wrist extension: 4-/5; 11/05/23: L shoulder remains 4-/5 (though weak at baseline from previous shoulder surgery, and elbow, wrist measures have improved by 1/2 grade or more) Goal status: improved/partially achieved; d/c   3. Pt. Will increase  Left grip strength by  5# to be able to hold ADL items securely in his hand  Baseline: Eval: Right: 63#, Left: 59#; 11/05/23: Left: 61 lbs Goal status: improved/partially achieved; d/c  4.  Pt. Will increase Left pinch strength by 3# to be able to efficiently cut food. Baseline: Eval: Lateral pinch: Right: 12 lbs, Left: 10 lbs, and 3 point pinch: Right: 12 lbs, Left: 8 lbs; 11/05/23: Left: lateral pinch: 17 lbs, 3 point pinch: 13 lbs Goal status: achieved   5.  Pt. Will be able to button a shirt efficiently with modified independence. Baseline: Eval: Pt. has difficulty manipulating buttons; 11/05/23: indep without AE Goal status: achieved  6.  Pt. Will independently, and efficiently complete golf tournament score cards with 75% legibility. Baseline: Eval: 50% legibility for name only; 11/05/23: 75% legibility to write names on score cards, and 100% legibility to write numbers within designated score boxes Goal status: achieved  ASSESSMENT:  CLINICAL IMPRESSION: Pt seen this date for OT d/c.   Pt has completed 7 OT sessions to target LUE strengthening and coordination.  Pt has demonstrated increases in LUE strength at the shoulder internal and external rotators, and at the elbow, wrist, and hand.  L shoulder flex and abd remain about 4-/5, though anticipate this to be baseline strength d/t hx of L shoulder surgery.  Pt is now indep with his HEP and writing strategies, and is now filling out golf score cards with improved legibility (see goal updates above).  Pt feels confident to d/c today with recommendations to continue with L hand strengthening HEP and handwriting practice.  Pt in agreement with plan.    PERFORMANCE DEFICITS:  in functional skills including ADLs, IADLs, coordination, dexterity, proprioception, sensation, ROM, strength, Fine motor control, Gross motor control, mobility, decreased knowledge of precautions, decreased knowledge of use of DME, and UE functional use, cognitive skills including, and psychosocial skills including coping strategies, environmental adaptation, interpersonal interactions, and routines and behaviors.   IMPAIRMENTS: are limiting patient from ADLs, IADLs, and leisure.   CO-MORBIDITIES: may have co-morbidities  that affects occupational performance. Patient will benefit from skilled OT to address above impairments and improve overall function.  MODIFICATION OR ASSISTANCE TO COMPLETE EVALUATION: Min-Moderate modification of tasks or assist with assess necessary to complete an evaluation.  OT OCCUPATIONAL PROFILE AND HISTORY: Detailed assessment: Review of records and additional review of physical, cognitive, psychosocial history related to current functional performance.  CLINICAL DECISION MAKING: Moderate - several treatment options, min-mod task modification necessary  REHAB POTENTIAL: Good  EVALUATION COMPLEXITY: Moderate    PLAN:  OT FREQUENCY: 2x/week  OT DURATION: 12 weeks  PLANNED INTERVENTIONS: 97535 self care/ADL training, 02889  therapeutic exercise, 97530 therapeutic activity, 97112 neuromuscular re-education, 97140 manual therapy, 97018 paraffin, 02989 moist heat, 97034 contrast bath, balance training, functional mobility training, energy conservation, coping strategies training, patient/family education, and DME and/or AE instructions  RECOMMENDED OTHER SERVICES: PT  CONSULTED AND AGREED WITH PLAN OF CARE: Patient  PLAN FOR NEXT SESSION: N/A; d/c this date  Inocente Blazing, MS, OTR/L   11/07/2023, 10:40 AM

## 2023-11-10 ENCOUNTER — Ambulatory Visit

## 2023-11-11 ENCOUNTER — Ambulatory Visit: Admitting: Physical Therapy

## 2023-11-12 ENCOUNTER — Ambulatory Visit: Admitting: Physical Therapy

## 2023-11-16 ENCOUNTER — Encounter

## 2023-11-16 ENCOUNTER — Ambulatory Visit: Admitting: Physical Therapy

## 2023-11-18 ENCOUNTER — Encounter

## 2023-11-18 ENCOUNTER — Ambulatory Visit: Admitting: Physical Therapy

## 2023-11-20 ENCOUNTER — Ambulatory Visit: Admitting: Physical Therapy

## 2023-11-23 ENCOUNTER — Ambulatory Visit: Admitting: Physical Therapy

## 2023-11-23 ENCOUNTER — Encounter

## 2023-11-26 ENCOUNTER — Ambulatory Visit: Admitting: Physical Therapy

## 2023-11-30 ENCOUNTER — Ambulatory Visit: Admitting: Physical Therapy

## 2023-11-30 ENCOUNTER — Encounter

## 2023-12-02 ENCOUNTER — Ambulatory Visit: Admitting: Physical Therapy

## 2023-12-02 ENCOUNTER — Encounter: Admitting: Occupational Therapy

## 2023-12-07 ENCOUNTER — Encounter

## 2023-12-07 ENCOUNTER — Ambulatory Visit: Admitting: Physical Therapy

## 2023-12-09 ENCOUNTER — Encounter

## 2023-12-09 ENCOUNTER — Ambulatory Visit: Admitting: Physical Therapy

## 2023-12-14 ENCOUNTER — Encounter

## 2023-12-14 ENCOUNTER — Ambulatory Visit: Admitting: Physical Therapy

## 2023-12-16 ENCOUNTER — Ambulatory Visit: Admitting: Physical Therapy

## 2023-12-16 ENCOUNTER — Encounter

## 2023-12-21 ENCOUNTER — Encounter

## 2023-12-21 ENCOUNTER — Ambulatory Visit: Admitting: Physical Therapy

## 2023-12-23 ENCOUNTER — Ambulatory Visit: Admitting: Physical Therapy

## 2023-12-23 ENCOUNTER — Encounter

## 2023-12-28 ENCOUNTER — Ambulatory Visit: Admitting: Physical Therapy

## 2023-12-28 ENCOUNTER — Encounter

## 2023-12-30 ENCOUNTER — Encounter

## 2023-12-30 ENCOUNTER — Ambulatory Visit: Admitting: Physical Therapy

## 2024-01-04 ENCOUNTER — Ambulatory Visit: Admitting: Physical Therapy

## 2024-01-04 ENCOUNTER — Encounter

## 2024-01-06 ENCOUNTER — Ambulatory Visit: Admitting: Physical Therapy

## 2024-01-06 ENCOUNTER — Encounter

## 2024-01-11 ENCOUNTER — Ambulatory Visit: Admitting: Physical Therapy

## 2024-01-11 ENCOUNTER — Encounter

## 2024-01-13 ENCOUNTER — Encounter

## 2024-01-13 ENCOUNTER — Ambulatory Visit: Admitting: Physical Therapy

## 2024-01-18 ENCOUNTER — Encounter

## 2024-01-18 ENCOUNTER — Ambulatory Visit: Admitting: Physical Therapy

## 2024-01-20 ENCOUNTER — Ambulatory Visit: Admitting: Physical Therapy

## 2024-01-20 ENCOUNTER — Encounter

## 2024-01-25 ENCOUNTER — Ambulatory Visit: Admitting: Physical Therapy

## 2024-01-25 ENCOUNTER — Encounter

## 2024-01-27 ENCOUNTER — Ambulatory Visit: Admitting: Physical Therapy

## 2024-01-27 ENCOUNTER — Encounter

## 2024-02-01 ENCOUNTER — Encounter

## 2024-02-01 ENCOUNTER — Ambulatory Visit: Admitting: Physical Therapy

## 2024-02-03 ENCOUNTER — Encounter

## 2024-02-03 ENCOUNTER — Ambulatory Visit: Admitting: Physical Therapy

## 2024-02-08 ENCOUNTER — Encounter

## 2024-02-08 ENCOUNTER — Ambulatory Visit: Admitting: Physical Therapy

## 2024-02-10 ENCOUNTER — Encounter

## 2024-02-10 ENCOUNTER — Ambulatory Visit: Admitting: Physical Therapy

## 2024-02-11 ENCOUNTER — Ambulatory Visit: Payer: Medicare Other | Admitting: Dermatology

## 2024-02-15 ENCOUNTER — Ambulatory Visit: Admitting: Physical Therapy

## 2024-02-15 ENCOUNTER — Encounter

## 2024-02-15 ENCOUNTER — Ambulatory Visit: Admitting: Dermatology

## 2024-02-17 ENCOUNTER — Ambulatory Visit: Admitting: Physical Therapy

## 2024-02-17 ENCOUNTER — Encounter

## 2024-02-22 ENCOUNTER — Ambulatory Visit: Admitting: Physical Therapy

## 2024-02-22 ENCOUNTER — Encounter

## 2024-02-24 ENCOUNTER — Ambulatory Visit: Admitting: Physical Therapy

## 2024-02-24 ENCOUNTER — Encounter

## 2024-02-29 ENCOUNTER — Ambulatory Visit: Admitting: Physical Therapy

## 2024-02-29 ENCOUNTER — Encounter

## 2024-03-07 ENCOUNTER — Encounter

## 2024-03-07 ENCOUNTER — Ambulatory Visit: Admitting: Physical Therapy

## 2024-03-09 ENCOUNTER — Ambulatory Visit: Admitting: Physical Therapy

## 2024-03-09 ENCOUNTER — Encounter

## 2024-03-14 ENCOUNTER — Ambulatory Visit: Admitting: Physical Therapy

## 2024-03-14 ENCOUNTER — Encounter

## 2024-03-16 ENCOUNTER — Ambulatory Visit: Admitting: Physical Therapy

## 2024-03-16 ENCOUNTER — Encounter

## 2024-03-18 ENCOUNTER — Ambulatory Visit: Payer: Self-pay | Admitting: Urology

## 2024-03-18 ENCOUNTER — Encounter: Payer: Self-pay | Admitting: Urology

## 2024-03-18 VITALS — BP 112/69 | HR 56 | Ht 69.0 in | Wt 160.0 lb

## 2024-03-18 DIAGNOSIS — N401 Enlarged prostate with lower urinary tract symptoms: Secondary | ICD-10-CM | POA: Diagnosis not present

## 2024-03-18 DIAGNOSIS — R3914 Feeling of incomplete bladder emptying: Secondary | ICD-10-CM | POA: Diagnosis not present

## 2024-03-18 LAB — BLADDER SCAN AMB NON-IMAGING

## 2024-03-18 NOTE — Progress Notes (Signed)
 "  03/18/2024 10:13 AM   George Bruce 1945/08/28 [id number]  Referring provider: Auston Reyes BIRCH, MD [address] [facility] [place],  [place] [address]  Chief Complaint  Patient presents with   Benign Prostatic Hypertrophy   Urologic history: 1.  Elevated PSA Prostate biopsy 03/2016; PSA 4.1; prostate volume 73 cc; benign pathology MRI prostate 06/2016 PSA 6.16; no suspicious lesions   2.  BPH with lower urinary tract symptoms Finasteride /tamsulosin  Incomplete emptying; PVR ~200 mL   3.  Erectile dysfunction Generic sildenafil    HPI: George Bruce is a 79 y.o. 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   Hypertension    Wears dentures    partial upper and lower    Surgical History: Past Surgical History:  Procedure Laterality Date   CATARACT EXTRACTION W/PHACO Right 09/29/2022   Procedure: CATARACT EXTRACTION PHACO AND INTRAOCULAR LENS PLACEMENT (IOC) RIGHT  10.98  00:56.5;  Surgeon: Myrna Adine Anes, MD;  Location: [facility];  Service: Ophthalmology;  Laterality: Right;   CATARACT EXTRACTION W/PHACO Left 10/13/2022   Procedure: CATARACT EXTRACTION PHACO AND INTRAOCULAR LENS PLACEMENT (IOC) LEFT 10.79 00:54.1;  Surgeon: Myrna Adine Anes, MD;  Location: [facility];  Service: Ophthalmology;  Laterality: Left;   COLONOSCOPY WITH PROPOFOL  N/A 04/21/2018   Procedure: COLONOSCOPY WITH PROPOFOL ;  Surgeon:  Janalyn Keene NOVAK, MD;  Location: [facility] END[facility];  Service: Endoscopy;  Laterality: N/A;   INGUINAL HERNIA REPAIR Left 1990's   INGUINAL HERNIA REPAIR Right 2015   REVERSE SHOULDER ARTHROPLASTY Left 11/06/2015   Procedure: REVERSE SHOULDER ARTHROPLASTY;  Surgeon: Norleen JINNY Maltos, MD;  Location: [facility] ORS;  Service: Orthopedics;  Laterality: Left;   REVERSE SHOULDER ARTHROPLASTY Right 03/25/2022   Procedure: REVERSE SHOULDER ARTHROPLASTY;  Surgeon: Maltos Norleen JINNY, MD;  Location: [facility] OR[facility];  Service: Orthopedics;  Laterality: Right;  MAKE 2ND CASE    Home Medications:  Allergies as of 03/18/2024       Reactions   Iodine Rash   topical        Medication List        Accurate as of March 18, 2024 10:13 AM. If you have any questions, ask your nurse or doctor.          STOP taking these medications    HYDROcodone -acetaminophen  5-325 MG tablet Commonly known as: NORCO/VICODIN Stopped by: Glendia Barba, MD       TAKE these medications    amLODipine  5 MG tablet Commonly known as: NORVASC  Take 1 tablet (5 mg total) by mouth daily.   aspirin  EC 81 MG tablet Take 1 tablet (81 mg total) by mouth daily. Swallow whole.   atorvastatin  40 MG tablet Commonly known as: LIPITOR Take 1 tablet (40 mg total) by mouth daily at 6 PM.   clopidogrel  75 MG tablet Commonly known as: PLAVIX  Take 1 tablet (75 mg total) by mouth daily. Take along with aspirin  x 3 months-after 3 months  stop Plavix  and continue on aspirin .   finasteride  5 MG tablet Commonly known as: PROSCAR  TAKE 1 TABLET BY MOUTH ONCE DAILY   multivitamin with minerals Tabs tablet Take 1 tablet by mouth daily.   pantoprazole  40 MG tablet Commonly known as: Protonix  Take 1 tablet (40 mg total) by mouth daily.   predniSONE  10 MG tablet Commonly known as: DELTASONE  TAKE 6 TABLETS ON DAY 1 AS DIRECTED ON PACKAGE AND DECREASE BY 1 TAB EACH DAY FOR A TOTAL OF 6 DAYS   tamsulosin  0.4 MG Caps capsule Commonly known as:  FLOMAX  TAKE ONE CAPSULE DAILY 30 MINS AFTER THE SAME MEAL EACH DAY.        Allergies: Allergies[1]  Family History: Family History  Problem Relation Age of Onset   Diabetes Mother     Social History:  reports that he quit smoking about 29 years ago. His smoking use included cigarettes. He has never used smokeless tobacco. He reports current alcohol use of about 7.0 standard drinks of alcohol per week. He reports that he does not use drugs.   Physical Exam: BP 112/69   Pulse (!) 56   Ht 5' 9 (1.753 m)   Wt 160 lb (72.6 kg)   BMI 23.63 kg/m   Constitutional:  Alert, No acute distress. HEENT: [place] AT Respiratory: Normal respiratory effort, no increased work of breathing. Psychiatric: Normal mood and affect.    Assessment & Plan:    1. Benign prostatic hyperplasia with incomplete bladder emptying (Primary) PVR today stable at 196 mL Continue tamsulosin /finasteride ; did not need refills at this time 1 year follow-up with PVR   Glendia JAYSON Barba, MD  [facility] [address], [address] [place], [place] [address] [phone]    [1]  Allergies Allergen Reactions   Iodine Rash    topical   "

## 2024-03-21 ENCOUNTER — Encounter

## 2024-03-21 ENCOUNTER — Ambulatory Visit: Admitting: Physical Therapy

## 2024-03-23 ENCOUNTER — Ambulatory Visit: Admitting: Physical Therapy

## 2024-03-23 ENCOUNTER — Encounter

## 2024-03-28 ENCOUNTER — Ambulatory Visit: Admitting: Physical Therapy

## 2024-03-28 ENCOUNTER — Encounter

## 2024-04-18 ENCOUNTER — Ambulatory Visit: Admitting: Dermatology

## 2025-03-17 ENCOUNTER — Ambulatory Visit: Admitting: Urology
